# Patient Record
Sex: Male | Born: 1935 | Race: White | Hispanic: No | Marital: Married | Smoking: Former smoker
Health system: Southern US, Community
[De-identification: ages and names within clinical notes are randomized; demographics above are authoritative.]

## PROBLEM LIST (undated history)

## (undated) DIAGNOSIS — Z973 Presence of spectacles and contact lenses: Secondary | ICD-10-CM

## (undated) DIAGNOSIS — I2510 Atherosclerotic heart disease of native coronary artery without angina pectoris: Secondary | ICD-10-CM

## (undated) DIAGNOSIS — I493 Ventricular premature depolarization: Secondary | ICD-10-CM

## (undated) DIAGNOSIS — F0390 Unspecified dementia without behavioral disturbance: Secondary | ICD-10-CM

## (undated) DIAGNOSIS — R399 Unspecified symptoms and signs involving the genitourinary system: Secondary | ICD-10-CM

## (undated) DIAGNOSIS — E782 Mixed hyperlipidemia: Secondary | ICD-10-CM

## (undated) DIAGNOSIS — I4510 Unspecified right bundle-branch block: Secondary | ICD-10-CM

## (undated) DIAGNOSIS — N400 Enlarged prostate without lower urinary tract symptoms: Secondary | ICD-10-CM

## (undated) DIAGNOSIS — E039 Hypothyroidism, unspecified: Secondary | ICD-10-CM

## (undated) DIAGNOSIS — H409 Unspecified glaucoma: Secondary | ICD-10-CM

## (undated) DIAGNOSIS — R195 Other fecal abnormalities: Secondary | ICD-10-CM

## (undated) DIAGNOSIS — M1990 Unspecified osteoarthritis, unspecified site: Secondary | ICD-10-CM

## (undated) DIAGNOSIS — I252 Old myocardial infarction: Secondary | ICD-10-CM

## (undated) DIAGNOSIS — I10 Essential (primary) hypertension: Secondary | ICD-10-CM

## (undated) HISTORY — PX: TONSILLECTOMY: SUR1361

## (undated) HISTORY — DX: Essential (primary) hypertension: I10

## (undated) HISTORY — DX: Benign prostatic hyperplasia without lower urinary tract symptoms: N40.0

## (undated) HISTORY — DX: Mixed hyperlipidemia: E78.2

## (undated) HISTORY — PX: CATARACT EXTRACTION W/ INTRAOCULAR LENS  IMPLANT, BILATERAL: SHX1307

## (undated) HISTORY — PX: CARDIOVASCULAR STRESS TEST: SHX262

## (undated) HISTORY — PX: CARDIAC CATHETERIZATION: SHX172

---
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
## 2007-06-19 ENCOUNTER — Encounter: Payer: Self-pay | Admitting: Cardiology

## 2007-07-20 ENCOUNTER — Ambulatory Visit: Payer: Self-pay | Admitting: Cardiology

## 2007-07-31 ENCOUNTER — Ambulatory Visit: Payer: Self-pay | Admitting: Cardiology

## 2007-08-18 ENCOUNTER — Ambulatory Visit: Payer: Self-pay | Admitting: Cardiology

## 2007-08-21 ENCOUNTER — Encounter: Payer: Self-pay | Admitting: Cardiology

## 2007-08-23 ENCOUNTER — Ambulatory Visit: Payer: Self-pay | Admitting: Cardiovascular Disease

## 2007-08-23 ENCOUNTER — Inpatient Hospital Stay (HOSPITAL_BASED_OUTPATIENT_CLINIC_OR_DEPARTMENT_OTHER): Admission: RE | Admit: 2007-08-23 | Discharge: 2007-08-23 | Payer: Self-pay | Admitting: Cardiology

## 2007-09-12 ENCOUNTER — Ambulatory Visit: Payer: Self-pay | Admitting: Cardiology

## 2008-10-23 DIAGNOSIS — R9439 Abnormal result of other cardiovascular function study: Secondary | ICD-10-CM | POA: Insufficient documentation

## 2008-10-23 DIAGNOSIS — R072 Precordial pain: Secondary | ICD-10-CM | POA: Insufficient documentation

## 2008-10-23 DIAGNOSIS — R0602 Shortness of breath: Secondary | ICD-10-CM | POA: Insufficient documentation

## 2008-10-24 ENCOUNTER — Encounter (INDEPENDENT_AMBULATORY_CARE_PROVIDER_SITE_OTHER): Payer: Self-pay | Admitting: *Deleted

## 2010-07-14 NOTE — Assessment & Plan Note (Signed)
[facility]                          [facility] NOTE   NAME:Kurt French, Kurt French                      MRN:          [id number]  DATE:09/12/2007                            DOB:          April 14, 1932    PRIMARY CARE PHYSICIAN:  Doreen Beam, MD   REASON FOR VISIT:  Followup cardiac catheterization.   HISTORY OF PRESENT ILLNESS:  I saw [name] in mid June.  I referred  him for a diagnostic cardiac catheterization with a history of dyspnea  on exertion, functional limitation, and episodic chest discomfort.  He  had had an abnormal Cardiolite indicating possible inferolateral scar  with ischemia and ejection fraction of 53%.  Fortunately, his cardiac  catheterization actually looks quite good.  He had no significant  obstructive coronary artery disease and a normal left ventricular  ejection fraction of 65%.  His left ventricular end-diastolic pressure  was 20 mmHg.  I reviewed these results with him today and provided  reassurance.  He clearly needs continued risk factor modification, and I  have recommended that he continue his aspirin and treatment for  hypertension.  He is also fairly active at baseline, and we talked about  continuing this.  His chest x-ray did suggest some emphysematous  changes, although he had no defined history of tobacco use.  My  understanding is that he had pulmonary function tests ordered by Dr.  Sherril Croon demonstrating mild COPD.  This may have some bearing on his  symptoms, and he also indicates a propensity to allergies which may also  be related.   ALLERGIES:  No known drug allergies.   PRESENT MEDICATIONS:  1. Aspirin 325 mg p.o. daily.  2. Multivitamin 1 p.o. daily.  3. Lisinopril/HCTZ 20/12.5 mg p.o. daily.   REVIEW OF SYSTEMS:  As per history of present illness.  Otherwise  negative.   PHYSICAL EXAMINATION:  VITAL SIGNS:  Blood pressure is 131/89, heart  rate is 61, and weight is 203 pounds.  GENERAL:  The  patient is comfortable and in no acute distress.  HEENT:  Conjunctiva is normal.  Oropharynx is clear.  NECK:  Supple.  No elevated jugular venous pressure.  LUNGS:  Clear without breathing.  CARDIAC:  Regular rate and rhythm.  No loud murmur or gallop.  EXTREMITIES:  No pitting edema or hematoma postcatheterization.   IMPRESSION AND RECOMMENDATIONS:  Dyspnea on exertion with subsequent  findings of no significant obstructive coronary artery disease at  catheterization and normal left ventricular systolic function with a  left ventricular end-diastolic pressure of 20 mmHg.  We would suggest  continuing risk factor modification and also medical therapy including  aspirin.  Would continue efforts at blood pressure control and also  follow lipids aiming for aggressive LDL control at least under 100.  I  have recommended that he continue regular exercise and maintain followup  with Dr. Sherril Croon.  There may be a pulmonary component to his  symptomatology.  If this does not clearly pan out to be the case, one  could consider a cardiopulmonary stress test.  At this point, cardiology  followup  will be p.r.n.     Jonelle Sidle, MD  Electronically Signed    SGM/MedQ  DD: 09/12/2007  DT: 09/13/2007  Job #: [id number]   cc:   Doreen Beam, MD

## 2010-07-14 NOTE — Cardiovascular Report (Signed)
NAMEALAMIN, MCCUISTON               ACCOUNT NO.:  [id number]   MEDICAL RECORD NO.:  [id number]          PATIENT TYPE:  OIB   LOCATION:  1962                         FACILITY:  [facility]   PHYSICIAN:  Veverly Fells. Excell Seltzer, MD  DATE OF BIRTH:  [birth year]   DATE OF PROCEDURE:  08/23/2007  DATE OF DISCHARGE:  08/23/2007                            CARDIAC CATHETERIZATION   PROCEDURE:  Left heart catheterization, selective coronary angiography,  and left ventricular angiography.   INDICATIONS:  Mr. Speigner is a 77 year old gentleman with chest  discomfort and exertional dyspnea.  He underwent a Myoview scan that  showed a large partially reversible apical and basal inferolateral wall  defect consistent with scar and ischemia.  He was referred for cardiac  cath.   Risks and indications of the procedure were reviewed with the patient  and informed consent was obtained.  The right groin was prepped, draped,  and anesthetized with 1% lidocaine.  Using the modified Seldinger  technique, a 4-French sheath was placed in the right femoral artery.  Standard 4-French Judkins catheters were used for coronary angiography  and left ventriculography.  Pullback across the aortic valve was done.  All catheter exchanges were performed over a guidewire.  There were no  immediate complications.  The patient tolerated the procedure well.   FINDINGS:  Aortic pressure 124/63 with a mean of 88, left ventricular  pressure is 128/20.   Left mainstem.  The left main is angiographically normal.  It bifurcates  into the LAD and left circumflex.   LAD.  The LAD is a large-caliber vessel that courses down and reaches  the LV apex.  It supplies two diagonal branches, first which is moderate-  sized, the second diagonal is a large vessel that is nearly the size of  the LAD.  There was mild nonobstructive plaque but no significant  disease seen throughout the LAD or diagonal branches.   The left circumflex.  The left  circumflex is a large vessel.  It courses  down and supplies a tiny first OM and a large second OM branch, also  supplies a posterolateral branch and a left PDA.  There is no  significant stenosis throughout the left circumflex system.   Right coronary artery.  The right coronary artery is small and  nondominant.  It supplies a single RV marginal branch and the vessel is  angiographically normal.   Left ventriculography.  LV function is normal.  The LVEF is estimated at  65%.  There is no significant mitral regurgitation.  There are no  regional wall motion abnormalities.   ASSESSMENT:  1. Essentially normal coronary arteries with no obstructive coronary      artery disease.  2. Normal left ventricular function.   PLAN:  Ongoing efforts at primary risk reduction.      Veverly Fells. Excell Seltzer, MD  Electronically Signed     MDC/MEDQ  D:  11/17/2007  T:  11/18/2007  Job:  [id number]

## 2010-07-14 NOTE — Assessment & Plan Note (Signed)
[facility]                          [facility]ICE NOTE   NAME:Upson, DEWELL MONNIER                      MRN:          [id number]  DATE:07/20/2007                            DOB:          24-Aug-1932    REQUESTING PHYSICIAN:  Doreen Beam, MD.   REASON FOR CONSULTATION:  Dyspnea on exertion and episodic chest pain.   HISTORY OF PRESENT ILLNESS:  Mr. Methot is a very pleasant 77 year old  gentleman with a history of hypertension and very remote history of  tobacco use.  He is retired from the Chiropractor business  and has been fairly active over his lifetime.  He has been a regular  runner, in fact has run several marathons (PR around 4:15) his last  being in 2003.  He continues to exercise at the [facility] walking on the  treadmill and also running approximately 3 miles outside at a time,  maybe 2 times a week.  He was evaluated at [facility] in the past  due to bradycardia which ultimately turned out to be due to the fact  that he was in fairly good shape.  It sounds as if he had an  echocardiogram and treadmill as well as a Holter monitor and had no  other need for further intervention.  He is describing a history of  dyspnea on exertion that became noticeable back in January of this year.  He states specifically with activities such as walking uphill on his  property or carrying wood 30 feet, he feels more short of breath than  usual.  Sometimes, he has a pressure in the right upper side of his  chest in the mornings, but this is not exertional.  Otherwise, he is  able to do his other typical exercises without limitation.  He had a  chest x-ray done recently which demonstrated mildly hyperinflated lungs  that were described as being consistent with emphysema, although no  other acute cardiopulmonary process was noted.  I note that he stopped  smoking cigarettes back in the 1960s and he has no problems with  wheezing or cough.  He  states that he is remodeling a house and is  around a lot of dust.  He had an echocardiogram obtained also recently  demonstrating a normal left ventricular ejection fraction of 60% with  mild mitral regurgitation and mildly thickened aortic valve, although  without any significant stenosis.  He otherwise is not having any major  complaints.  His electrocardiogram shows sinus bradycardia at 53 beats  per minute with nonspecific T-wave changes.   ALLERGIES:  NO KNOWN DRUG ALLERGIES.   MEDICATIONS:  1. Lisinopril/hydrochlorothiazide 25/12.5 mg p.o. daily.  2. He also uses glucosamine chondroitin.   PAST MEDICAL HISTORY:  As outlined above.  He is status post  appendectomy back in 1948.   REVIEW OF SYSTEMS:  As outlined above.  He has problems with previous  gastric ulcer, although not since the 1980s.  He does have arthritic  knee pain.  He has a history of anxiety and depression.  No  claudication.  Otherwise systems are negative.  FAMILY HISTORY:  Reviewed.  The patient's mother died in her 56s with  cancer.  The patient's father died in his late 47s following an  accident.  No obvious premature cardiovascular disease noted.   SOCIAL HISTORY:  The patient is retired from Chiropractor.  He has a remote tobacco use history, but quit in the 1960s.  Remote  history of recreational drug use in the 1970s.  No regular alcohol use.  Drinks 6 cups of coffee a day.  Exercises regularly.  He is divorced and  has 3  children.  He grew up in [place].   PHYSICAL EXAMINATION:  VITAL SIGNS:  Blood pressure 145/84, heart rate  is 56, weight is 209 pounds, oxygen saturation is 98% on room air.  GENERAL:  This is an overweight male in no acute distress.  HEENT:  Conjunctivae are normal.  Oropharynx is clear.  NECK:  Supple.  No elevated jugular venous pressure.  No loud bruits.  No thyromegaly is noted.  LUNGS:  Clear without labored breathing at rest.  CARDIAC:  Reveals a regular rate  and rhythm.  No pathologic murmurs.  No  S3 gallop or pericardial rub.  ABDOMEN:  Soft, nontender, normoactive bowel sounds.  No bruits.  EXTREMITIES:  Exhibit no frank pitting edema.  Distal pulses are 2+.  SKIN:  Warm and dry.  MUSCULOSKELETAL:  No kyphosis noted.  NEUROPSYCHIATRIC:  The patient is alert and oriented x3.  Affect is  appropriate.   IMPRESSION/RECOMMENDATIONS:  Dyspnea on exertion, noticeable at  increased levels of activity such as walking up an incline or carrying  logs.  Otherwise, typical activities do not provoke symptoms.  The  patient has been active in general over his lifetime and has been fit to  the level of running marathons on a regular basis, recently in 2003.  He  continues to exercise now without major limitations.  His  electrocardiogram shows sinus bradycardia with nonspecific T-wave  changes.  He does have a remote tobacco use history.  His chest x-ray  shows mild hyperinflation with the possibility of emphysema being  raised, although he is not having any frank wheezing, hypoxia on room  air or cough.  His recent echocardiogram demonstrates normal left  ventricular systolic function with no major valvular abnormalities.  We  discussed these issues today and options for further evaluation  including both noninvasive and invasive techniques.  At this point, our  plan will be to proceed with an exercise Cardiolite and then decide if  further testing is needed beyond this.  If his ischemic assessment is  reassuring, formal pulmonary function tests or a CPX might be the next  step.  I will have him come back over the next month for evaluation.     Jonelle Sidle, MD  Electronically Signed    SGM/MedQ  DD: 07/20/2007  DT: 07/20/2007  Job #: [id number]   cc:   Doreen Beam, MD

## 2010-07-14 NOTE — Assessment & Plan Note (Signed)
[facility]                          [facility]ICE NOTE   NAME:Kurt French, Kurt French                      MRN:          [id number]  DATE:08/18/2007                            DOB:          09-02-1932    PRIMARY CARE PHYSICIAN:  Doreen Beam, MD   REASON FOR VISIT:  Followup cardiac testing.   HISTORY OF PRESENT ILLNESS:  I just recently saw Kurt French back in  late May.  He presented at that time with a history of progressive  dyspnea on exertion as well as episodic chest discomfort.  He has been  fairly active in general.  In fact, has run marathons in the past, most  recently in 2003, and had noted a limitation in his functional capacity  in the setting of hypertension and a chest x-ray showing the possibility  of a mild emphysematous change with remote tobacco use in the 1960s.  I  referred him for an exercise Cardiolite, which revealed no diagnostic  electrocardiographic changes, an ejection fraction of 53%, and a large  partially reversible apical to basal inferolateral defect consistent  with scar and ischemia.  I reviewed these results with him today and we  discussed proceeding on to a diagnostic cardiac catheterization to  better understand his coronary anatomy and assess coronary  revascularization options.  We reviewed the potential risks and benefits  of this approach and he is in agreement to proceed.   ALLERGIES:  No known drug allergies.   CURRENT MEDICATIONS:  1. Aspirin 325 p.o. daily.  2. Multivitamin 1 p.o. daily.  3. Glucosamine/chondroitin p.r.n.  4. Lisinopril/HCTZ 20/12.5 mg p.o. daily.   REVIEW OF SYSTEMS:  As per history of present illness.   PHYSICAL EXAMINATION:  VITAL SIGNS:  Blood pressure is 129/81, heart  rate is 53, and weight is 202 pounds.  GENERAL:  Patient is comfortable and in no acute distress.  HEENT:  Conjunctival is normal.  Oropharynx is clear.  NECK:  Supple.  No elevated jugular venous pressure  or bruits.  LUNGS:  Clear without labored breathing. Somewhat diminished breath  sounds.  No wheezing.  CARDIAC:  Regular rate and rhythm.  No S3 gallop or pathologic murmur.  EXTREMITIES:  No significant pitting edemas.   IMPRESSION AND RECOMMENDATIONS:  Progressive history of dyspnea on  exertion, functional limitation, an episodic chest discomfort with an  abnormal Cardiolite indicating apparent inferolateral scar with  associated ischemia and an ejection fraction of 53%.  This is on a  baseline of hypertension and remote tobacco use.  Lipid status is not  known.  After reviewing the test results, we discussed proceeding on to  a diagnostic cardiac catheterization, which will be arranged next week  in our outpatient cardiac catheterization lab.  We reviewed the  potential risk and benefits and he agreed to proceed.  Baseline labs  will be obtained.  He had a recent chest x-ray done demonstrating  possible mild emphysematous changes/hyperinflation, but no other acute  change.     Jonelle Sidle, MD  Electronically Signed    SGM/MedQ  DD: 08/18/2007  DT:  08/19/2007  Job #: [id number]   cc:   Doreen Beam, MD

## 2012-11-06 ENCOUNTER — Encounter: Payer: Self-pay | Admitting: Cardiology

## 2012-12-07 ENCOUNTER — Encounter: Payer: Self-pay | Admitting: *Deleted

## 2012-12-07 ENCOUNTER — Encounter: Payer: Self-pay | Admitting: Cardiology

## 2012-12-08 ENCOUNTER — Ambulatory Visit (INDEPENDENT_AMBULATORY_CARE_PROVIDER_SITE_OTHER): Payer: Self-pay | Admitting: Cardiology

## 2012-12-08 ENCOUNTER — Encounter: Payer: Self-pay | Admitting: Cardiology

## 2012-12-08 VITALS — BP 117/78 | HR 56 | Ht 69.0 in | Wt 197.1 lb

## 2012-12-08 DIAGNOSIS — I10 Essential (primary) hypertension: Secondary | ICD-10-CM

## 2012-12-08 DIAGNOSIS — R072 Precordial pain: Secondary | ICD-10-CM

## 2012-12-08 DIAGNOSIS — R9439 Abnormal result of other cardiovascular function study: Secondary | ICD-10-CM

## 2012-12-08 MED ORDER — ASPIRIN EC 325 MG PO TBEC: 325.0000 mg | DELAYED_RELEASE_TABLET | Freq: Every day | ORAL | Status: DC

## 2012-12-08 MED ORDER — NITROGLYCERIN 0.4 MG SL SUBL: 0.4000 mg | SUBLINGUAL_TABLET | SUBLINGUAL | Status: DC | PRN

## 2012-12-08 NOTE — Assessment & Plan Note (Signed)
Resolved. No ACS by recent enzymes. Followup Cardiolite during hospitalization showed possible inferior scar, however no active ischemia with normal LVEF. He is not reporting any further exertional symptomatology. As noted above, plan will be medical therapy and observation. I have recommended a daily aspirin in addition to his Tenoretic, also provided prescription for nitroglycerin. We will see him back in the next 3 months, sooner if needed.

## 2012-12-08 NOTE — Patient Instructions (Addendum)
   Begin Aspirin 325mg  daily  Nitroglycerin as needed for severe chest pain only - new sent to pharm  Continue all other medications.   Follow up in  3 months

## 2012-12-08 NOTE — Progress Notes (Signed)
Clinical Summary Kurt French is a 79 y.o.male last seen in our office back in July 2009. He has a history of reassuring cardiac catheterization that year demonstrating no significant obstructive CAD. Record review finds hospitalization at [facility] in September, patient was seen by the [facility] practice in consultation. He underwent a followup exercise Cardiolite demonstrating no diagnostic ST segment abnormalities, fixed inferior defect suggestive of scar, no active ischemia, LVEF 64%. Not entirely clear what was decided in terms of management based on this study result in reviewing  the records.  He is here with significant other today. He denies having any further chest pain symptoms since discharge. His original episode occurred after doing yard work over a period of 3 days, beginning to develop chest tightness, also reportedly had a high blood pressure recording at home.  I reviewed the results of the stress test with the patient, discussed the implications. He had no active ischemia, possible inferior scar, although back in 2009 also had abnormal stress testing that was not corroborated by cardiac catheterization. We discussed options of either continuing medical therapy and observation, versus pursuing a cardiac catheterization. At this point he is most comfortable with observation.   No Known Allergies  Current Outpatient Prescriptions  Medication Sig Dispense Refill  . atenolol-chlorthalidone (TENORETIC) 100-25 MG per tablet Take 1 tablet by mouth daily.      Marland Kitchen aspirin EC 325 MG tablet Take 1 tablet (325 mg total) by mouth daily.      . nitroGLYCERIN (NITROSTAT) 0.4 MG SL tablet Place 1 tablet (0.4 mg total) under the tongue every 5 (five) minutes as needed for chest pain.  25 tablet  3   No current facility-administered medications for this visit.    Past Medical History  Diagnosis Date  . History of cardiac catheterization     No significant CAD 2009  . BPH (benign  prostatic hypertrophy)   . Mixed hyperlipidemia   . Essential hypertension, benign     Past Surgical History  Procedure Laterality Date  . Appendectomy  1948  . Tonsillectomy      Family History  Problem Relation Age of Onset  . Colon cancer Mother   . Other Father     MVA    Social History Kurt French reports that he quit smoking about 52 years ago. His smoking use included Cigarettes. He has a 30 pack-year smoking history. He does not have any smokeless tobacco history on file. Kurt French reports that he does not drink alcohol.  Review of Systems Head no palpitations or syncope. NYHA class II dyspnea. Good appetite. No orthopnea or PND. Otherwise negative.  Physical Examination Filed Vitals:   12/08/12 1347  BP: 117/78  Pulse: 56   Filed Weights   12/08/12 1347  Weight: 197 lb 1.9 oz (89.413 kg)   Patient appears comfortable at rest. HEENT: Conjunctiva and lids normal, oropharynx clear. Neck: Supple, no elevated JVP or carotid bruits, no thyromegaly. Lungs: Clear to auscultation, nonlabored breathing at rest. Cardiac: Regular rate and rhythm, no S3, soft systolic murmur, no pericardial rub. Abdomen: Soft, nontender, bowel sounds present. Extremities: No pitting edema, distal pulses 2+. Skin: Warm and dry. Musculoskeletal: No kyphosis. Neuropsychiatric: Alert and oriented x3, affect grossly appropriate.   Problem List and Plan   Precordial pain Resolved. No ACS by recent enzymes. Followup Cardiolite during hospitalization showed possible inferior scar, however no active ischemia with normal LVEF. He is not reporting any further exertional symptomatology. As noted above, plan  will be medical therapy and observation. I have recommended a daily aspirin in addition to his Tenoretic, also provided prescription for nitroglycerin. We will see him back in the next 3 months, sooner if needed.  ABNORMAL CV (STRESS) TEST Also noted 2009, cardiac catheterization at that  time did not demonstrate any significant CAD.  Essential hypertension, benign Blood pressure is normal today.    Jonelle Sidle, M.D., F.A.C.C.

## 2012-12-08 NOTE — Assessment & Plan Note (Signed)
Blood pressure is normal today. 

## 2012-12-08 NOTE — Assessment & Plan Note (Signed)
Also noted 2009, cardiac catheterization at that time did not demonstrate any significant CAD.

## 2013-03-02 ENCOUNTER — Telehealth: Payer: Self-pay

## 2013-03-02 NOTE — Telephone Encounter (Signed)
Ms. Clearance Coots called on behalf of patient stating patient is a self-pay and won't have insurance until 08/2013.  Patient is not wanting to keep appointment coming up on 04/25/13 at 1:20.  Her question was price of visits and seemed high.  Patient just now paying off 1st visit from Oct 2014. Advised to keep appointment, explained about discount offered however she is stating it is too much.  Advised I would share concerns with physician.

## 2013-03-08 ENCOUNTER — Ambulatory Visit: Payer: Self-pay | Admitting: Cardiology

## 2013-04-25 ENCOUNTER — Encounter: Payer: Self-pay | Admitting: Cardiology

## 2013-04-25 ENCOUNTER — Ambulatory Visit (INDEPENDENT_AMBULATORY_CARE_PROVIDER_SITE_OTHER): Payer: Self-pay | Admitting: Cardiology

## 2013-04-25 VITALS — BP 124/73 | HR 46 | Ht 69.0 in | Wt 211.8 lb

## 2013-04-25 DIAGNOSIS — R9439 Abnormal result of other cardiovascular function study: Secondary | ICD-10-CM

## 2013-04-25 DIAGNOSIS — I10 Essential (primary) hypertension: Secondary | ICD-10-CM

## 2013-04-25 NOTE — Progress Notes (Signed)
    Clinical Summary Kurt French is a 80 y.o.male last seen in October 2014. Kurt French is here with Kurt French wife today. States that Kurt French has had no chest pain or unusual shortness of breath in the interim. Seems to be tolerating Kurt French medicines well. Kurt French has not used any nitroglycerin.  We have been managing him medically. Exercise Cardiolite in September 2014 demonstrated possible inferior scar with no ischemia, LVEF 64%. With similar noninvasive testing back in 2009, cardiac catheterization did not confirm any obstructive CAD.   No Known Allergies  Current Outpatient Prescriptions  Medication Sig Dispense Refill  . aspirin EC 325 MG tablet Take 325 mg by mouth daily.      Marland Kitchen atenolol-chlorthalidone (TENORETIC) 100-25 MG per tablet Take 1 tablet by mouth daily.      . nitroGLYCERIN (NITROSTAT) 0.4 MG SL tablet Place 1 tablet (0.4 mg total) under the tongue every 5 (five) minutes as needed for chest pain.  25 tablet  3   No current facility-administered medications for this visit.    Past Medical History  Diagnosis Date  . History of cardiac catheterization     No significant CAD 2009  . BPH (benign prostatic hypertrophy)   . Mixed hyperlipidemia   . Essential hypertension, benign     Social History Kurt French reports that Kurt French quit smoking about 53 years ago. Kurt French smoking use included Cigarettes. Kurt French has a 30 pack-year smoking history. Kurt French does not have any smokeless tobacco history on file. Kurt French reports that Kurt French does not drink alcohol.  Review of Systems No dizziness or syncope. No orthopnea or PND. No claudication. Otherwise negative.  Physical Examination Filed Vitals:   04/25/13 1325  BP: 124/73  Pulse: 46   Filed Weights   04/25/13 1325  Weight: 211 lb 12.8 oz (96.072 kg)    Patient appears comfortable at rest.  HEENT: Conjunctiva and lids normal, oropharynx clear.  Neck: Supple, no elevated JVP or carotid bruits, no thyromegaly.  Lungs: Clear to auscultation, nonlabored  breathing at rest.  Cardiac: Regular rate and rhythm, no S3, soft systolic murmur, no pericardial rub.  Abdomen: Soft, nontender, bowel sounds present.  Extremities: No pitting edema, distal pulses 2+.    Problem List and Plan   ABNORMAL CV (STRESS) TEST Low risk as outlined above, prior history of no significant CAD by cardiac catheterization in 2009. Would continue medical therapy and observation for now.  Essential hypertension, benign Blood pressure is normal today.    Satira Sark, M.D., F.A.C.C.

## 2013-04-25 NOTE — Assessment & Plan Note (Signed)
Blood pressure is normal today. 

## 2013-04-25 NOTE — Assessment & Plan Note (Signed)
Low risk as outlined above, prior history of no significant CAD by cardiac catheterization in 2009. Would continue medical therapy and observation for now.

## 2013-04-25 NOTE — Patient Instructions (Signed)

## 2013-12-03 ENCOUNTER — Encounter (INDEPENDENT_AMBULATORY_CARE_PROVIDER_SITE_OTHER): Payer: Medicare Other | Admitting: Ophthalmology

## 2013-12-03 DIAGNOSIS — H35033 Hypertensive retinopathy, bilateral: Secondary | ICD-10-CM | POA: Diagnosis not present

## 2013-12-03 DIAGNOSIS — I10 Essential (primary) hypertension: Secondary | ICD-10-CM

## 2013-12-03 DIAGNOSIS — H35372 Puckering of macula, left eye: Secondary | ICD-10-CM | POA: Diagnosis not present

## 2013-12-03 DIAGNOSIS — H3531 Nonexudative age-related macular degeneration: Secondary | ICD-10-CM

## 2013-12-03 DIAGNOSIS — H43813 Vitreous degeneration, bilateral: Secondary | ICD-10-CM

## 2013-12-26 ENCOUNTER — Other Ambulatory Visit (HOSPITAL_COMMUNITY): Payer: Self-pay | Admitting: General Surgery

## 2013-12-26 ENCOUNTER — Ambulatory Visit (HOSPITAL_COMMUNITY)
Admission: RE | Admit: 2013-12-26 | Discharge: 2013-12-26 | Disposition: A | Discharge status: Home / Self Care | Payer: Medicare Other | Source: Ambulatory Visit | Attending: General Surgery | Admitting: General Surgery

## 2013-12-26 DIAGNOSIS — M179 Osteoarthritis of knee, unspecified: Secondary | ICD-10-CM | POA: Insufficient documentation

## 2013-12-26 DIAGNOSIS — T1490XA Injury, unspecified, initial encounter: Secondary | ICD-10-CM

## 2013-12-26 DIAGNOSIS — M25562 Pain in left knee: Secondary | ICD-10-CM | POA: Diagnosis present

## 2015-08-05 DIAGNOSIS — H401123 Primary open-angle glaucoma, left eye, severe stage: Secondary | ICD-10-CM | POA: Diagnosis not present

## 2015-08-12 DIAGNOSIS — R3129 Other microscopic hematuria: Secondary | ICD-10-CM | POA: Diagnosis not present

## 2015-08-12 DIAGNOSIS — R39198 Other difficulties with micturition: Secondary | ICD-10-CM | POA: Diagnosis not present

## 2015-09-08 DIAGNOSIS — Z79899 Other long term (current) drug therapy: Secondary | ICD-10-CM | POA: Diagnosis not present

## 2015-09-08 DIAGNOSIS — M79605 Pain in left leg: Secondary | ICD-10-CM | POA: Diagnosis not present

## 2015-09-08 DIAGNOSIS — Z87891 Personal history of nicotine dependence: Secondary | ICD-10-CM | POA: Diagnosis not present

## 2015-09-08 DIAGNOSIS — M25562 Pain in left knee: Secondary | ICD-10-CM | POA: Diagnosis not present

## 2015-09-08 DIAGNOSIS — I10 Essential (primary) hypertension: Secondary | ICD-10-CM | POA: Diagnosis not present

## 2015-09-11 DIAGNOSIS — H409 Unspecified glaucoma: Secondary | ICD-10-CM | POA: Diagnosis not present

## 2015-09-11 DIAGNOSIS — M1612 Unilateral primary osteoarthritis, left hip: Secondary | ICD-10-CM | POA: Diagnosis not present

## 2015-09-11 DIAGNOSIS — M1712 Unilateral primary osteoarthritis, left knee: Secondary | ICD-10-CM | POA: Diagnosis not present

## 2015-09-11 DIAGNOSIS — N400 Enlarged prostate without lower urinary tract symptoms: Secondary | ICD-10-CM | POA: Diagnosis not present

## 2015-09-11 DIAGNOSIS — E039 Hypothyroidism, unspecified: Secondary | ICD-10-CM | POA: Diagnosis not present

## 2015-09-12 DIAGNOSIS — M47896 Other spondylosis, lumbar region: Secondary | ICD-10-CM | POA: Diagnosis not present

## 2015-09-12 DIAGNOSIS — M1712 Unilateral primary osteoarthritis, left knee: Secondary | ICD-10-CM | POA: Diagnosis not present

## 2015-09-12 DIAGNOSIS — M1612 Unilateral primary osteoarthritis, left hip: Secondary | ICD-10-CM | POA: Diagnosis not present

## 2015-09-15 DIAGNOSIS — H401123 Primary open-angle glaucoma, left eye, severe stage: Secondary | ICD-10-CM | POA: Diagnosis not present

## 2015-09-15 DIAGNOSIS — H401113 Primary open-angle glaucoma, right eye, severe stage: Secondary | ICD-10-CM | POA: Diagnosis not present

## 2015-10-27 DIAGNOSIS — E559 Vitamin D deficiency, unspecified: Secondary | ICD-10-CM | POA: Diagnosis not present

## 2015-10-27 DIAGNOSIS — Z0000 Encounter for general adult medical examination without abnormal findings: Secondary | ICD-10-CM | POA: Diagnosis not present

## 2015-10-29 DIAGNOSIS — Z0001 Encounter for general adult medical examination with abnormal findings: Secondary | ICD-10-CM | POA: Diagnosis not present

## 2015-10-29 DIAGNOSIS — M1612 Unilateral primary osteoarthritis, left hip: Secondary | ICD-10-CM | POA: Diagnosis not present

## 2015-10-29 DIAGNOSIS — Z23 Encounter for immunization: Secondary | ICD-10-CM | POA: Diagnosis not present

## 2015-11-07 DIAGNOSIS — R931 Abnormal findings on diagnostic imaging of heart and coronary circulation: Secondary | ICD-10-CM | POA: Diagnosis not present

## 2015-11-07 DIAGNOSIS — M4806 Spinal stenosis, lumbar region: Secondary | ICD-10-CM | POA: Diagnosis not present

## 2015-11-07 DIAGNOSIS — M4696 Unspecified inflammatory spondylopathy, lumbar region: Secondary | ICD-10-CM | POA: Diagnosis not present

## 2015-11-07 DIAGNOSIS — R079 Chest pain, unspecified: Secondary | ICD-10-CM | POA: Diagnosis not present

## 2015-11-07 DIAGNOSIS — M5126 Other intervertebral disc displacement, lumbar region: Secondary | ICD-10-CM | POA: Diagnosis not present

## 2015-11-17 ENCOUNTER — Encounter: Payer: Self-pay | Admitting: Cardiovascular Disease

## 2015-11-17 ENCOUNTER — Ambulatory Visit (INDEPENDENT_AMBULATORY_CARE_PROVIDER_SITE_OTHER): Payer: Medicare Other | Admitting: Cardiovascular Disease

## 2015-11-17 ENCOUNTER — Ambulatory Visit (HOSPITAL_COMMUNITY)
Admission: RE | Admit: 2015-11-17 | Discharge: 2015-11-17 | Disposition: A | Discharge status: Home / Self Care | Payer: Medicare Other | Source: Ambulatory Visit | Attending: Cardiovascular Disease | Admitting: Cardiovascular Disease

## 2015-11-17 VITALS — BP 136/73 | HR 52 | Ht 65.0 in | Wt 219.0 lb

## 2015-11-17 DIAGNOSIS — I4891 Unspecified atrial fibrillation: Secondary | ICD-10-CM | POA: Diagnosis not present

## 2015-11-17 DIAGNOSIS — I10 Essential (primary) hypertension: Secondary | ICD-10-CM | POA: Diagnosis not present

## 2015-11-17 DIAGNOSIS — R0602 Shortness of breath: Secondary | ICD-10-CM | POA: Diagnosis not present

## 2015-11-17 DIAGNOSIS — R9439 Abnormal result of other cardiovascular function study: Secondary | ICD-10-CM

## 2015-11-17 NOTE — Progress Notes (Signed)
SUBJECTIVE: 82 yr old patient of Dr. Domenic Polite last seen by him in 2015. Has HTN and no significant CAD by cath on  08/23/2007.   Has been having progressive exertional dyspnea, and no pulmonary problems. PCP ordered a stress test. Pt denies having had a chest xray.  Underwent stress test (I do not have the ECG strips) which reportedly showed atrial fibrillation with a RBBB and PVC's. 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 There was mild reversibility in the inferior wall concerning for ischemia.  Labs BUN 22, creatinine 1.26, Hgb 14.3, TC 154, TG 278, HDL 35, LDL 64.  ECG performed in the office today which I personally interpreted demonstrated sinus rhythm with a nonspecific T wave abnormality and late R-wave progression. There was also a left anterior fascicular block.   Review of Systems: As per "subjective", otherwise negative.  No Known Allergies  Current Outpatient Prescriptions  Medication Sig Dispense Refill  . atenolol-chlorthalidone (TENORETIC) 100-25 MG per tablet Take 1 tablet by mouth daily.    . nitroGLYCERIN (NITROSTAT) 0.4 MG SL tablet Place 1 tablet (0.4 mg total) under the tongue every 5 (five) minutes as needed for chest pain. 25 tablet 3  . aspirin EC 325 MG tablet Take 325 mg by mouth daily.    . diclofenac (VOLTAREN) 75 MG EC tablet     . dorzolamide (TRUSOPT) 2 % ophthalmic solution     . finasteride (PROSCAR) 5 MG tablet     . latanoprost (XALATAN) 0.005 % ophthalmic solution     . levothyroxine (SYNTHROID, LEVOTHROID) 25 MCG tablet      No current facility-administered medications for this visit.     Past Medical History:  Diagnosis Date  . BPH (benign prostatic hypertrophy)   . Essential hypertension, benign   . History of cardiac catheterization    No significant CAD 2009  . Mixed hyperlipidemia     Past Surgical History:  Procedure Laterality Date  . APPENDECTOMY  1948  . TONSILLECTOMY      Social History   Social  History  . Marital status: Married    Spouse name: N/A  . Number of children: N/A  . Years of education: N/A   Occupational History  . Not on file.   Social History Main Topics  . Smoking status: Former Smoker    Packs/day: 1.00    Years: 30.00    Types: Cigarettes    Quit date: 03/01/1960  . Smokeless tobacco: Not on file  . Alcohol use No  . 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 Sexual activity: Not on file   Other Topics Concern  . Not on file   Social History Narrative  . No narrative on file     Vitals:   11/17/15 1619  BP: 136/73  Pulse: (!) 52  SpO2: 96%  Weight: 219 lb (99.3 kg)  Height: 5\' 5"  (1.651 m)    PHYSICAL EXAM General: NAD HEENT: Normal. Neck: No JVD, no thyromegaly. Lungs: Diminished throughout, no wheezes/rales. CV: Nondisplaced PMI.  Regular rate and rhythm, normal S1/S2, no S3/S4, no murmur. No pretibial or periankle edema.   Abdomen: Obese.  Neurologic: Alert and oriented.  Psych: Normal affect. Skin: Normal. Musculoskeletal: No gross deformities.    ECG: Most recent ECG reviewed.      ASSESSMENT AND PLAN: 1. DOE/abnormal stress test: Will arrange for coronary angiography. Will obtain chest xray. Continue  ASA and atenolol.  2. Atrial fibrillation: Will obtain ECG strips from stress test for personal review before deciding on medical management.  3. HTN: Controlled. No changes.  Dispo: fu after cath.  Time spent: 40 minutes, of which greater than 50% was spent reviewing symptoms, relevant blood tests and studies, and discussing management plan with the patient.   Kate Sable, M.D., F.A.C.C.

## 2015-11-18 ENCOUNTER — Other Ambulatory Visit: Payer: Self-pay | Admitting: Cardiovascular Disease

## 2015-11-18 ENCOUNTER — Telehealth: Payer: Self-pay

## 2015-11-18 DIAGNOSIS — R9439 Abnormal result of other cardiovascular function study: Secondary | ICD-10-CM

## 2015-11-18 DIAGNOSIS — I209 Angina pectoris, unspecified: Secondary | ICD-10-CM

## 2015-11-18 NOTE — Telephone Encounter (Signed)
LM with cath information and mailed cath instructions to pt's home

## 2015-11-27 ENCOUNTER — Encounter (HOSPITAL_COMMUNITY): Payer: Self-pay | Admitting: *Deleted

## 2015-11-27 ENCOUNTER — Encounter (HOSPITAL_COMMUNITY)
Admission: RE | Disposition: A | Discharge status: Home / Self Care | Payer: Self-pay | Source: Ambulatory Visit | Attending: Cardiovascular Disease

## 2015-11-27 ENCOUNTER — Ambulatory Visit (HOSPITAL_COMMUNITY)
Admission: RE | Admit: 2015-11-27 | Discharge: 2015-11-27 | Disposition: A | Discharge status: Home / Self Care | Payer: Medicare Other | Source: Ambulatory Visit | Attending: Cardiovascular Disease | Admitting: Cardiovascular Disease

## 2015-11-27 DIAGNOSIS — N400 Enlarged prostate without lower urinary tract symptoms: Secondary | ICD-10-CM | POA: Diagnosis not present

## 2015-11-27 DIAGNOSIS — Z87891 Personal history of nicotine dependence: Secondary | ICD-10-CM | POA: Insufficient documentation

## 2015-11-27 DIAGNOSIS — R9439 Abnormal result of other cardiovascular function study: Secondary | ICD-10-CM

## 2015-11-27 DIAGNOSIS — Z7982 Long term (current) use of aspirin: Secondary | ICD-10-CM | POA: Diagnosis not present

## 2015-11-27 DIAGNOSIS — I4891 Unspecified atrial fibrillation: Secondary | ICD-10-CM | POA: Insufficient documentation

## 2015-11-27 DIAGNOSIS — I209 Angina pectoris, unspecified: Secondary | ICD-10-CM

## 2015-11-27 DIAGNOSIS — I4510 Unspecified right bundle-branch block: Secondary | ICD-10-CM | POA: Insufficient documentation

## 2015-11-27 DIAGNOSIS — I2510 Atherosclerotic heart disease of native coronary artery without angina pectoris: Secondary | ICD-10-CM | POA: Insufficient documentation

## 2015-11-27 DIAGNOSIS — E782 Mixed hyperlipidemia: Secondary | ICD-10-CM | POA: Insufficient documentation

## 2015-11-27 DIAGNOSIS — I10 Essential (primary) hypertension: Secondary | ICD-10-CM | POA: Insufficient documentation

## 2015-11-27 HISTORY — PX: CARDIAC CATHETERIZATION: SHX172

## 2015-11-27 LAB — BASIC METABOLIC PANEL
Anion gap: 8 (ref 5–15)
BUN: 20 mg/dL (ref 6–20)
CALCIUM: 8.9 mg/dL (ref 8.9–10.3)
CO2: 26 mmol/L (ref 22–32)
Chloride: 105 mmol/L (ref 101–111)
Creatinine, Ser: 1.3 mg/dL — ABNORMAL HIGH (ref 0.61–1.24)
GFR calc Af Amer: 58 mL/min — ABNORMAL LOW (ref >60)
GFR, EST NON AFRICAN AMERICAN: 50 mL/min — AB (ref >60)
GLUCOSE: 104 mg/dL — AB (ref 65–99)
Potassium: 3.4 mmol/L — ABNORMAL LOW (ref 3.5–5.1)
SODIUM: 139 mmol/L (ref 135–145)

## 2015-11-27 LAB — CBC
HCT: 39.8 % (ref 39.0–52.0)
Hemoglobin: 13.6 g/dL (ref 13.0–17.0)
MCH: 30.3 pg (ref 26.0–34.0)
MCHC: 34.2 g/dL (ref 30.0–36.0)
MCV: 88.6 fL (ref 78.0–100.0)
PLATELETS: 292 10*3/uL (ref 150–400)
RBC: 4.49 MIL/uL (ref 4.22–5.81)
RDW: 13.4 % (ref 11.5–15.5)
WBC: 9 10*3/uL (ref 4.0–10.5)

## 2015-11-27 LAB — PROTIME-INR
INR: 0.94
PROTHROMBIN TIME: 12.5 s (ref 11.4–15.2)

## 2015-11-27 SURGERY — LEFT HEART CATH AND CORONARY ANGIOGRAPHY

## 2015-11-27 MED ORDER — SODIUM CHLORIDE 0.9 % IV SOLN: 250.0000 mL | INTRAVENOUS | Status: DC | PRN

## 2015-11-27 MED ORDER — HEPARIN SODIUM (PORCINE) 1000 UNIT/ML IJ SOLN
INTRAMUSCULAR | Status: AC
Start: 2015-11-27 — End: 2015-11-27
  Filled 2015-11-27: qty 1

## 2015-11-27 MED ORDER — LIDOCAINE HCL (PF) 1 % IJ SOLN
INTRAMUSCULAR | Status: DC | PRN
  Administered 2015-11-27: 2 mL

## 2015-11-27 MED ORDER — HEPARIN (PORCINE) IN NACL 2-0.9 UNIT/ML-% IJ SOLN
INTRAMUSCULAR | Status: AC
  Filled 2015-11-27: qty 500

## 2015-11-27 MED ORDER — HEPARIN (PORCINE) IN NACL 2-0.9 UNIT/ML-% IJ SOLN
INTRAMUSCULAR | Status: DC | PRN
  Administered 2015-11-27: 1500 mL

## 2015-11-27 MED ORDER — SODIUM CHLORIDE 0.9 % IV SOLN: INTRAVENOUS | Status: AC

## 2015-11-27 MED ORDER — MIDAZOLAM HCL 2 MG/2ML IJ SOLN
INTRAMUSCULAR | Status: AC
  Filled 2015-11-27: qty 2

## 2015-11-27 MED ORDER — ASPIRIN 81 MG PO CHEW: 81.0000 mg | CHEWABLE_TABLET | ORAL | Status: DC

## 2015-11-27 MED ORDER — SODIUM CHLORIDE 0.9 % WEIGHT BASED INFUSION
3.0000 mL/kg/h | INTRAVENOUS | Status: AC
  Administered 2015-11-27: 3 mL/kg/h via INTRAVENOUS

## 2015-11-27 MED ORDER — FENTANYL CITRATE (PF) 100 MCG/2ML IJ SOLN
INTRAMUSCULAR | Status: AC
Start: 2015-11-27 — End: 2015-11-27
  Filled 2015-11-27: qty 2

## 2015-11-27 MED ORDER — SODIUM CHLORIDE 0.9% FLUSH: 3.0000 mL | Freq: Two times a day (BID) | INTRAVENOUS | Status: DC

## 2015-11-27 MED ORDER — LIDOCAINE HCL (PF) 1 % IJ SOLN
INTRAMUSCULAR | Status: AC
  Filled 2015-11-27: qty 30

## 2015-11-27 MED ORDER — IOPAMIDOL (ISOVUE-370) INJECTION 76%
INTRAVENOUS | Status: DC | PRN
  Administered 2015-11-27: 80 mL via INTRA_ARTERIAL

## 2015-11-27 MED ORDER — HEPARIN (PORCINE) IN NACL 2-0.9 UNIT/ML-% IJ SOLN
INTRAMUSCULAR | Status: AC
  Filled 2015-11-27: qty 1000

## 2015-11-27 MED ORDER — SODIUM CHLORIDE 0.9 % WEIGHT BASED INFUSION: 1.0000 mL/kg/h | INTRAVENOUS | Status: DC

## 2015-11-27 MED ORDER — VERAPAMIL HCL 2.5 MG/ML IV SOLN
INTRAVENOUS | Status: AC
  Filled 2015-11-27: qty 2

## 2015-11-27 MED ORDER — FENTANYL CITRATE (PF) 100 MCG/2ML IJ SOLN
INTRAMUSCULAR | Status: DC | PRN
  Administered 2015-11-27: 25 ug via INTRAVENOUS

## 2015-11-27 MED ORDER — MIDAZOLAM HCL 2 MG/2ML IJ SOLN
INTRAMUSCULAR | Status: DC | PRN
  Administered 2015-11-27: 1 mg via INTRAVENOUS

## 2015-11-27 MED ORDER — HEPARIN SODIUM (PORCINE) 1000 UNIT/ML IJ SOLN
INTRAMUSCULAR | Status: DC | PRN
  Administered 2015-11-27: 5000 [IU] via INTRAVENOUS

## 2015-11-27 MED ORDER — SODIUM CHLORIDE 0.9% FLUSH: 3.0000 mL | INTRAVENOUS | Status: DC | PRN

## 2015-11-27 MED ORDER — VERAPAMIL HCL 2.5 MG/ML IV SOLN
INTRAVENOUS | Status: DC | PRN
  Administered 2015-11-27: 10 mL via INTRA_ARTERIAL

## 2015-11-27 SURGICAL SUPPLY — 11 items
CATH IMPULSE 5F ANG/FL3.5 (CATHETERS) ×2 IMPLANT
DEVICE RAD COMP TR BAND LRG (VASCULAR PRODUCTS) ×2 IMPLANT
GLIDESHEATH SLEND A-KIT 6F 22G (SHEATH) IMPLANT
GLIDESHEATH SLEND SS 6F .021 (SHEATH) ×2 IMPLANT
KIT HEART LEFT (KITS) ×2 IMPLANT
PACK CARDIAC CATHETERIZATION (CUSTOM PROCEDURE TRAY) ×2 IMPLANT
SYR MEDRAD MARK V 150ML (SYRINGE) ×2 IMPLANT
TRANSDUCER W/STOPCOCK (MISCELLANEOUS) ×2 IMPLANT
TUBING CIL FLEX 10 FLL-RA (TUBING) ×2 IMPLANT
WIRE HI TORQ VERSACORE-J 145CM (WIRE) ×2 IMPLANT
WIRE SAFE-T 1.5MM-J .035X260CM (WIRE) ×2 IMPLANT

## 2015-11-27 NOTE — Discharge Instructions (Signed)
Radial Site Care °Refer to this sheet in the next few weeks. These instructions provide you with information about caring for yourself after your procedure. Your health care provider may also give you more specific instructions. Your treatment has been planned according to current medical practices, but problems sometimes occur. Call your health care provider if you have any problems or questions after your procedure. °WHAT TO EXPECT AFTER THE PROCEDURE °After your procedure, it is typical to have the following: °· Bruising at the radial site that usually fades within 1-2 weeks. °· Blood collecting in the tissue (hematoma) that may be painful to the touch. It should usually decrease in size and tenderness within 1-2 weeks. °HOME CARE INSTRUCTIONS °· Take medicines only as directed by your health care provider. °· You may shower 24-48 hours after the procedure or as directed by your health care provider. Remove the bandage (dressing) and gently wash the site with plain soap and water. Pat the area dry with a clean towel. Do not rub the site, because this may cause bleeding. °· Do not take baths, swim, or use a hot tub until your health care provider approves. °· Check your insertion site every day for redness, swelling, or drainage. °· Do not apply powder or lotion to the site. °· Do not flex or bend the affected arm for 24 hours or as directed by your health care provider. °· Do not push or pull heavy objects with the affected arm for 24 hours or as directed by your health care provider. °· Do not lift over 10 lb (4.5 kg) for 5 days after your procedure or as directed by your health care provider. °· Ask your health care provider when it is okay to: °¨ Return to work or school. °¨ Resume usual physical activities or sports. °¨ Resume sexual activity. °· Do not drive home if you are discharged the same day as the procedure. Have someone else drive you. °· You may drive 24 hours after the procedure unless otherwise  instructed by your health care provider. °· Do not operate machinery or power tools for 24 hours after the procedure. °· If your procedure was done as an outpatient procedure, which means that you went home the same day as your procedure, a responsible adult should be with you for the first 24 hours after you arrive home. °· Keep all follow-up visits as directed by your health care provider. This is important. °SEEK MEDICAL CARE IF: °· You have a fever. °· You have chills. °· You have increased bleeding from the radial site. Hold pressure on the site. °SEEK IMMEDIATE MEDICAL CARE IF: °· You have unusual pain at the radial site. °· You have redness, warmth, or swelling at the radial site. °· You have drainage (other than a small amount of blood on the dressing) from the radial site. °· The radial site is bleeding, and the bleeding does not stop after 30 minutes of holding steady pressure on the site. °· Your arm or hand becomes pale, cool, tingly, or numb. °  °This information is not intended to replace advice given to you by your health care provider. Make sure you discuss any questions you have with your health care provider. °  °Document Released: 03/20/2010 Document Revised: 03/08/2014 Document Reviewed: 09/03/2013 °Elsevier Interactive Patient Education ©2016 [employer] ° °

## 2015-11-27 NOTE — H&P (View-Only) (Signed)
SUBJECTIVE: 82 yr old patient of Dr. Domenic Polite last seen by him in 2015. Has HTN and no significant CAD by cath on  08/23/2007.   Has been having progressive exertional dyspnea, and no pulmonary problems. PCP ordered a stress test. Pt denies having had a chest xray.  Underwent stress test (I do not have the ECG strips) which reportedly showed atrial fibrillation with a RBBB and PVC's. 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 There was mild reversibility in the inferior wall concerning for ischemia.  Labs BUN 22, creatinine 1.26, Hgb 14.3, TC 154, TG 278, HDL 35, LDL 64.  ECG performed in the office today which I personally interpreted demonstrated sinus rhythm with a nonspecific T wave abnormality and late R-wave progression. There was also a left anterior fascicular block.   Review of Systems: As per "subjective", otherwise negative.  No Known Allergies  Current Outpatient Prescriptions  Medication Sig Dispense Refill  . atenolol-chlorthalidone (TENORETIC) 100-25 MG per tablet Take 1 tablet by mouth daily.    . nitroGLYCERIN (NITROSTAT) 0.4 MG SL tablet Place 1 tablet (0.4 mg total) under the tongue every 5 (five) minutes as needed for chest pain. 25 tablet 3  . aspirin EC 325 MG tablet Take 325 mg by mouth daily.    . diclofenac (VOLTAREN) 75 MG EC tablet     . dorzolamide (TRUSOPT) 2 % ophthalmic solution     . finasteride (PROSCAR) 5 MG tablet     . latanoprost (XALATAN) 0.005 % ophthalmic solution     . levothyroxine (SYNTHROID, LEVOTHROID) 25 MCG tablet      No current facility-administered medications for this visit.     Past Medical History:  Diagnosis Date  . BPH (benign prostatic hypertrophy)   . Essential hypertension, benign   . History of cardiac catheterization    No significant CAD 2009  . Mixed hyperlipidemia     Past Surgical History:  Procedure Laterality Date  . APPENDECTOMY  1948  . TONSILLECTOMY      Social History   Social  History  . Marital status: Married    Spouse name: N/A  . Number of children: N/A  . Years of education: N/A   Occupational History  . Not on file.   Social History Main Topics  . Smoking status: Former Smoker    Packs/day: 1.00    Years: 30.00    Types: Cigarettes    Quit date: 03/01/1960  . Smokeless tobacco: Not on file  . Alcohol use No  . 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 Sexual activity: Not on file   Other Topics Concern  . Not on file   Social History Narrative  . No narrative on file     Vitals:   11/17/15 1619  BP: 136/73  Pulse: (!) 52  SpO2: 96%  Weight: 219 lb (99.3 kg)  Height: 5\' 5"  (1.651 m)    PHYSICAL EXAM General: NAD HEENT: Normal. Neck: No JVD, no thyromegaly. Lungs: Diminished throughout, no wheezes/rales. CV: Nondisplaced PMI.  Regular rate and rhythm, normal S1/S2, no S3/S4, no murmur. No pretibial or periankle edema.   Abdomen: Obese.  Neurologic: Alert and oriented.  Psych: Normal affect. Skin: Normal. Musculoskeletal: No gross deformities.    ECG: Most recent ECG reviewed.      ASSESSMENT AND PLAN: 1. DOE/abnormal stress test: Will arrange for coronary angiography. Will obtain chest xray. Continue  ASA and atenolol.  2. Atrial fibrillation: Will obtain ECG strips from stress test for personal review before deciding on medical management.  3. HTN: Controlled. No changes.  Dispo: fu after cath.  Time spent: 40 minutes, of which greater than 50% was spent reviewing symptoms, relevant blood tests and studies, and discussing management plan with the patient.   Kate Sable, M.D., F.A.C.C.

## 2015-11-27 NOTE — Interval H&P Note (Signed)
History and Physical Interval Note:  11/27/2015 10:29 AM  Youlanda Roys Leece Sr. has presented today for cardiac cath with the diagnosis of abnormal stress test  The various methods of treatment have been discussed with the patient and family. After consideration of risks, benefits and other options for treatment, the patient has consented to  Procedure(s): Left Heart Cath and Coronary Angiography (N/A) as a surgical intervention .  The patient's history has been reviewed, patient examined, no change in status, stable for surgery.  I have reviewed the patient's chart and labs.  Questions were answered to the patient's satisfaction.    Cath Lab Visit (complete for each Cath Lab visit)  Clinical Evaluation Leading to the Procedure:   ACS: No.  Non-ACS:    Anginal Classification: CCS II  Anti-ischemic medical therapy: Minimal Therapy (1 class of medications)  Non-Invasive Test Results: Intermediate-risk stress test findings: cardiac mortality 1-3%/year  Prior CABG: No previous CABG         Lauree Chandler

## 2015-12-02 DIAGNOSIS — M5136 Other intervertebral disc degeneration, lumbar region: Secondary | ICD-10-CM | POA: Diagnosis not present

## 2015-12-09 ENCOUNTER — Ambulatory Visit: Payer: Self-pay | Admitting: Cardiology

## 2015-12-12 DIAGNOSIS — R05 Cough: Secondary | ICD-10-CM | POA: Diagnosis not present

## 2015-12-12 DIAGNOSIS — M4306 Spondylolysis, lumbar region: Secondary | ICD-10-CM | POA: Diagnosis not present

## 2015-12-12 DIAGNOSIS — R0600 Dyspnea, unspecified: Secondary | ICD-10-CM | POA: Diagnosis not present

## 2015-12-12 DIAGNOSIS — M545 Low back pain: Secondary | ICD-10-CM | POA: Diagnosis not present

## 2015-12-12 DIAGNOSIS — Z23 Encounter for immunization: Secondary | ICD-10-CM | POA: Diagnosis not present

## 2015-12-12 DIAGNOSIS — R918 Other nonspecific abnormal finding of lung field: Secondary | ICD-10-CM | POA: Diagnosis not present

## 2015-12-18 ENCOUNTER — Encounter: Payer: Self-pay | Admitting: Cardiovascular Disease

## 2015-12-18 DIAGNOSIS — J984 Other disorders of lung: Secondary | ICD-10-CM | POA: Diagnosis not present

## 2015-12-18 DIAGNOSIS — I2510 Atherosclerotic heart disease of native coronary artery without angina pectoris: Secondary | ICD-10-CM | POA: Diagnosis not present

## 2015-12-18 DIAGNOSIS — R918 Other nonspecific abnormal finding of lung field: Secondary | ICD-10-CM | POA: Diagnosis not present

## 2016-01-03 DIAGNOSIS — M5136 Other intervertebral disc degeneration, lumbar region: Secondary | ICD-10-CM | POA: Diagnosis not present

## 2016-01-13 DIAGNOSIS — M25561 Pain in right knee: Secondary | ICD-10-CM | POA: Diagnosis not present

## 2016-01-13 DIAGNOSIS — G8929 Other chronic pain: Secondary | ICD-10-CM | POA: Diagnosis not present

## 2016-01-16 DIAGNOSIS — H401133 Primary open-angle glaucoma, bilateral, severe stage: Secondary | ICD-10-CM | POA: Diagnosis not present

## 2016-01-16 DIAGNOSIS — M4306 Spondylolysis, lumbar region: Secondary | ICD-10-CM | POA: Diagnosis not present

## 2016-01-16 DIAGNOSIS — H21562 Pupillary abnormality, left eye: Secondary | ICD-10-CM | POA: Diagnosis not present

## 2016-01-16 DIAGNOSIS — M1712 Unilateral primary osteoarthritis, left knee: Secondary | ICD-10-CM | POA: Diagnosis not present

## 2016-01-16 DIAGNOSIS — E039 Hypothyroidism, unspecified: Secondary | ICD-10-CM | POA: Diagnosis not present

## 2016-01-16 DIAGNOSIS — H04123 Dry eye syndrome of bilateral lacrimal glands: Secondary | ICD-10-CM | POA: Diagnosis not present

## 2016-01-16 DIAGNOSIS — R0600 Dyspnea, unspecified: Secondary | ICD-10-CM | POA: Diagnosis not present

## 2016-01-16 DIAGNOSIS — Z01818 Encounter for other preprocedural examination: Secondary | ICD-10-CM | POA: Diagnosis not present

## 2016-01-16 DIAGNOSIS — M21371 Foot drop, right foot: Secondary | ICD-10-CM | POA: Diagnosis not present

## 2016-01-30 ENCOUNTER — Ambulatory Visit: Payer: Self-pay | Admitting: Orthopedic Surgery

## 2016-02-12 ENCOUNTER — Encounter (HOSPITAL_COMMUNITY)
Admission: RE | Admit: 2016-02-12 | Discharge: 2016-02-12 | Disposition: A | Discharge status: Home / Self Care | Payer: Medicare Other | Source: Ambulatory Visit | Attending: Orthopedic Surgery | Admitting: Orthopedic Surgery

## 2016-02-12 ENCOUNTER — Encounter (HOSPITAL_COMMUNITY): Payer: Self-pay

## 2016-02-12 ENCOUNTER — Ambulatory Visit: Payer: Self-pay | Admitting: Orthopedic Surgery

## 2016-02-12 DIAGNOSIS — Z01812 Encounter for preprocedural laboratory examination: Secondary | ICD-10-CM | POA: Insufficient documentation

## 2016-02-12 DIAGNOSIS — M1712 Unilateral primary osteoarthritis, left knee: Secondary | ICD-10-CM | POA: Insufficient documentation

## 2016-02-12 HISTORY — DX: Hypothyroidism, unspecified: E03.9

## 2016-02-12 LAB — BASIC METABOLIC PANEL
ANION GAP: 8 (ref 5–15)
BUN: 20 mg/dL (ref 6–20)
CHLORIDE: 107 mmol/L (ref 101–111)
CO2: 27 mmol/L (ref 22–32)
Calcium: 8.7 mg/dL — ABNORMAL LOW (ref 8.9–10.3)
Creatinine, Ser: 1.26 mg/dL — ABNORMAL HIGH (ref 0.61–1.24)
GFR, EST NON AFRICAN AMERICAN: 52 mL/min — AB (ref >60)
Glucose, Bld: 109 mg/dL — ABNORMAL HIGH (ref 65–99)
POTASSIUM: 3.6 mmol/L (ref 3.5–5.1)
SODIUM: 142 mmol/L (ref 135–145)

## 2016-02-12 LAB — CBC
HCT: 37.7 % — ABNORMAL LOW (ref 39.0–52.0)
HEMOGLOBIN: 13.6 g/dL (ref 13.0–17.0)
MCH: 31.4 pg (ref 26.0–34.0)
MCHC: 36.1 g/dL — ABNORMAL HIGH (ref 30.0–36.0)
MCV: 87.1 fL (ref 78.0–100.0)
PLATELETS: 348 10*3/uL (ref 150–400)
RBC: 4.33 MIL/uL (ref 4.22–5.81)
RDW: 13 % (ref 11.5–15.5)
WBC: 8.8 10*3/uL (ref 4.0–10.5)

## 2016-02-12 LAB — SURGICAL PCR SCREEN
MRSA, PCR: NEGATIVE
STAPHYLOCOCCUS AUREUS: NEGATIVE

## 2016-02-12 LAB — ABO/RH: ABO/RH(D): O POS

## 2016-02-12 NOTE — H&P (Signed)
TOTAL KNEE ADMISSION H&P  Patient is being admitted for left total knee arthroplasty.  Subjective:  Chief Complaint:left knee pain.  HPI: Kurt Hepburn [facility] Sr[facility] 82 y.o. male, has a history of pain and functional disability in the left knee due to arthritis and has failed non-surgical conservative treatments for greater than 12 weeks to includeNSAID's and/or analgesics, corticosteriod injections, flexibility and strengthening excercises, use of assistive devices, weight reduction as appropriate and activity modification.  Onset of symptoms was gradual, starting 2 years ago with gradually worsening course since that time. The patient noted no past surgery on the left knee(s).  Patient currently rates pain in the left knee(s) at 10 out of 10 with activity. Patient has night pain, worsening of pain with activity and weight bearing, pain that interferes with activities of daily living, pain with passive range of motion, crepitus and joint swelling.  Patient has evidence of subchondral cysts, subchondral sclerosis, periarticular osteophytes and joint space narrowing by imaging studies.  There is no active infection.  Patient Active Problem List   Diagnosis Date Noted  . Abnormal stress test   . Essential hypertension, benign 12/08/2012  . DYSPNEA 10/23/2008  . Precordial pain 10/23/2008  . ABNORMAL CV (STRESS) TEST 10/23/2008   Past Medical History:  Diagnosis Date  . BPH (benign prostatic hypertrophy)   . Essential hypertension, benign   . History of cardiac catheterization    No significant CAD 2009  . Mixed hyperlipidemia     Past Surgical History:  Procedure Laterality Date  . APPENDECTOMY  1948  . CARDIAC CATHETERIZATION N/A 11/27/2015   Procedure: Left Heart Cath and Coronary Angiography;  Surgeon: Burnell Blanks, MD;  Location: [facility];  Service: Cardiovascular;  Laterality: N/A;  . TONSILLECTOMY       (Not in a hospital admission) No Known Allergies  Social  History  Substance Use Topics  . Smoking status: Former Smoker    Packs/day: 1.00    Years: 30.00    Types: Cigarettes    Quit date: 03/01/1960  . Smokeless tobacco: Not on file  . Alcohol use No    Family History  Problem Relation Age of Onset  . Colon cancer Mother   . Other Father     MVA     Review of Systems  Constitutional: Negative.   HENT: Negative.   Eyes: Negative.   Respiratory: Positive for shortness of breath.   Cardiovascular: Negative.   Gastrointestinal: Negative.   Genitourinary: Negative.   Musculoskeletal: Positive for back pain and joint pain.  Skin: Negative.   Neurological: Negative.   Endo/Heme/Allergies: Negative.   Psychiatric/Behavioral: Negative.     Objective:  Physical Exam  Vitals reviewed. Constitutional: He is oriented to person, place, and time. He appears well-developed and well-nourished.  HENT:  Head: Normocephalic and atraumatic.  Eyes: Conjunctivae and EOM are normal. Pupils are equal, round, and reactive to light.  Neck: Normal range of motion. Neck supple.  Cardiovascular: Normal rate, regular rhythm and intact distal pulses.   Respiratory: Effort normal. No respiratory distress.  GI: Soft. He exhibits no distension.  Genitourinary:  Genitourinary Comments: deferred  Musculoskeletal:       Left knee: He exhibits decreased range of motion, swelling and deformity. Tenderness found. Medial joint line and lateral joint line tenderness noted.  Neurological: He is alert and oriented to person, place, and time. He has normal reflexes.  Skin: Skin is warm and dry.  Psychiatric: He has a normal mood and affect.  His behavior is normal. Judgment and thought content normal.    Vital signs in last 24 hours: @VSRANGES @  Labs:   Estimated body mass index is 34.14 kg/m as calculated from the following:   Height as of 11/27/15: 5\' 7"  (1.702 m).   Weight as of 11/27/15: 98.9 kg (218 lb).   Imaging Review Plain radiographs demonstrate  severe degenerative joint disease of the left knee(s). The overall alignment issignificant varus. The bone quality appears to be adequate for age and reported activity level.  Assessment/Plan:  End stage arthritis, left knee   The patient history, physical examination, clinical judgment of the provider and imaging studies are consistent with end stage degenerative joint disease of the left knee(s) and total knee arthroplasty is deemed medically necessary. The treatment options including medical management, injection therapy arthroscopy and arthroplasty were discussed at length. The risks and benefits of total knee arthroplasty were presented and reviewed. The risks due to aseptic loosening, infection, stiffness, patella tracking problems, thromboembolic complications and other imponderables were discussed. The patient acknowledged the explanation, agreed to proceed with the plan and consent was signed. Patient is being admitted for inpatient treatment for surgery, pain control, PT, OT, prophylactic antibiotics, VTE prophylaxis, progressive ambulation and ADL's and discharge planning. The patient is planning to be discharged home with home health services

## 2016-02-12 NOTE — Patient Instructions (Addendum)
Kurt Nitta Mccalla Sr.  02/12/2016   Your procedure is scheduled on: Thursday 02/19/2016  Report to [facility] Main  Entrance take [place]  elevators to 3rd floor to  [place] at    245  PM.  Call this number if you have problems the morning of surgery [phone]   Remember: ONLY 1 PERSON MAY GO WITH YOU TO SHORT STAY TO GET  READY MORNING OF [place].    Do not eat food  :After Midnight.  MAY HAVE CLEAR LIQUIDS FROM MIDNIGHT UP UNTIL 1045 AM THEN NOTHING UNTIL AFTER SURGERY!       CLEAR LIQUID DIET   Foods Allowed                                                                     Foods Excluded  Coffee and tea, regular and decaf                             liquids that you cannot  Plain Jell-O in any flavor                                             see through such as: Fruit ices (not with fruit pulp)                                     milk, soups, orange juice  Iced Popsicles                                    All solid food Carbonated beverages, regular and diet                                    Cranberry, grape and apple juices Sports drinks like Gatorade Lightly seasoned clear broth or consume(fat free) Sugar, honey syrup  Sample Menu Breakfast                                Lunch                                     Supper Cranberry juice                    Beef broth                            Chicken broth Jell-O  Grape juice                           Apple juice Coffee or tea                        Jell-O                                      Popsicle                                                Coffee or tea                        Coffee or tea  _____________________________________________________________________     Take these medicines the morning of surgery with A SIP OF WATER: LEVOTHYROXINE (SYNTHROID), USE EYE DROPS                                 You may not have any metal on your  body including hair pins and              piercings  Do not wear jewelry, make-up, lotions, powders or perfumes, deodorant             Do not wear nail polish.  Do not shave  48 hours prior to surgery.              Men may shave face and neck.   Do not bring valuables to the hospital. [place].  Contacts, dentures or bridgework may not be worn into surgery.  Leave suitcase in the car. After surgery it may be brought to your room.                 Please read over the following fact sheets you were given: _____________________________________________________________________             [facility] - Preparing for Surgery Before surgery, you can play an important role.  Because skin is not sterile, your skin needs to be as free of germs as possible.  You can reduce the number of germs on your skin by washing with CHG (chlorahexidine gluconate) soap before surgery.  CHG is an antiseptic cleaner which kills germs and bonds with the skin to continue killing germs even after washing. Please DO NOT use if you have an allergy to CHG or antibacterial soaps.  If your skin becomes reddened/irritated stop using the CHG and inform your nurse when you arrive at Short Stay. Do not shave (including legs and underarms) for at least 48 hours prior to the first CHG shower.  You may shave your face/neck. Please follow these instructions carefully:  1.  Shower with CHG Soap the night before surgery and the  morning of Surgery.  2.  If you choose to wash your hair, wash your hair first as usual with your  normal  shampoo.  3.  After you shampoo, rinse your hair and body thoroughly to remove the  shampoo.  4.  Use CHG as you would any other liquid soap.  You can apply chg directly  to the skin and wash                       Gently with a scrungie or clean washcloth.  5.  Apply the CHG Soap to your body ONLY FROM THE NECK DOWN.   Do not use  on face/ open                           Wound or open sores. Avoid contact with eyes, ears mouth and genitals (private parts).                       Wash face,  Genitals (private parts) with your normal soap.             6.  Wash thoroughly, paying special attention to the area where your surgery  will be performed.  7.  Thoroughly rinse your body with warm water from the neck down.  8.  DO NOT shower/wash with your normal soap after using and rinsing off  the CHG Soap.                9.  Pat yourself dry with a clean towel.            10.  Wear clean pajamas.            11.  Place clean sheets on your bed the night of your first shower and do not  sleep with pets. Day of Surgery : Do not apply any lotions/deodorants the morning of surgery.  Please wear clean clothes to the hospital/surgery center.  FAILURE TO FOLLOW THESE INSTRUCTIONS MAY RESULT IN THE CANCELLATION OF YOUR SURGERY PATIENT SIGNATURE_________________________________  NURSE SIGNATURE__________________________________  ________________________________________________________________________   Kurt French  An incentive spirometer is a tool that can help keep your lungs clear and active. This tool measures how well you are filling your lungs with each breath. Taking long deep breaths may help reverse or decrease the chance of developing breathing (pulmonary) problems (especially infection) following:  A long period of time when you are unable to move or be active. BEFORE THE PROCEDURE   If the spirometer includes an indicator to show your best effort, your nurse or respiratory therapist will set it to a desired goal.  If possible, sit up straight or lean slightly forward. Try not to slouch.  Hold the incentive spirometer in an upright position. INSTRUCTIONS FOR USE  1. Sit on the edge of your bed if possible, or sit up as far as you can in bed or on a chair. 2. Hold the incentive spirometer in an upright  position. 3. Breathe out normally. 4. Place the mouthpiece in your mouth and seal your lips tightly around it. 5. Breathe in slowly and as deeply as possible, raising the piston or the ball toward the top of the column. 6. Hold your breath for 3-5 seconds or for as long as possible. Allow the piston or ball to fall to the bottom of the column. 7. Remove the mouthpiece from your mouth and breathe out normally. 8. Rest for a few seconds and repeat Steps 1 through 7 at least 10 times every 1-2 hours when you are awake. Take your time and take a few normal breaths between deep breaths. 9. The spirometer may include an indicator to  show your best effort. Use the indicator as a goal to work toward during each repetition. 10. After each set of 10 deep breaths, practice coughing to be sure your lungs are clear. If you have an incision (the cut made at the time of surgery), support your incision when coughing by placing a pillow or rolled up towels firmly against it. Once you are able to get out of bed, walk around indoors and cough well. You may stop using the incentive spirometer when instructed by your caregiver.  RISKS AND COMPLICATIONS  Take your time so you do not get dizzy or light-headed.  If you are in pain, you may need to take or ask for pain medication before doing incentive spirometry. It is harder to take a deep breath if you are having pain. AFTER USE  Rest and breathe slowly and easily.  It can be helpful to keep track of a log of your progress. Your caregiver can provide you with a simple table to help with this. If you are using the spirometer at home, follow these instructions: [place] IF:   You are having difficultly using the spirometer.  You have trouble using the spirometer as often as instructed.  Your pain medication is not giving enough relief while using the spirometer.  You develop fever of 100.5 F (38.1 C) or higher. SEEK IMMEDIATE MEDICAL CARE IF:    You cough up bloody sputum that had not been present before.  You develop fever of 102 F (38.9 C) or greater.  You develop worsening pain at or near the incision site. MAKE SURE YOU:   Understand these instructions.  Will watch your condition.  Will get help right away if you are not doing well or get worse. Document Released: 06/28/2006 Document Revised: 05/10/2011 Document Reviewed: 08/29/2006 Exit[employer] Patient Information 2014 [employer], [place].   ________________________________________________________________________  WHAT IS A BLOOD TRANSFUSION? Blood Transfusion Information  A transfusion is the replacement of blood or some of its parts. Blood is made up of multiple cells which provide different functions.  Red blood cells carry oxygen and are used for blood loss replacement.  White blood cells fight against infection.  Platelets control bleeding.  Plasma helps clot blood.  Other blood products are available for specialized needs, such as hemophilia or other clotting disorders. BEFORE THE TRANSFUSION  Who gives blood for transfusions?   Healthy volunteers who are fully evaluated to make sure their blood is safe. This is blood bank blood. Transfusion therapy is the safest it has ever been in the practice of medicine. Before blood is taken from a donor, a complete history is taken to make sure that person has no history of diseases nor engages in risky social behavior (examples are intravenous drug use or sexual activity with multiple partners). The donor's travel history is screened to minimize risk of transmitting infections, such as malaria. The donated blood is tested for signs of infectious diseases, such as HIV and hepatitis. The blood is then tested to be sure it is compatible with you in order to minimize the chance of a transfusion reaction. If you or a relative donates blood, this is often done in anticipation of surgery and is not appropriate for emergency  situations. It takes many days to process the donated blood. RISKS AND COMPLICATIONS Although transfusion therapy is very safe and saves many lives, the main dangers of transfusion include:   Getting an infectious disease.  Developing a transfusion reaction. This is an allergic reaction  to something in the blood you were given. Every precaution is taken to prevent this. The decision to have a blood transfusion has been considered carefully by your caregiver before blood is given. Blood is not given unless the benefits outweigh the risks. AFTER THE TRANSFUSION  Right after receiving a blood transfusion, you will usually feel much better and more energetic. This is especially true if your red blood cells have gotten low (anemic). The transfusion raises the level of the red blood cells which carry oxygen, and this usually causes an energy increase.  The nurse administering the transfusion will monitor you carefully for complications. HOME CARE INSTRUCTIONS  No special instructions are needed after a transfusion. You may find your energy is better. Speak with your caregiver about any limitations on activity for underlying diseases you may have. SEEK MEDICAL CARE IF:   Your condition is not improving after your transfusion.  You develop redness or irritation at the intravenous (IV) site. SEEK IMMEDIATE MEDICAL CARE IF:  Any of the following symptoms occur over the next 12 hours:  Shaking chills.  You have a temperature by mouth above 102 F (38.9 C), not controlled by medicine.  Chest, back, or muscle pain.  People around you feel you are not acting correctly or are confused.  Shortness of breath or difficulty breathing.  Dizziness and fainting.  You get a rash or develop hives.  You have a decrease in urine output.  Your urine turns a dark color or changes to pink, red, or brown. Any of the following symptoms occur over the next 10 days:  You have a temperature by mouth above  102 F (38.9 C), not controlled by medicine.  Shortness of breath.  Weakness after normal activity.  The white part of the eye turns yellow (jaundice).  You have a decrease in the amount of urine or are urinating less often.  Your urine turns a dark color or changes to pink, red, or brown. Document Released: 02/13/2000 Document Revised: 05/10/2011 Document Reviewed: 10/02/2007 [facility] Patient Information 2014 [place], [place].  _______________________________________________________________________

## 2016-02-12 NOTE — Progress Notes (Signed)
   02/12/16 1333  OBSTRUCTIVE SLEEP APNEA  Have you ever been diagnosed with sleep apnea through a sleep study? No  Do you snore loudly (loud enough to be heard through closed doors)?  0  Do you often feel tired, fatigued, or sleepy during the daytime (such as falling asleep during driving or talking to someone)? 1  Has anyone observed you stop breathing during your sleep? 0  Do you have, or are you being treated for high blood pressure? 1  BMI more than 35 kg/m2? 1  Age > 50 (1-yes) 1  Neck circumference greater than:Male 16 inches or larger, Male 17inches or larger? 1  Male Gender (Yes=1) 1  Obstructive Sleep Apnea Score 6  Score 5 or greater  Results sent to PCP

## 2016-02-12 NOTE — H&P (Deleted)
  The note originally documented on this encounter has been moved the the encounter in which it belongs.  

## 2016-02-13 ENCOUNTER — Other Ambulatory Visit (HOSPITAL_COMMUNITY): Payer: Self-pay | Admitting: Anesthesiology

## 2016-02-19 ENCOUNTER — Inpatient Hospital Stay (HOSPITAL_COMMUNITY): Payer: Medicare Other

## 2016-02-19 ENCOUNTER — Inpatient Hospital Stay (HOSPITAL_COMMUNITY): Payer: Medicare Other | Admitting: Anesthesiology

## 2016-02-19 ENCOUNTER — Inpatient Hospital Stay (HOSPITAL_COMMUNITY)
Admission: RE | Admit: 2016-02-19 | Discharge: 2016-02-21 | DRG: 470 | Disposition: A | Discharge status: Home Health | Payer: Medicare Other | Source: Ambulatory Visit | Attending: Orthopedic Surgery | Admitting: Orthopedic Surgery

## 2016-02-19 ENCOUNTER — Encounter (HOSPITAL_COMMUNITY): Payer: Self-pay | Admitting: *Deleted

## 2016-02-19 ENCOUNTER — Encounter (HOSPITAL_COMMUNITY)
Admission: RE | Disposition: A | Discharge status: Home Health | Payer: Self-pay | Source: Ambulatory Visit | Attending: Orthopedic Surgery

## 2016-02-19 DIAGNOSIS — Z79899 Other long term (current) drug therapy: Secondary | ICD-10-CM

## 2016-02-19 DIAGNOSIS — Z87891 Personal history of nicotine dependence: Secondary | ICD-10-CM

## 2016-02-19 DIAGNOSIS — E782 Mixed hyperlipidemia: Secondary | ICD-10-CM | POA: Diagnosis present

## 2016-02-19 DIAGNOSIS — R0600 Dyspnea, unspecified: Secondary | ICD-10-CM | POA: Diagnosis not present

## 2016-02-19 DIAGNOSIS — Z09 Encounter for follow-up examination after completed treatment for conditions other than malignant neoplasm: Secondary | ICD-10-CM

## 2016-02-19 DIAGNOSIS — E039 Hypothyroidism, unspecified: Secondary | ICD-10-CM | POA: Diagnosis present

## 2016-02-19 DIAGNOSIS — Z96652 Presence of left artificial knee joint: Secondary | ICD-10-CM | POA: Diagnosis not present

## 2016-02-19 DIAGNOSIS — M25562 Pain in left knee: Secondary | ICD-10-CM | POA: Diagnosis present

## 2016-02-19 DIAGNOSIS — I10 Essential (primary) hypertension: Secondary | ICD-10-CM | POA: Diagnosis not present

## 2016-02-19 DIAGNOSIS — N400 Enlarged prostate without lower urinary tract symptoms: Secondary | ICD-10-CM | POA: Diagnosis not present

## 2016-02-19 DIAGNOSIS — G8918 Other acute postprocedural pain: Secondary | ICD-10-CM | POA: Diagnosis not present

## 2016-02-19 DIAGNOSIS — M1712 Unilateral primary osteoarthritis, left knee: Principal | ICD-10-CM | POA: Diagnosis present

## 2016-02-19 DIAGNOSIS — Z471 Aftercare following joint replacement surgery: Secondary | ICD-10-CM | POA: Diagnosis not present

## 2016-02-19 HISTORY — PX: KNEE ARTHROPLASTY: SHX992

## 2016-02-19 LAB — TYPE AND SCREEN
ABO/RH(D): O POS
Antibody Screen: NEGATIVE

## 2016-02-19 SURGERY — ARTHROPLASTY, KNEE, TOTAL, USING IMAGELESS COMPUTER-ASSISTED NAVIGATION
Anesthesia: Monitor Anesthesia Care | Site: Knee | Laterality: Left

## 2016-02-19 MED ORDER — ACETAMINOPHEN 10 MG/ML IV SOLN
1000.0000 mg | INTRAVENOUS | Status: AC
  Administered 2016-02-19: 1000 mg via INTRAVENOUS
  Filled 2016-02-19: qty 100

## 2016-02-19 MED ORDER — ATENOLOL 25 MG PO TABS
100.0000 mg | ORAL_TABLET | Freq: Every day | ORAL | Status: DC
  Administered 2016-02-20 – 2016-02-21 (×2): 100 mg via ORAL
  Filled 2016-02-19 (×2): qty 4

## 2016-02-19 MED ORDER — SODIUM CHLORIDE 0.9 % IJ SOLN
INTRAMUSCULAR | Status: AC
  Filled 2016-02-19: qty 30

## 2016-02-19 MED ORDER — SODIUM CHLORIDE 0.9 % IV SOLN
INTRAVENOUS | Status: DC
  Administered 2016-02-19: 22:00:00 via INTRAVENOUS

## 2016-02-19 MED ORDER — HYDROMORPHONE HCL 1 MG/ML IJ SOLN: 0.5000 mg | INTRAMUSCULAR | Status: DC | PRN

## 2016-02-19 MED ORDER — ACETAMINOPHEN 650 MG RE SUPP: 650.0000 mg | Freq: Four times a day (QID) | RECTAL | Status: DC | PRN

## 2016-02-19 MED ORDER — CEFAZOLIN SODIUM-DEXTROSE 2-4 GM/100ML-% IV SOLN
INTRAVENOUS | Status: AC
  Filled 2016-02-19: qty 100

## 2016-02-19 MED ORDER — PHENOL 1.4 % MT LIQD
1.0000 | OROMUCOSAL | Status: DC | PRN
  Filled 2016-02-19: qty 177

## 2016-02-19 MED ORDER — CEFAZOLIN SODIUM-DEXTROSE 2-4 GM/100ML-% IV SOLN
2.0000 g | Freq: Four times a day (QID) | INTRAVENOUS | Status: AC
  Administered 2016-02-19 – 2016-02-20 (×2): 2 g via INTRAVENOUS
  Filled 2016-02-19 (×2): qty 100

## 2016-02-19 MED ORDER — BUPIVACAINE HCL (PF) 0.5 % IJ SOLN
INTRAMUSCULAR | Status: DC | PRN
  Administered 2016-02-19: 3 mL via INTRATHECAL

## 2016-02-19 MED ORDER — PROPOFOL 10 MG/ML IV BOLUS
INTRAVENOUS | Status: AC
  Filled 2016-02-19: qty 20

## 2016-02-19 MED ORDER — MIDAZOLAM HCL 2 MG/2ML IJ SOLN
INTRAMUSCULAR | Status: AC
  Filled 2016-02-19: qty 2

## 2016-02-19 MED ORDER — BUPIVACAINE HCL 0.25 % IJ SOLN
INTRAMUSCULAR | Status: DC | PRN
  Administered 2016-02-19: 30 mL

## 2016-02-19 MED ORDER — DEXAMETHASONE SODIUM PHOSPHATE 10 MG/ML IJ SOLN
10.0000 mg | Freq: Once | INTRAMUSCULAR | Status: AC
  Administered 2016-02-20: 10 mg via INTRAVENOUS
  Filled 2016-02-19: qty 1

## 2016-02-19 MED ORDER — METOCLOPRAMIDE HCL 5 MG PO TABS: 5.0000 mg | ORAL_TABLET | Freq: Three times a day (TID) | ORAL | Status: DC | PRN

## 2016-02-19 MED ORDER — KETOROLAC TROMETHAMINE 30 MG/ML IJ SOLN
INTRAMUSCULAR | Status: AC
  Filled 2016-02-19: qty 1

## 2016-02-19 MED ORDER — SODIUM CHLORIDE 0.9 % IJ SOLN
INTRAMUSCULAR | Status: DC | PRN
  Administered 2016-02-19: 30 mL

## 2016-02-19 MED ORDER — CHLORHEXIDINE GLUCONATE 4 % EX LIQD: 60.0000 mL | Freq: Once | CUTANEOUS | Status: DC

## 2016-02-19 MED ORDER — LATANOPROST 0.005 % OP SOLN
1.0000 [drp] | Freq: Every day | OPHTHALMIC | Status: DC
  Administered 2016-02-21: 1 [drp] via OPHTHALMIC
  Filled 2016-02-19 (×2): qty 2.5

## 2016-02-19 MED ORDER — PROPOFOL 500 MG/50ML IV EMUL
INTRAVENOUS | Status: DC | PRN
  Administered 2016-02-19: 75 ug/kg/min via INTRAVENOUS

## 2016-02-19 MED ORDER — ISOPROPYL ALCOHOL 70 % SOLN
Status: DC | PRN
  Administered 2016-02-19: 1 via TOPICAL

## 2016-02-19 MED ORDER — DEXAMETHASONE SODIUM PHOSPHATE 10 MG/ML IJ SOLN
INTRAMUSCULAR | Status: DC | PRN
Start: 2016-02-19 — End: 2016-02-19
  Administered 2016-02-19: 10 mg via INTRAVENOUS

## 2016-02-19 MED ORDER — ROPIVACAINE HCL 7.5 MG/ML IJ SOLN
INTRAMUSCULAR | Status: AC
  Filled 2016-02-19: qty 20

## 2016-02-19 MED ORDER — KETOROLAC TROMETHAMINE 30 MG/ML IJ SOLN
INTRAMUSCULAR | Status: DC | PRN
  Administered 2016-02-19: 30 mg

## 2016-02-19 MED ORDER — LEVOTHYROXINE SODIUM 25 MCG PO TABS
25.0000 ug | ORAL_TABLET | Freq: Every day | ORAL | Status: DC
  Administered 2016-02-20 – 2016-02-21 (×2): 25 ug via ORAL
  Filled 2016-02-19 (×2): qty 1

## 2016-02-19 MED ORDER — SODIUM CHLORIDE 0.9 % IR SOLN
Status: DC | PRN
  Administered 2016-02-19: 4000 mL

## 2016-02-19 MED ORDER — FENTANYL CITRATE (PF) 100 MCG/2ML IJ SOLN
INTRAMUSCULAR | Status: DC | PRN
  Administered 2016-02-19 (×4): 25 ug via INTRAVENOUS

## 2016-02-19 MED ORDER — ONDANSETRON HCL 4 MG/2ML IJ SOLN: 4.0000 mg | Freq: Four times a day (QID) | INTRAMUSCULAR | Status: DC | PRN

## 2016-02-19 MED ORDER — ONDANSETRON HCL 4 MG/2ML IJ SOLN
INTRAMUSCULAR | Status: AC
  Filled 2016-02-19: qty 2

## 2016-02-19 MED ORDER — MENTHOL 3 MG MT LOZG: 1.0000 | LOZENGE | OROMUCOSAL | Status: DC | PRN

## 2016-02-19 MED ORDER — DOCUSATE SODIUM 100 MG PO CAPS
100.0000 mg | ORAL_CAPSULE | Freq: Two times a day (BID) | ORAL | Status: DC
  Administered 2016-02-19 – 2016-02-21 (×4): 100 mg via ORAL
  Filled 2016-02-19 (×4): qty 1

## 2016-02-19 MED ORDER — ATENOLOL-CHLORTHALIDONE 100-25 MG PO TABS: 1.0000 | ORAL_TABLET | ORAL | Status: DC

## 2016-02-19 MED ORDER — DEXAMETHASONE SODIUM PHOSPHATE 10 MG/ML IJ SOLN
INTRAMUSCULAR | Status: AC
  Filled 2016-02-19: qty 1

## 2016-02-19 MED ORDER — CEFAZOLIN SODIUM-DEXTROSE 2-4 GM/100ML-% IV SOLN
2.0000 g | INTRAVENOUS | Status: AC
  Administered 2016-02-19: 2 g via INTRAVENOUS
  Filled 2016-02-19: qty 100

## 2016-02-19 MED ORDER — ONDANSETRON HCL 4 MG/2ML IJ SOLN
INTRAMUSCULAR | Status: DC | PRN
  Administered 2016-02-19: 4 mg via INTRAVENOUS

## 2016-02-19 MED ORDER — ROPIVACAINE HCL 7.5 MG/ML IJ SOLN
INTRAMUSCULAR | Status: DC | PRN
  Administered 2016-02-19: 20 mL via PERINEURAL

## 2016-02-19 MED ORDER — ACETAMINOPHEN 10 MG/ML IV SOLN
INTRAVENOUS | Status: AC
  Filled 2016-02-19: qty 100

## 2016-02-19 MED ORDER — POVIDONE-IODINE 10 % EX SWAB: 2.0000 "application " | Freq: Once | CUTANEOUS | Status: DC

## 2016-02-19 MED ORDER — KETOROLAC TROMETHAMINE 15 MG/ML IJ SOLN
7.5000 mg | Freq: Four times a day (QID) | INTRAMUSCULAR | Status: AC
  Administered 2016-02-19 – 2016-02-20 (×2): 7.5 mg via INTRAVENOUS
  Filled 2016-02-19 (×2): qty 1

## 2016-02-19 MED ORDER — FENTANYL CITRATE (PF) 100 MCG/2ML IJ SOLN
INTRAMUSCULAR | Status: AC
  Filled 2016-02-19: qty 2

## 2016-02-19 MED ORDER — METOCLOPRAMIDE HCL 5 MG/ML IJ SOLN: 5.0000 mg | Freq: Three times a day (TID) | INTRAMUSCULAR | Status: DC | PRN

## 2016-02-19 MED ORDER — DIPHENHYDRAMINE HCL 12.5 MG/5ML PO ELIX: 12.5000 mg | ORAL_SOLUTION | ORAL | Status: DC | PRN

## 2016-02-19 MED ORDER — NITROGLYCERIN 0.4 MG SL SUBL: 0.4000 mg | SUBLINGUAL_TABLET | SUBLINGUAL | Status: DC | PRN

## 2016-02-19 MED ORDER — ONDANSETRON HCL 4 MG PO TABS: 4.0000 mg | ORAL_TABLET | Freq: Four times a day (QID) | ORAL | Status: DC | PRN

## 2016-02-19 MED ORDER — METHOCARBAMOL 1000 MG/10ML IJ SOLN
500.0000 mg | Freq: Four times a day (QID) | INTRAVENOUS | Status: DC | PRN
  Administered 2016-02-19: 500 mg via INTRAVENOUS
  Filled 2016-02-19 (×2): qty 5

## 2016-02-19 MED ORDER — BUPIVACAINE HCL (PF) 0.25 % IJ SOLN
INTRAMUSCULAR | Status: AC
  Filled 2016-02-19: qty 30

## 2016-02-19 MED ORDER — PROPOFOL 10 MG/ML IV BOLUS
INTRAVENOUS | Status: AC
  Filled 2016-02-19: qty 40

## 2016-02-19 MED ORDER — CHLORTHALIDONE 25 MG PO TABS
25.0000 mg | ORAL_TABLET | Freq: Every day | ORAL | Status: DC
  Administered 2016-02-20 – 2016-02-21 (×2): 25 mg via ORAL
  Filled 2016-02-19 (×2): qty 1

## 2016-02-19 MED ORDER — FINASTERIDE 5 MG PO TABS
5.0000 mg | ORAL_TABLET | Freq: Every day | ORAL | Status: DC
  Administered 2016-02-20 – 2016-02-21 (×2): 5 mg via ORAL
  Filled 2016-02-19 (×2): qty 1

## 2016-02-19 MED ORDER — TRANEXAMIC ACID 1000 MG/10ML IV SOLN
1000.0000 mg | Freq: Once | INTRAVENOUS | Status: AC
  Administered 2016-02-19: 1000 mg via INTRAVENOUS
  Filled 2016-02-19: qty 10

## 2016-02-19 MED ORDER — ATENOLOL 100 MG PO TABS
100.0000 mg | ORAL_TABLET | Freq: Once | ORAL | Status: DC
  Filled 2016-02-19: qty 1

## 2016-02-19 MED ORDER — BUPIVACAINE HCL (PF) 0.5 % IJ SOLN
INTRAMUSCULAR | Status: AC
  Filled 2016-02-19: qty 30

## 2016-02-19 MED ORDER — HYDROMORPHONE HCL 1 MG/ML IJ SOLN: 0.2500 mg | INTRAMUSCULAR | Status: DC | PRN

## 2016-02-19 MED ORDER — METHOCARBAMOL 500 MG PO TABS
500.0000 mg | ORAL_TABLET | Freq: Four times a day (QID) | ORAL | Status: DC | PRN
  Administered 2016-02-21: 500 mg via ORAL
  Filled 2016-02-19: qty 1

## 2016-02-19 MED ORDER — MIDAZOLAM HCL 5 MG/5ML IJ SOLN
INTRAMUSCULAR | Status: DC | PRN
  Administered 2016-02-19 (×2): 1 mg via INTRAVENOUS

## 2016-02-19 MED ORDER — SENNA 8.6 MG PO TABS
2.0000 | ORAL_TABLET | Freq: Every day | ORAL | Status: DC
  Administered 2016-02-19 – 2016-02-20 (×2): 17.2 mg via ORAL
  Filled 2016-02-19 (×2): qty 2

## 2016-02-19 MED ORDER — HYDROCODONE-ACETAMINOPHEN 5-325 MG PO TABS: 1.0000 | ORAL_TABLET | ORAL | Status: DC | PRN

## 2016-02-19 MED ORDER — SODIUM CHLORIDE 0.9 % IV SOLN: INTRAVENOUS | Status: DC

## 2016-02-19 MED ORDER — ASPIRIN 81 MG PO CHEW
81.0000 mg | CHEWABLE_TABLET | Freq: Two times a day (BID) | ORAL | Status: DC
  Administered 2016-02-20 – 2016-02-21 (×3): 81 mg via ORAL
  Filled 2016-02-19 (×3): qty 1

## 2016-02-19 MED ORDER — TAMSULOSIN HCL 0.4 MG PO CAPS
0.4000 mg | ORAL_CAPSULE | Freq: Two times a day (BID) | ORAL | Status: DC
  Administered 2016-02-19 – 2016-02-21 (×4): 0.4 mg via ORAL
  Filled 2016-02-19 (×4): qty 1

## 2016-02-19 MED ORDER — DORZOLAMIDE HCL 2 % OP SOLN
1.0000 [drp] | Freq: Every day | OPHTHALMIC | Status: DC
  Administered 2016-02-20 – 2016-02-21 (×2): 1 [drp] via OPHTHALMIC
  Filled 2016-02-19 (×3): qty 10

## 2016-02-19 MED ORDER — LACTATED RINGERS IV SOLN
INTRAVENOUS | Status: DC
  Administered 2016-02-19 (×2): via INTRAVENOUS

## 2016-02-19 MED ORDER — FENTANYL CITRATE (PF) 100 MCG/2ML IJ SOLN
50.0000 ug | Freq: Once | INTRAMUSCULAR | Status: AC
  Administered 2016-02-19: 25 ug via INTRAVENOUS

## 2016-02-19 MED ORDER — SODIUM CHLORIDE 0.9 % IV SOLN
1000.0000 mg | INTRAVENOUS | Status: AC
  Administered 2016-02-19: 1000 mg via INTRAVENOUS
  Filled 2016-02-19: qty 1100

## 2016-02-19 MED ORDER — ALUM & MAG HYDROXIDE-SIMETH 200-200-20 MG/5ML PO SUSP: 30.0000 mL | ORAL | Status: DC | PRN

## 2016-02-19 MED ORDER — POLYETHYLENE GLYCOL 3350 17 G PO PACK: 17.0000 g | PACK | Freq: Every day | ORAL | Status: DC | PRN

## 2016-02-19 MED ORDER — ACETAMINOPHEN 325 MG PO TABS
650.0000 mg | ORAL_TABLET | Freq: Four times a day (QID) | ORAL | Status: DC | PRN
  Administered 2016-02-20 – 2016-02-21 (×3): 650 mg via ORAL
  Filled 2016-02-19 (×3): qty 2

## 2016-02-19 SURGICAL SUPPLY — 58 items
BAG ZIPLOCK 12X15 (MISCELLANEOUS) IMPLANT
BANDAGE ACE 4X5 VEL STRL LF (GAUZE/BANDAGES/DRESSINGS) ×2 IMPLANT
BANDAGE ACE 6X5 VEL STRL LF (GAUZE/BANDAGES/DRESSINGS) ×2 IMPLANT
BANDAGE ESMARK 6X9 LF (GAUZE/BANDAGES/DRESSINGS) ×1 IMPLANT
BLADE SAW RECIPROCATING 77.5 (BLADE) ×2 IMPLANT
BNDG ESMARK 6X9 LF (GAUZE/BANDAGES/DRESSINGS) ×2
CAPT KNEE TRIATH TK-4 ×2 IMPLANT
CHLORAPREP W/TINT 26ML (MISCELLANEOUS) ×4 IMPLANT
CUFF TOURN SGL QUICK 34 (TOURNIQUET CUFF) ×1
CUFF TRNQT CYL 34X4X40X1 (TOURNIQUET CUFF) ×1 IMPLANT
DECANTER SPIKE VIAL GLASS SM (MISCELLANEOUS) ×4 IMPLANT
DERMABOND ADVANCED (GAUZE/BANDAGES/DRESSINGS) ×1
DERMABOND ADVANCED .7 DNX12 (GAUZE/BANDAGES/DRESSINGS) ×1 IMPLANT
DRAPE SHEET LG 3/4 BI-LAMINATE (DRAPES) ×4 IMPLANT
DRAPE U-SHAPE 47X51 STRL (DRAPES) ×2 IMPLANT
DRSG AQUACEL AG ADV 3.5X10 (GAUZE/BANDAGES/DRESSINGS) ×2 IMPLANT
DRSG TEGADERM 4X4.75 (GAUZE/BANDAGES/DRESSINGS) IMPLANT
ELECT REM PT RETURN 9FT ADLT (ELECTROSURGICAL) ×2
ELECTRODE REM PT RTRN 9FT ADLT (ELECTROSURGICAL) ×1 IMPLANT
EVACUATOR 1/8 PVC DRAIN (DRAIN) IMPLANT
GAUZE SPONGE 4X4 12PLY STRL (GAUZE/BANDAGES/DRESSINGS) ×2 IMPLANT
GLOVE BIO SURGEON STRL SZ8.5 (GLOVE) ×4 IMPLANT
GLOVE BIOGEL PI IND STRL 8.5 (GLOVE) ×1 IMPLANT
GLOVE BIOGEL PI INDICATOR 8.5 (GLOVE) ×1
GOWN SPEC L3 XXLG W/TWL (GOWN DISPOSABLE) ×2 IMPLANT
HANDPIECE INTERPULSE COAX TIP (DISPOSABLE) ×1
HOOD PEEL AWAY FLYTE STAYCOOL (MISCELLANEOUS) ×4 IMPLANT
MARKER SKIN DUAL TIP RULER LAB (MISCELLANEOUS) ×4 IMPLANT
NEEDLE SPNL 18GX3.5 QUINCKE PK (NEEDLE) ×2 IMPLANT
NS IRRIG 1000ML POUR BTL (IV SOLUTION) ×2 IMPLANT
PACK TOTAL KNEE CUSTOM (KITS) ×2 IMPLANT
PADDING CAST ABS 6INX4YD NS (CAST SUPPLIES) ×1
PADDING CAST ABS COTTON 6X4 NS (CAST SUPPLIES) ×1 IMPLANT
PADDING CAST COTTON 6X4 STRL (CAST SUPPLIES) ×2 IMPLANT
POSITIONER SURGICAL ARM (MISCELLANEOUS) ×2 IMPLANT
SAW OSC TIP CART 19.5X105X1.3 (SAW) ×2 IMPLANT
SEALER BIPOLAR AQUA 6.0 (INSTRUMENTS) ×2 IMPLANT
SET HNDPC FAN SPRY TIP SCT (DISPOSABLE) ×1 IMPLANT
SET PAD KNEE POSITIONER (MISCELLANEOUS) ×2 IMPLANT
SOL PREP POV-IOD 4OZ 10% (MISCELLANEOUS) ×2 IMPLANT
SPONGE DRAIN TRACH 4X4 STRL 2S (GAUZE/BANDAGES/DRESSINGS) IMPLANT
SPONGE LAP 18X18 X RAY DECT (DISPOSABLE) IMPLANT
SUCTION FRAZIER HANDLE 12FR (TUBING) ×1
SUCTION TUBE FRAZIER 12FR DISP (TUBING) ×1 IMPLANT
SUT MNCRL AB 3-0 PS2 18 (SUTURE) ×2 IMPLANT
SUT MON AB 2-0 CT1 36 (SUTURE) ×4 IMPLANT
SUT STRATAFIX PDO 1 14 VIOLET (SUTURE) ×1
SUT STRATFX PDO 1 14 VIOLET (SUTURE) ×1
SUT VIC AB 1 CT1 36 (SUTURE) ×6 IMPLANT
SUT VIC AB 2-0 CT1 27 (SUTURE) ×1
SUT VIC AB 2-0 CT1 TAPERPNT 27 (SUTURE) ×1 IMPLANT
SUTURE STRATFX PDO 1 14 VIOLET (SUTURE) ×1 IMPLANT
SYR 50ML LL SCALE MARK (SYRINGE) ×2 IMPLANT
TOWER CARTRIDGE SMART MIX (DISPOSABLE) IMPLANT
TRAY FOLEY W/METER SILVER 16FR (SET/KITS/TRAYS/PACK) IMPLANT
WATER STERILE IRR 1500ML POUR (IV SOLUTION) ×2 IMPLANT
WRAP KNEE MAXI GEL POST OP (GAUZE/BANDAGES/DRESSINGS) ×2 IMPLANT
YANKAUER SUCT BULB TIP 10FT TU (MISCELLANEOUS) ×2 IMPLANT

## 2016-02-19 NOTE — Anesthesia Preprocedure Evaluation (Addendum)
Anesthesia Evaluation  Patient identified by MRN, date of birth, ID band Patient awake    Reviewed: Allergy & Precautions, NPO status , Patient's Chart, lab work & pertinent test results, reviewed documented beta blocker date and time   Airway Mallampati: II  TM Distance: >3 FB Neck ROM: Full    Dental   Pulmonary former smoker,    breath sounds clear to auscultation       Cardiovascular hypertension, Pt. on medications and Pt. on home beta blockers  Rhythm:Regular Rate:Normal     Neuro/Psych negative neurological ROS     GI/Hepatic negative GI ROS, Neg liver ROS,   Endo/Other  Hypothyroidism   Renal/GU negative Renal ROS     Musculoskeletal  (+) Arthritis ,   Abdominal   Peds  Hematology negative hematology ROS (+)   Anesthesia Other Findings   Reproductive/Obstetrics                            Lab Results  Component Value Date   WBC 8.8 02/12/2016   HGB 13.6 02/12/2016   HCT 37.7 (L) 02/12/2016   MCV 87.1 02/12/2016   PLT 348 02/12/2016   Lab Results  Component Value Date   CREATININE 1.26 (H) 02/12/2016   BUN 20 02/12/2016   NA 142 02/12/2016   K 3.6 02/12/2016   CL 107 02/12/2016   CO2 27 02/12/2016   Lab Results  Component Value Date   INR 0.94 11/27/2015    Anesthesia Physical Anesthesia Plan  ASA: II  Anesthesia Plan: Spinal, Regional and MAC   Post-op Pain Management:  Regional for Post-op pain   Induction:   Airway Management Planned: Simple Face Mask and Natural Airway  Additional Equipment:   Intra-op Plan:   Post-operative Plan:   Informed Consent: I have reviewed the patients History and Physical, chart, labs and discussed the procedure including the risks, benefits and alternatives for the proposed anesthesia with the patient or authorized representative who has indicated his/her understanding and acceptance.     Plan Discussed with:  CRNA  Anesthesia Plan Comments:        Anesthesia Quick Evaluation

## 2016-02-19 NOTE — Discharge Instructions (Signed)
° °Dr. Shantoya Geurts °Total Joint Specialist °[facility] °[address] °[place], [place] [address] °[phone] ° °TOTAL KNEE REPLACEMENT POSTOPERATIVE DIRECTIONS ° ° ° °Knee Rehabilitation, Guidelines Following Surgery  °Results after knee surgery are often greatly improved when you follow the exercise, range of motion and muscle strengthening exercises prescribed by your doctor. Safety measures are also important to protect the knee from further injury. Any time any of these exercises cause you to have increased pain or swelling in your knee joint, decrease the amount until you are comfortable again and slowly increase them. If you have problems or questions, call your caregiver or physical therapist for advice.  ° °WEIGHT BEARING °Weight bearing as tolerated with assist device (walker, cane, etc) as directed, use it as long as suggested by your surgeon or therapist, typically at least 4-6 weeks. ° °HOME CARE INSTRUCTIONS  °Remove items at home which could result in a fall. This includes throw rugs or furniture in walking pathways.  °Continue medications as instructed at time of discharge. °You may have some home medications which will be placed on hold until you complete the course of blood thinner medication.  °You may start showering once you are discharged home but do not submerge the incision under water. Just pat the incision dry and apply a dry gauze dressing on daily. °Walk with walker as instructed.  °You may resume a sexual relationship in one month or when given the OK by your doctor.  °· Use walker as long as suggested by your caregivers. °· Avoid periods of inactivity such as sitting longer than an hour when not asleep. This helps prevent blood clots.  °You may put full weight on your legs and walk as much as is comfortable.  °You may return to work once you are cleared by your doctor.  °Do not drive a car for 6 weeks or until released by you surgeon.  °· Do not drive  while taking narcotics.  °Wear the elastic stockings for three weeks following surgery during the day but you may remove then at night. °Make sure you keep all of your appointments after your operation with all of your doctors and caregivers. You should call the office at the above phone number and make an appointment for approximately two weeks after the date of your surgery. °Do not remove your surgical dressing. The dressing is waterproof; you may take showers in 3 days, but do not take tub baths or submerge the dressing. °Please pick up a stool softener and laxative for home use as long as you are requiring pain medications. °· ICE to the affected knee every three hours for 30 minutes at a time and then as needed for pain and swelling.  Continue to use ice on the knee for pain and swelling from surgery. You may notice swelling that will progress down to the foot and ankle.  This is normal after surgery.  Elevate the leg when you are not up walking on it.   °It is important for you to complete the blood thinner medication as prescribed by your doctor. °· Continue to use the breathing machine which will help keep your temperature down.  It is common for your temperature to cycle up and down following surgery, especially at night when you are not up moving around and exerting yourself.  The breathing machine keeps your lungs expanded and your temperature down. ° °RANGE OF MOTION AND STRENGTHENING EXERCISES  °Rehabilitation of the knee is important following   a knee injury or an operation. After just a few days of immobilization, the muscles of the thigh which control the knee become weakened and shrink (atrophy). Knee exercises are designed to build up the tone and strength of the thigh muscles and to improve knee motion. Often times heat used for twenty to thirty minutes before working out will loosen up your tissues and help with improving the range of motion but do not use heat for the first two weeks following  surgery. These exercises can be done on a training (exercise) mat, on the floor, on a table or on a bed. Use what ever works the best and is most comfortable for you Knee exercises include:  °Leg Lifts - While your knee is still immobilized in a splint or cast, you can do straight leg raises. Lift the leg to 60 degrees, hold for 3 sec, and slowly lower the leg. Repeat 10-20 times 2-3 times daily. Perform this exercise against resistance later as your knee gets better.  °Quad and Hamstring Sets - Tighten up the muscle on the front of the thigh (Quad) and hold for 5-10 sec. Repeat this 10-20 times hourly. Hamstring sets are done by pushing the foot backward against an object and holding for 5-10 sec. Repeat as with quad sets.  °A rehabilitation program following serious knee injuries can speed recovery and prevent re-injury in the future due to weakened muscles. Contact your doctor or a physical therapist for more information on knee rehabilitation.  ° °SKILLED REHAB INSTRUCTIONS: °If the patient is transferred to a skilled rehab facility following release from the hospital, a list of the current medications will be sent to the facility for the patient to continue.  When discharged from the skilled rehab facility, please have the facility set up the patient's Home Health Physical Therapy prior to being released. Also, the skilled facility will be responsible for providing the patient with their medications at time of release from the facility to include their pain medication, the muscle relaxants, and their blood thinner medication. If the patient is still at the rehab facility at time of the two week follow up appointment, the skilled rehab facility will also need to assist the patient in arranging follow up appointment in our office and any transportation needs. ° °MAKE SURE YOU:  °Understand these instructions.  °Will watch your condition.  °Will get help right away if you are not doing well or get worse.   ° ° °Pick up stool softner and laxative for home use following surgery while on pain medications. °Do NOT remove your dressing. You may shower.  °Do not take tub baths or submerge incision under water. °May shower starting three days after surgery. °Please use a clean towel to pat the incision dry following showers. °Continue to use ice for pain and swelling after surgery. °Do not use any lotions or creams on the incision until instructed by your surgeon. ° °

## 2016-02-19 NOTE — Op Note (Signed)
OPERATIVE REPORT  SURGEON: Rod Can, MD   ASSISTANT: Staff.  PREOPERATIVE DIAGNOSIS: Left knee arthritis.   POSTOPERATIVE DIAGNOSIS: Left knee arthritis.   PROCEDURE: Left total knee arthroplasty.   IMPLANTS: Stryker Triathlon CR femur, size 6. Stryker Tritanium tibia, size 6. X3 polyethelyene insert, size 9 mm, CR. 3 button asymmetric patella, size 38 mm.  ANESTHESIA:  Regional and Spinal  TOURNIQUET TIME: Not utilized.   ESTIMATED BLOOD LOSS: 250 mL.  ANTIBIOTICS: 2 g Ancef.  DRAINS: None.  COMPLICATIONS: None   CONDITION: PACU - hemodynamically stable.   BRIEF CLINICAL NOTE: Kurt Fouty Barresi Sr. is a 82 y.o. male with a long-standing history of Left knee arthritis. After failing conservative management, the patient was indicated for total knee arthroplasty. The risks, benefits, and alternatives to the procedure were explained, and the patient elected to proceed.  PROCEDURE IN DETAIL: The surgical site was marked in the pre-op holding area. Once inside the operative room, spinal anesthesia was obtained, and a foley catheter was inserted. The patient was then positioned, a nonsterile tourniquet was placed, and the lower extremity was prepped and draped in the normal sterile surgical fashion. A time-out was called verifying side and site of surgery. The patient received IV antibiotics within 60 minutes of beginning the procedure. The tourniquet was not utilized.  An anterior approach to the knee was performed utilizing a mid vastus arthrotomy. A medial release was performed and the patellar fat pad was excised. Stryker navigation was used to cut the distal femur perpendicular to the mechanical axis. A freehand patellar resection was performed, and the patella was sized an prepared with 3 lug holes.  Nagivation was used to make a neutral proximal tibia resection, taking 8 mm  of bone from the less affected lateral side with 4 degrees of slope. The menisci were excised. A spacer block was placed, and the alignment and balance in extension were confirmed.   The distal femur was sized using the 3-degree external rotation guide referencing the posterior femoral cortex. The appropriate 4-in-1 cutting block was pinned into place. Rotation was checked using Whiteside's line, the epicondylar axis, and then confirmed with a spacer block in flexion. The remaining femoral cuts were performed, taking care to protect the MCL.  The tibia was sized and the trial tray was pinned into place. The remaining trail components were inserted. The knee was stable to varus and valgus stress through a full range of motion. The patella tracked centrally, and the PCL was well balanced. The trial components were removed, and the proximal tibial surface was prepared. Final components were impacted into place. The knee was tested for a final time and found to be well balanced.  The wound was copiously irrigated with a dilute betadine solution followed by normal saline with pulse lavage. Marcaine solution was injected into the periarticular soft tissue. The wound was closed in layers using #1 Vicryl and V-Loc for the fascia, 2-0 Vicryl for the subcutaneous fat, 2-0 Monocryl for the deep dermal layer, 3-0 running Monocryl subcuticular Stitch, and Dermabond for the skin. Once the glue was fully dried, an Aquacell Ag and compressive dressing were applied. Tthe patient was transported to the recovery room in stable condition. Sponge, needle, and instrument counts were correct at the end of the case x2. The patient tolerated the procedure well and there were no known complications.

## 2016-02-19 NOTE — Transfer of Care (Signed)
Immediate Anesthesia Transfer of Care Note  Patient: Kurt Ragans Mello Sr.  Procedure(s) Performed: Procedure(s): LEFT TOTAL KNEE ARTHROPLASTY WITH COMPUTER NAVIGATION (Left)  Patient Location: PACU  Anesthesia Type:Spinal  Level of Consciousness: awake and alert   Airway & Oxygen Therapy: Patient Spontanous Breathing and Patient connected to nasal cannula oxygen  Post-op Assessment: Report given to RN and Post -op Vital signs reviewed and stable  Post vital signs: Reviewed and stable  Last Vitals:  Vitals:   02/19/16 1607 02/19/16 1610  BP:  (!) 189/73  Pulse: (!) 43 (!) 44  Resp: 12 14  Temp:      Last Pain:  Vitals:   02/19/16 1349  TempSrc: Axillary      Patients Stated Pain Goal: 4 ([id number])  Complications: No apparent anesthesia complications

## 2016-02-19 NOTE — Progress Notes (Signed)
Atenolol held due to pulse 52 per protocol.Marland Kitchen

## 2016-02-19 NOTE — Anesthesia Postprocedure Evaluation (Signed)
Anesthesia Post Note  Patient: Kurt Bustillos Wohler Sr.  Procedure(s) Performed: Procedure(s) (LRB): LEFT TOTAL KNEE ARTHROPLASTY WITH COMPUTER NAVIGATION (Left)  Patient location during evaluation: PACU Anesthesia Type: Spinal and MAC Level of consciousness: awake and alert Pain management: pain level controlled Vital Signs Assessment: post-procedure vital signs reviewed and stable Respiratory status: spontaneous breathing and respiratory function stable Cardiovascular status: blood pressure returned to baseline and stable Postop Assessment: spinal receding Anesthetic complications: no       Last Vitals:  Vitals:   02/19/16 1915 02/19/16 1930  BP: 140/83 (!) 155/84  Pulse: (!) 57 (!) 57  Resp: 16 15  Temp: 36.3 C     Last Pain:  Vitals:   02/19/16 1915  TempSrc:   PainSc: 0-No pain                 Tiajuana Amass

## 2016-02-19 NOTE — Anesthesia Procedure Notes (Signed)
Anesthesia Regional Block:  Adductor canal block  Pre-Anesthetic Checklist: ,, timeout performed, Correct Patient, Correct Site, Correct Laterality, Correct Procedure, Correct Position, site marked, Risks and benefits discussed,  Surgical consent,  Pre-op evaluation,  At surgeon's request and post-op pain management  Laterality: Left  Prep: chloraprep       Needles:  Injection technique: Single-shot  Needle Type: Echogenic Needle     Needle Length: 9cm 9 cm Needle Gauge: 21 and 21 G    Additional Needles:  Procedures: ultrasound guided (picture in chart) Adductor canal block Narrative:  Start time: 02/19/2016 2:10 PM End time: 02/19/2016 2:17 PM Injection made incrementally with aspirations every 5 mL.  Performed by: Personally  Anesthesiologist: Suzette Battiest

## 2016-02-19 NOTE — Anesthesia Procedure Notes (Signed)
Spinal  Staffing Anesthesiologist: Suzette Battiest Performed: anesthesiologist  Preanesthetic Checklist Completed: patient identified, site marked, surgical consent, pre-op evaluation, timeout performed, IV checked, risks and benefits discussed and monitors and equipment checked Spinal Block Patient position: sitting Prep: site prepped and draped and DuraPrep Patient monitoring: blood pressure, continuous pulse ox and heart rate Approach: midline Location: L4-5 Injection technique: single-shot Needle Needle type: Sprotte  Needle gauge: 24 G Needle length: 9 cm

## 2016-02-20 ENCOUNTER — Encounter (HOSPITAL_COMMUNITY): Payer: Self-pay | Admitting: Orthopedic Surgery

## 2016-02-20 LAB — BASIC METABOLIC PANEL
Anion gap: 8 (ref 5–15)
BUN: 22 mg/dL — AB (ref 6–20)
CHLORIDE: 103 mmol/L (ref 101–111)
CO2: 24 mmol/L (ref 22–32)
Calcium: 8.3 mg/dL — ABNORMAL LOW (ref 8.9–10.3)
Creatinine, Ser: 1.3 mg/dL — ABNORMAL HIGH (ref 0.61–1.24)
GFR calc Af Amer: 58 mL/min — ABNORMAL LOW (ref >60)
GFR calc non Af Amer: 50 mL/min — ABNORMAL LOW (ref >60)
GLUCOSE: 226 mg/dL — AB (ref 65–99)
POTASSIUM: 3.3 mmol/L — AB (ref 3.5–5.1)
SODIUM: 135 mmol/L (ref 135–145)

## 2016-02-20 LAB — CBC
HCT: 35 % — ABNORMAL LOW (ref 39.0–52.0)
HEMOGLOBIN: 12.4 g/dL — AB (ref 13.0–17.0)
MCH: 31.3 pg (ref 26.0–34.0)
MCHC: 35.4 g/dL (ref 30.0–36.0)
MCV: 88.4 fL (ref 78.0–100.0)
Platelets: 332 10*3/uL (ref 150–400)
RBC: 3.96 MIL/uL — AB (ref 4.22–5.81)
RDW: 12.9 % (ref 11.5–15.5)
WBC: 21.4 10*3/uL — ABNORMAL HIGH (ref 4.0–10.5)

## 2016-02-20 MED ORDER — SENNA 8.6 MG PO TABS: 2.0000 | ORAL_TABLET | Freq: Every day | ORAL | 0 refills | Status: DC

## 2016-02-20 MED ORDER — METHOCARBAMOL 500 MG PO TABS: 500.0000 mg | ORAL_TABLET | Freq: Four times a day (QID) | ORAL | 0 refills | Status: DC | PRN

## 2016-02-20 MED ORDER — ALPRAZOLAM 0.5 MG PO TABS
0.5000 mg | ORAL_TABLET | Freq: Three times a day (TID) | ORAL | Status: DC | PRN
  Administered 2016-02-20 – 2016-02-21 (×3): 0.5 mg via ORAL
  Filled 2016-02-20 (×3): qty 1

## 2016-02-20 MED ORDER — HYDROCODONE-ACETAMINOPHEN 5-325 MG PO TABS: 1.0000 | ORAL_TABLET | ORAL | 0 refills | Status: DC | PRN

## 2016-02-20 MED ORDER — DOCUSATE SODIUM 100 MG PO CAPS: 100.0000 mg | ORAL_CAPSULE | Freq: Two times a day (BID) | ORAL | 0 refills | Status: DC

## 2016-02-20 MED ORDER — ASPIRIN 81 MG PO CHEW
81.0000 mg | CHEWABLE_TABLET | Freq: Two times a day (BID) | ORAL | 1 refills | Status: DC
Start: 2016-02-20 — End: 2017-01-26

## 2016-02-20 MED ORDER — ONDANSETRON HCL 4 MG PO TABS: 4.0000 mg | ORAL_TABLET | Freq: Four times a day (QID) | ORAL | 0 refills | Status: DC | PRN

## 2016-02-20 NOTE — Discharge Summary (Signed)
Physician Discharge Summary  Patient ID: Kurt Muzik [facility] Sr. MRN: [id number] DOB/AGE: 1932-07-30 82 y.o.  Admit date: 02/19/2016 Discharge date: 02/21/2016  Admission Diagnoses:  Degenerative arthritis of left knee  Discharge Diagnoses:  Principal Problem:   Degenerative arthritis of left knee   Past Medical History:  Diagnosis Date  . Arthritis   . BPH (benign prostatic hypertrophy)   . Dyspnea   . Essential hypertension, benign   . History of cardiac catheterization    No significant CAD 2009  . Hypothyroidism   . Mixed hyperlipidemia     Surgeries: Procedure(s): LEFT TOTAL KNEE ARTHROPLASTY WITH COMPUTER NAVIGATION on 02/19/2016   Consultants (if any):   Discharged Condition: Improved  Hospital Course: Kurt Mara [facility] Sr. is an 82 y.o. male who was admitted 02/19/2016 with a diagnosis of Degenerative arthritis of left knee and went to the operating room on 02/19/2016 and underwent the above named procedures.    He was given perioperative antibiotics:  Anti-infectives    Start     Dose/Rate Route Frequency Ordered Stop   02/19/16 2300  ceFAZolin (ANCEF) IVPB 2g/100 mL premix     2 g 200 mL/hr over 30 Minutes Intravenous Every 6 hours 02/19/16 2108 02/20/16 0601   02/19/16 1345  ceFAZolin (ANCEF) IVPB 2g/100 mL premix     2 g 200 mL/hr over 30 Minutes Intravenous On call to O.R. 02/19/16 1335 02/19/16 1626    .  He was given sequential compression devices, early ambulation, and ASA for DVT prophylaxis.  He benefited maximally from the hospital stay and there were no complications.    Recent vital signs:  Vitals:   02/20/16 2128 02/21/16 0500  BP: (!) 143/71 (!) 154/67  Pulse: 61 (!) 56  Resp: 20 20  Temp: 97.6 F (36.4 C) 97.6 F (36.4 C)    Recent laboratory studies:  Lab Results  Component Value Date   HGB 10.2 (L) 02/21/2016   HGB 12.4 (L) 02/20/2016   HGB 13.6 02/12/2016   Lab Results  Component Value Date   WBC 27.2 (H) 02/21/2016   PLT  289 02/21/2016   Lab Results  Component Value Date   INR 0.94 11/27/2015   Lab Results  Component Value Date   NA 135 02/20/2016   K 3.3 (L) 02/20/2016   CL 103 02/20/2016   CO2 24 02/20/2016   BUN 22 (H) 02/20/2016   CREATININE 1.30 (H) 02/20/2016   GLUCOSE 226 (H) 02/20/2016    Discharge Medications:   Allergies as of 02/21/2016   No Known Allergies     Medication List    TAKE these medications   aspirin 81 MG chewable tablet Chew 1 tablet (81 mg total) by mouth 2 (two) times daily.   atenolol-chlorthalidone 100-25 MG tablet Commonly known as:  TENORETIC Take 1 tablet by mouth every other day.   diclofenac 75 MG EC tablet Commonly known as:  VOLTAREN Take 75 mg by mouth 2 (two) times daily.   docusate sodium 100 MG capsule Commonly known as:  COLACE Take 1 capsule (100 mg total) by mouth 2 (two) times daily.   dorzolamide 2 % ophthalmic solution Commonly known as:  TRUSOPT Place 1 drop into both eyes daily.   finasteride 5 MG tablet Commonly known as:  PROSCAR Take 5 mg by mouth daily.   HYDROcodone-acetaminophen 5-325 MG tablet Commonly known as:  NORCO/VICODIN Take 1-2 tablets by mouth every 4 (four) hours as needed (breakthrough pain).   latanoprost 0.005 % ophthalmic  solution Commonly known as:  XALATAN Place 1 drop into both eyes at bedtime.   levothyroxine 25 MCG tablet Commonly known as:  SYNTHROID, LEVOTHROID Take 25 mcg by mouth daily before breakfast.   methocarbamol 500 MG tablet Commonly known as:  ROBAXIN Take 1 tablet (500 mg total) by mouth every 6 (six) hours as needed for muscle spasms.   nitroGLYCERIN 0.4 MG SL tablet Commonly known as:  NITROSTAT Place 1 tablet (0.4 mg total) under the tongue every 5 (five) minutes as needed for chest pain.   OCUVITE EXTRA PO Take 1 tablet by mouth daily.   ondansetron 4 MG tablet Commonly known as:  ZOFRAN Take 1 tablet (4 mg total) by mouth every 6 (six) hours as needed for nausea.    senna 8.6 MG Tabs tablet Commonly known as:  SENOKOT Take 2 tablets (17.2 mg total) by mouth at bedtime.   tamsulosin 0.4 MG Caps capsule Commonly known as:  FLOMAX Take 0.4 mg by mouth 2 (two) times daily.            Durable Medical Equipment        Start     Ordered   02/19/16 2109  DME Walker rolling  Once    Question:  Patient needs a walker to treat with the following condition  Answer:  H/O total knee replacement, left   02/19/16 2108   02/19/16 2109  DME 3 n 1  Once     02/19/16 2108   02/19/16 2109  DME Bedside commode  Once    Question:  Patient needs a bedside commode to treat with the following condition  Answer:  H/O total knee replacement, left   02/19/16 2108      Diagnostic Studies: Dg Knee Left Port  Result Date: 02/19/2016 CLINICAL DATA:  Postop knee replacement EXAM: PORTABLE LEFT KNEE - 1-2 VIEW COMPARISON:  09/08/2015 FINDINGS: Two-view portal exam the left knee shows the patient be status post tricompartmental knee replacement. No evidence for immediate hardware complications. Gas in the overlying soft tissues is compatible with the immediate postoperative state. IMPRESSION: Status post tricompartmental knee replacement without evidence for immediate hardware complications. Electronically Signed   By: Misty Stanley M.D.   On: 02/19/2016 19:51    Disposition: 01-Home or Self Care  Discharge Instructions    Call MD / Call 911    Complete by:  As directed    If you experience chest pain or shortness of breath, CALL 911 and be transported to the hospital emergency room.  If you develope a fever above 101 F, pus (white drainage) or increased drainage or redness at the wound, or calf pain, call your surgeon's office.   Constipation Prevention    Complete by:  As directed    Drink plenty of fluids.  Prune juice may be helpful.  You may use a stool softener, such as Colace (over the counter) 100 mg twice a day.  Use MiraLax (over the counter) for constipation  as needed.   Diet - low sodium heart healthy    Complete by:  As directed    Do not put a pillow under the knee. Place it under the heel.    Complete by:  As directed    Driving restrictions    Complete by:  As directed    No driving for 6 weeks   Increase activity slowly as tolerated    Complete by:  As directed    Lifting restrictions    Complete  by:  As directed    No lifting for 6 weeks   TED hose    Complete by:  As directed    Use stockings (TED hose) for 2 weeks on both leg(s).  You may remove them at night for sleeping.      Follow-up Information    Mathilda Maguire, Horald Pollen, MD. Schedule an appointment as soon as possible for a visit in 2 week(s).   Specialty:  Orthopedic Surgery Why:  For wound re-check Contact information: [address] [place] [address] [phone]        [facility] Follow up.   Why:  Physical therapy Contact information: [place] [address] [place] Follow up.   Why:  walker Contact information: [address] [place] [address] [phone]            Signed: Elie Goody 02/21/2016, 7:56 AM

## 2016-02-20 NOTE — Evaluation (Signed)
Occupational Therapy Evaluation Patient Details Name: Kurt French [facility] Sr. MRN: [id number] DOB: 04-Feb-1933 Today's Date: 02/20/2016    History of Present Illness s/p L TKA   Clinical Impression   This 82 year old man was admitted for the above sx.  He is HOH and was impulsive during evaluation.  He will benefit from continued OT in acute.    Follow Up Recommendations  Supervision/Assistance - 24 hour    Equipment Recommendations  None recommended by OT    Recommendations for Other Services       Precautions / Restrictions Precautions Precautions: Knee Restrictions Weight Bearing Restrictions: No LLE Weight Bearing: Weight bearing as tolerated      Mobility Bed Mobility               General bed mobility comments: oob  Transfers Overall transfer level: Needs assistance Equipment used: Rolling walker (2 wheeled) Transfers: Sit to/from Stand Sit to Stand: Min guard         General transfer comment: for safety; cues for backing up to chair/commode, UE/LE placement    Balance                                            ADL Overall ADL's : Needs assistance/impaired     Grooming: Min guard;Standing       Lower Body Bathing: Minimal assistance;Sit to/from stand       Lower Body Dressing: Minimal assistance;Sit to/from stand   Toilet Transfer: Min guard;BSC;RW;Ambulation             General ADL Comments: pt impulsive: started to stand without RW.  He can perform UB adls with set up     Vision     Perception     Praxis      Pertinent Vitals/Pain Pain Assessment: Faces Faces Pain Scale: Hurts a little bit Pain Location: L knee Pain Descriptors / Indicators: Aching Pain Intervention(s): Limited activity within patient's tolerance;Monitored during session;Repositioned;Ice applied;Premedicated before session     Hand Dominance     Extremity/Trunk Assessment Upper Extremity Assessment Upper Extremity Assessment:  Overall WFL for tasks assessed           Communication Communication Communication: HOH   Cognition Arousal/Alertness: Awake/alert Behavior During Therapy: Impulsive Overall Cognitive Status: No family/caregiver present to determine baseline cognitive functioning (HOH, impulsive, easily distracted)                     General Comments       Exercises       Shoulder Instructions      Home Living Family/patient expects to be discharged to:: Private residence Living Arrangements: Spouse/significant other                 Bathroom Shower/Tub: Occupational psychologist: Handicapped height     Home Equipment: Shower seat          Prior Functioning/Environment Level of Independence: Independent                 OT Problem List: Decreased knowledge of use of DME or AE;Decreased strength;Pain;Decreased safety awareness;Decreased cognition   OT Treatment/Interventions: Self-care/ADL training;DME and/or AE instruction;Patient/family education    OT Goals(Current goals can be found in the care plan section) Acute Rehab OT Goals Patient Stated Goal: home OT Goal Formulation: With patient Time For Goal Achievement:  02/27/16 Potential to Achieve Goals: Good ADL Goals Pt Will Transfer to Toilet: with supervision;ambulating;bedside commode Pt Will Perform Toileting - Clothing Manipulation and hygiene: with supervision;sit to/from stand Pt Will Perform Tub/Shower Transfer: Shower transfer;with min guard assist;ambulating;shower seat Additional ADL Goal #1: pt will not need cues for safety during ADLs/bathroom transfers  OT Frequency: Min 2X/week   Barriers to D/C:            Co-evaluation              End of Session    Activity Tolerance: Patient tolerated treatment well Patient left: in chair;with call bell/phone within reach;with chair alarm set   Time: [id number] OT Time Calculation (min): 20 min Charges:  OT General Charges $OT  Visit: 1 Procedure OT Evaluation $OT Eval Low Complexity: 1 Procedure G-Codes:    Rowene Suto 2016-03-15, 10:52 AM  [name], OTR/L [phone] 2016/03/15

## 2016-02-20 NOTE — Progress Notes (Signed)
Discharge planning, spoke with patient and spouse at beside. Chose [facility] for [facility] services, contacted [facility] for referral. Needs RW, contacted [facility] to deliver to room. [phone]

## 2016-02-20 NOTE — Evaluation (Signed)
Physical Therapy Evaluation Patient Details Name: Kurt French [facility] Sr. MRN: [id number] DOB: 1932/09/03 Today's Date: 02/20/2016   History of Present Illness  s/p L TKA  Clinical Impression  Pt s/p L TKR presents with decreased L LE strength/ROM, post op pain, and impulsive nature limiting functional mobility.  Pt should progress to dc home with family assist and HHPT follow up.    Follow Up Recommendations Home health PT    Equipment Recommendations  Rolling walker with 5" wheels    Recommendations for Other Services OT consult     Precautions / Restrictions Precautions Precautions: Knee Restrictions Weight Bearing Restrictions: No LLE Weight Bearing: Weight bearing as tolerated      Mobility  Bed Mobility Overal bed mobility: Needs Assistance Bed Mobility: Supine to Sit     Supine to sit: Min assist     General bed mobility comments: cues for sequence and use of R LE to self assist  Transfers Overall transfer level: Needs assistance Equipment used: Rolling walker (2 wheeled) Transfers: Sit to/from Stand Sit to Stand: Min assist         General transfer comment: cues for LE management and use of UEs to self assist  Ambulation/Gait Ambulation/Gait assistance: Min assist Ambulation Distance (Feet): 58 Feet Assistive device: Rolling walker (2 wheeled) Gait Pattern/deviations: Step-to pattern;Decreased step length - right;Decreased step length - left;Shuffle;Trunk flexed Gait velocity: decr Gait velocity interpretation: Below normal speed for age/gender General Gait Details: cues for sequence, posture, position from RW and safety awareness  Stairs            Wheelchair Mobility    Modified Rankin (Stroke Patients Only)       Balance Overall balance assessment: Needs assistance Sitting-balance support: Feet supported;No upper extremity supported Sitting balance-Leahy Scale: Good     Standing balance support: Bilateral upper extremity  supported Standing balance-Leahy Scale: Poor                               Pertinent Vitals/Pain Pain Assessment: 0-10 Pain Score: 3  Faces Pain Scale: Hurts a little bit Pain Location: L knee Pain Descriptors / Indicators: Aching;Sore Pain Intervention(s): Limited activity within patient's tolerance;Monitored during session;Premedicated before session;Ice applied    Home Living Family/patient expects to be discharged to:: Private residence Living Arrangements: Spouse/significant other Available Help at Discharge: Family Type of Home: House Home Access: Stairs to enter   Technical brewer of Steps: 1 Home Layout: One level Home Equipment: Shower seat      Prior Function Level of Independence: Independent               Hand Dominance        Extremity/Trunk Assessment   Upper Extremity Assessment Upper Extremity Assessment: Overall WFL for tasks assessed    Lower Extremity Assessment Lower Extremity Assessment: LLE deficits/detail LLE Deficits / Details: 3/5 quads with AAROM at knee -8 - 90    Cervical / Trunk Assessment Cervical / Trunk Assessment: Normal  Communication   Communication: HOH  Cognition Arousal/Alertness: Awake/alert Behavior During Therapy: Impulsive Overall Cognitive Status: No family/caregiver present to determine baseline cognitive functioning                      General Comments      Exercises Total Joint Exercises Ankle Circles/Pumps: AROM;Both;15 reps;Supine Quad Sets: AROM;Both;10 reps;Supine Heel Slides: AAROM;Left;15 reps;Supine Straight Leg Raises: AAROM;AROM;Left;15 reps;Supine   Assessment/Plan  PT Assessment Patient needs continued PT services  PT Problem List Decreased strength;Decreased range of motion;Decreased activity tolerance;Decreased balance;Decreased mobility;Decreased knowledge of use of DME;Decreased cognition;Pain;Obesity          PT Treatment Interventions DME  instruction;Gait training;Stair training;Functional mobility training;Therapeutic activities;Therapeutic exercise;Patient/family education    PT Goals (Current goals can be found in the Care Plan section)  Acute Rehab PT Goals Patient Stated Goal: home PT Goal Formulation: With patient Time For Goal Achievement: 02/25/16 Potential to Achieve Goals: Good    Frequency 7X/week   Barriers to discharge        Co-evaluation               End of Session Equipment Utilized During Treatment: Gait belt Activity Tolerance: Patient tolerated treatment well Patient left: in chair;with call bell/phone within reach Nurse Communication: Mobility status         Time: 0800-0840 PT Time Calculation (min) (ACUTE ONLY): 40 min   Charges:   PT Evaluation $PT Eval Low Complexity: 1 Procedure PT Treatments $Gait Training: 8-22 mins $Therapeutic Exercise: 8-22 mins   PT G Codes:        Jean Alejos March 15, 2016, 1:03 PM

## 2016-02-20 NOTE — Progress Notes (Signed)
   Subjective:  Patient reports pain as mild to moderate.  Denies N/V/CP/SOB.  Objective:   VITALS:   Vitals:   02/19/16 2145 02/19/16 2221 02/19/16 2320 02/20/16 0555  BP: (!) 153/81 (!) 153/84 (!) 158/84 (!) 164/79  Pulse: (!) 52 (!) 51 73 80  Resp: 16 20 20  (!) 24  Temp: 97.6 F (36.4 C) 97.5 F (36.4 C) 97.7 F (36.5 C) 97.6 F (36.4 C)  TempSrc: Oral Oral Oral Oral  SpO2: 100% 100% 100% 98%  Weight:      Height:        NAD ABD soft Sensation intact distally Intact pulses distally Dorsiflexion/Plantar flexion intact Incision: dressing C/D/I Compartment soft   Lab Results  Component Value Date   WBC 21.4 (H) 02/20/2016   HGB 12.4 (L) 02/20/2016   HCT 35.0 (L) 02/20/2016   MCV 88.4 02/20/2016   PLT 332 02/20/2016   BMET    Component Value Date/Time   NA 135 02/20/2016 0406   K 3.3 (L) 02/20/2016 0406   CL 103 02/20/2016 0406   CO2 24 02/20/2016 0406   GLUCOSE 226 (H) 02/20/2016 0406   BUN 22 (H) 02/20/2016 0406   CREATININE 1.30 (H) 02/20/2016 0406   CALCIUM 8.3 (L) 02/20/2016 0406   GFRNONAA 50 (L) 02/20/2016 0406   GFRAA 58 (L) 02/20/2016 0406     Assessment/Plan: 1 Day Post-Op   Active Problems:   Degenerative arthritis of left knee   WBAT with walker DVT ppx: ASA, SCDs, TEDs PO pain control PT/OT Dispo: D/C home after clears therapy    Kurt French, Horald Pollen 02/20/2016, 7:51 AM   Rod Can, MD [phone]

## 2016-02-20 NOTE — Progress Notes (Signed)
Physical Therapy Treatment Patient Details Name: Kurt French [facility] MRN: [id number] DOB: 22-May-1932 Today's Date: 02/20/2016    History of Present Illness s/p L TKA    PT Comments    Pt motivated and cooperative but continues impulsive, unsteady and with delayed processing.  Follow Up Recommendations  Home health PT     Equipment Recommendations  Rolling walker with 5" wheels    Recommendations for Other Services OT consult     Precautions / Restrictions Precautions Precautions: Knee Restrictions Weight Bearing Restrictions: No LLE Weight Bearing: Weight bearing as tolerated    Mobility  Bed Mobility Overal bed mobility: Needs Assistance Bed Mobility: Sit to Supine     Supine to sit: Min assist Sit to supine: Min guard   General bed mobility comments: cues for sequence and use of R LE to self assist  Transfers Overall transfer level: Needs assistance Equipment used: Rolling walker (2 wheeled) Transfers: Sit to/from Stand Sit to Stand: Min assist         General transfer comment: cues for LE management and use of UEs to self assist  Ambulation/Gait Ambulation/Gait assistance: Min assist Ambulation Distance (Feet): 75 Feet (twice) Assistive device: Rolling walker (2 wheeled) Gait Pattern/deviations: Decreased step length - right;Decreased step length - left;Shuffle;Trunk flexed;Step-to pattern;Step-through pattern Gait velocity: decr Gait velocity interpretation: Below normal speed for age/gender General Gait Details: cues for sequence, posture, position from RW and safety awareness   Stairs            Wheelchair Mobility    Modified Rankin (Stroke Patients Only)       Balance Overall balance assessment: Needs assistance Sitting-balance support: Feet supported;No upper extremity supported Sitting balance-Leahy Scale: Good     Standing balance support: Bilateral upper extremity supported Standing balance-Leahy Scale: Poor                      Cognition Arousal/Alertness: Awake/alert Behavior During Therapy: Impulsive Overall Cognitive Status: No family/caregiver present to determine baseline cognitive functioning                 General Comments: Pt continues impulsive and with delayed processing this afternoon creating safety issues for dc home    Exercises Total Joint Exercises Ankle Circles/Pumps: AROM;Both;15 reps;Supine Quad Sets: AROM;Both;10 reps;Supine Heel Slides: AAROM;Left;15 reps;Supine Straight Leg Raises: AAROM;AROM;Left;15 reps;Supine    General Comments        Pertinent Vitals/Pain Pain Assessment: 0-10 Pain Score: 3  Pain Location: L knee Pain Descriptors / Indicators: Aching;Sore Pain Intervention(s): Limited activity within patient's tolerance;Monitored during session;Premedicated before session;Ice applied    Home Living Family/patient expects to be discharged to:: Private residence Living Arrangements: Spouse/significant other Available Help at Discharge: Family Type of Home: House Home Access: Stairs to enter   Home Layout: One level Home Equipment: Shower seat      Prior Function Level of Independence: Independent          PT Goals (current goals can now be found in the care plan section) Acute Rehab PT Goals Patient Stated Goal: home PT Goal Formulation: With patient Time For Goal Achievement: 02/25/16 Potential to Achieve Goals: Good Progress towards PT goals: Progressing toward goals    Frequency    7X/week      PT Plan Current plan remains appropriate    Co-evaluation             End of Session Equipment Utilized During Treatment: Gait belt Activity Tolerance: Patient tolerated treatment well  Patient left: in bed;with call bell/phone within reach;with bed alarm set     Time: 1323-1346 PT Time Calculation (min) (ACUTE ONLY): 23 min  Charges:  $Gait Training: 23-37 mins $Therapeutic Exercise: 8-22 mins                    G  Codes:      Kurt French Feb 23, 2016, 2:29 PM

## 2016-02-21 LAB — CBC
HCT: 28.4 % — ABNORMAL LOW (ref 39.0–52.0)
Hemoglobin: 10.2 g/dL — ABNORMAL LOW (ref 13.0–17.0)
MCH: 31.7 pg (ref 26.0–34.0)
MCHC: 35.9 g/dL (ref 30.0–36.0)
MCV: 88.2 fL (ref 78.0–100.0)
Platelets: 289 10*3/uL (ref 150–400)
RBC: 3.22 MIL/uL — ABNORMAL LOW (ref 4.22–5.81)
RDW: 13.3 % (ref 11.5–15.5)
WBC: 27.2 10*3/uL — ABNORMAL HIGH (ref 4.0–10.5)

## 2016-02-21 LAB — BASIC METABOLIC PANEL
Anion gap: 8 (ref 5–15)
BUN: 23 mg/dL — ABNORMAL HIGH (ref 6–20)
CALCIUM: 8.1 mg/dL — AB (ref 8.9–10.3)
CO2: 26 mmol/L (ref 22–32)
CREATININE: 1.24 mg/dL (ref 0.61–1.24)
Chloride: 101 mmol/L (ref 101–111)
GFR calc non Af Amer: 53 mL/min — ABNORMAL LOW (ref >60)
Glucose, Bld: 127 mg/dL — ABNORMAL HIGH (ref 65–99)
Potassium: 3.5 mmol/L (ref 3.5–5.1)
SODIUM: 135 mmol/L (ref 135–145)

## 2016-02-21 NOTE — Progress Notes (Signed)
Pt to d/c home with [facility]. RW delivered to room prior to d/c. AVS reviewed and "My Chart" discussed with pt. Pt capable of verbalizing medications, signs and symptoms of infection, and follow-up appointments. Remains hemodynamically stable. No signs and symptoms of distress. Educated pt to return to ER in the case of SOB, dizziness, or chest pain.

## 2016-02-21 NOTE — Progress Notes (Signed)
Physical Therapy Treatment Patient Details Name: Kurt French [facility] Sr. MRN: [id number] DOB: [birth year] Today's Date: 02/21/2016    History of Present Illness s/p L TKA    PT Comments    Pt progressing well with mobility and eager for dc home.  Reviewed home therex and stairs with written instructions provided.  Spouse present for part of session  Follow Up Recommendations  Home health PT     Equipment Recommendations  Rolling walker with 5" wheels    Recommendations for Other Services OT consult     Precautions / Restrictions Precautions Precautions: Knee Restrictions Weight Bearing Restrictions: No LLE Weight Bearing: Weight bearing as tolerated    Mobility  Bed Mobility               General bed mobility comments: Pt OOB and requests back to chair  Transfers Overall transfer level: Needs assistance Equipment used: Rolling walker (2 wheeled) Transfers: Sit to/from Stand Sit to Stand: Supervision         General transfer comment: cues for safety, UE/LE placement  Ambulation/Gait Ambulation/Gait assistance: Min guard;Supervision Ambulation Distance (Feet): 190 Feet Assistive device: Rolling walker (2 wheeled) Gait Pattern/deviations: Decreased step length - right;Decreased step length - left;Shuffle;Trunk flexed;Step-to pattern;Step-through pattern Gait velocity: decr Gait velocity interpretation: Below normal speed for age/gender General Gait Details: cues for sequence, posture, position from RW and safety on turns   Stairs Stairs: Yes   Stair Management: No rails;Step to pattern;Forwards;With walker Number of Stairs: 2 General stair comments: single step twice fwd with RW and cues for sequence and foot/RW placement.  Spouse present adn weritten instruction provided  Wheelchair Mobility    Modified Rankin (Stroke Patients Only)       Balance   Sitting-balance support: Feet supported;No upper extremity supported Sitting balance-Leahy Scale:  Good     Standing balance support: No upper extremity supported Standing balance-Leahy Scale: Fair                      Cognition Arousal/Alertness: Awake/alert Behavior During Therapy: Impulsive Overall Cognitive Status: No family/caregiver present to determine baseline cognitive functioning                 General Comments: decreased safety; impulsive but improved from yesterday.  Spouse present and reports this is his norm    Exercises Total Joint Exercises Ankle Circles/Pumps: AROM;Both;15 reps;Supine Quad Sets: AROM;Both;Supine;15 reps Heel Slides: AAROM;Left;Supine;20 reps Straight Leg Raises: AAROM;AROM;Left;Supine;20 reps Long Arc Quad: AROM;Left;10 reps;Supine    General Comments        Pertinent Vitals/Pain Pain Assessment: 0-10 Pain Score: 4  Pain Location: L knee Pain Descriptors / Indicators: Sore Pain Intervention(s): Limited activity within patient's tolerance;Monitored during session;Ice applied    Home Living                      Prior Function            PT Goals (current goals can now be found in the care plan section) Acute Rehab PT Goals Patient Stated Goal: home PT Goal Formulation: With patient Time For Goal Achievement: 02/25/16 Potential to Achieve Goals: Good Progress towards PT goals: Progressing toward goals    Frequency    7X/week      PT Plan Current plan remains appropriate    Co-evaluation             End of Session Equipment Utilized During Treatment: Gait belt Activity Tolerance: Patient tolerated treatment well  Patient left: in chair;with call bell/phone within reach     Time: 0837-0922 PT Time Calculation (min) (ACUTE ONLY): 45 min  Charges:  $Gait Training: 8-22 mins $Therapeutic Exercise: 8-22 mins $Therapeutic Activity: 8-22 mins                    G Codes:      Kurt French March 12, 2016, 11:21 AM

## 2016-02-21 NOTE — Progress Notes (Signed)
Subjective: 2 Days Post-Op Procedure(s) (LRB): LEFT TOTAL KNEE ARTHROPLASTY WITH COMPUTER NAVIGATION (Left) Patient reports pain as 3 on 0-10 scale.    Objective: Vital signs in last 24 hours: Temp:  [97.6 F (36.4 C)-98 F (36.7 C)] 97.6 F (36.4 C) (12/23 0500) Pulse Rate:  [56-82] 56 (12/23 0500) Resp:  [19-22] 20 (12/23 0500) BP: (135-161)/(67-81) 154/67 (12/23 0500) SpO2:  [94 %-97 %] 96 % (12/23 0500)  Intake/Output from previous day: 12/22 0701 - 12/23 0700 In: 480 [P.O.:240; I.V.:240] Out: 300 [Urine:300] Intake/Output this shift: No intake/output data recorded.   Recent Labs  02/20/16 0406 02/21/16 0503  HGB 12.4* 10.2*    Recent Labs  02/20/16 0406 02/21/16 0503  WBC 21.4* 27.2*  RBC 3.96* 3.22*  HCT 35.0* 28.4*  PLT 332 289    Recent Labs  02/20/16 0406  NA 135  K 3.3*  CL 103  CO2 24  BUN 22*  CREATININE 1.30*  GLUCOSE 226*  CALCIUM 8.3*   No results for input(s): LABPT, INR in the last 72 hours.  Neurologically intact Neurovascular intact Sensation intact distally Dorsiflexion/Plantar flexion intact Incision: dressing C/D/I and no drainage No cellulitis present Compartment soft  Assessment/Plan: 2 Days Post-Op Procedure(s) (LRB): LEFT TOTAL KNEE ARTHROPLASTY WITH COMPUTER NAVIGATION (Left) Advance diet Up with therapy  Cr 1.3 yesterday. Good UO. WBC up no sign of infection. No burning with urination. No cough. Wound good. Repeat WBC tomorrow. Also BMET today  Kurt French C 02/21/2016, 8:10 AM

## 2016-02-21 NOTE — Progress Notes (Signed)
Occupational Therapy Treatment Patient Details Name: Kurt French [facility] Sr. MRN: [id number] DOB: December 31, 1932 Today's Date: 02/21/2016    History of present illness s/p L TKA   OT comments  Pt needs reinforcement with safety  Follow Up Recommendations  Supervision/Assistance - 24 hour    Equipment Recommendations  None recommended by OT    Recommendations for Other Services      Precautions / Restrictions Precautions Precautions: Knee Restrictions LLE Weight Bearing: Weight bearing as tolerated       Mobility Bed Mobility               General bed mobility comments: pt on EOB  Transfers   Equipment used: Rolling walker (2 wheeled)   Sit to Stand: Min guard         General transfer comment: cues for safety, UE/LE placement    Balance                                   ADL                           Toilet Transfer: Min guard;Comfort height toilet;RW   Toileting- Water quality scientist and Hygiene: Min guard;Sit to/from stand   Tub/ Shower Transfer: Min guard;3 in 1;Ambulation;Walk-in shower     General ADL Comments: pt was getting up on his own when I arrived; alarm sounding.  Pt attempted to put RW behind toilet to use a frame to sit on. Cued to keep in front of him and use commode and RW to push up from.  Practiced shower transfer.  Wife was not present:  will review safety with her      Vision                     Perception     Praxis      Cognition   Behavior During Therapy: Impulsive Overall Cognitive Status: No family/caregiver present to determine baseline cognitive functioning                  General Comments: decreased safety; impulsive    Extremity/Trunk Assessment               Exercises     Shoulder Instructions       General Comments      Pertinent Vitals/ Pain       Pain Score: 2  Pain Location: L knee Pain Descriptors / Indicators: Sore Pain Intervention(s): Limited  activity within patient's tolerance;Monitored during session;Premedicated before session;Repositioned  Home Living                                          Prior Functioning/Environment              Frequency  Min 2X/week        Progress Toward Goals  OT Goals(current goals can now be found in the care plan section)  Progress towards OT goals: Progressing toward goals     Plan      Co-evaluation                 End of Session     Activity Tolerance Patient tolerated treatment well   Patient Left in chair;with call bell/phone within reach;with chair alarm set  Nurse Communication          Time: [phone] OT Time Calculation (min): 16 min  Charges: OT General Charges $OT Visit: 1 Procedure OT Treatments $Self Care/Home Management : 8-22 mins  Kurt French 02/21/2016, 8:41 AM  Kurt French, OTR/L [phone] 02/21/2016

## 2016-02-25 DIAGNOSIS — E782 Mixed hyperlipidemia: Secondary | ICD-10-CM | POA: Diagnosis not present

## 2016-02-25 DIAGNOSIS — Z96652 Presence of left artificial knee joint: Secondary | ICD-10-CM | POA: Diagnosis not present

## 2016-02-25 DIAGNOSIS — N400 Enlarged prostate without lower urinary tract symptoms: Secondary | ICD-10-CM | POA: Diagnosis not present

## 2016-02-25 DIAGNOSIS — I10 Essential (primary) hypertension: Secondary | ICD-10-CM | POA: Diagnosis not present

## 2016-02-25 DIAGNOSIS — Z7982 Long term (current) use of aspirin: Secondary | ICD-10-CM | POA: Diagnosis not present

## 2016-02-25 DIAGNOSIS — Z471 Aftercare following joint replacement surgery: Secondary | ICD-10-CM | POA: Diagnosis not present

## 2016-02-27 DIAGNOSIS — N400 Enlarged prostate without lower urinary tract symptoms: Secondary | ICD-10-CM | POA: Diagnosis not present

## 2016-02-27 DIAGNOSIS — I10 Essential (primary) hypertension: Secondary | ICD-10-CM | POA: Diagnosis not present

## 2016-02-27 DIAGNOSIS — Z7982 Long term (current) use of aspirin: Secondary | ICD-10-CM | POA: Diagnosis not present

## 2016-02-27 DIAGNOSIS — Z96652 Presence of left artificial knee joint: Secondary | ICD-10-CM | POA: Diagnosis not present

## 2016-02-27 DIAGNOSIS — E782 Mixed hyperlipidemia: Secondary | ICD-10-CM | POA: Diagnosis not present

## 2016-02-27 DIAGNOSIS — Z471 Aftercare following joint replacement surgery: Secondary | ICD-10-CM | POA: Diagnosis not present

## 2016-03-02 DIAGNOSIS — Z96652 Presence of left artificial knee joint: Secondary | ICD-10-CM | POA: Diagnosis not present

## 2016-03-02 DIAGNOSIS — M25562 Pain in left knee: Secondary | ICD-10-CM | POA: Diagnosis not present

## 2016-03-02 DIAGNOSIS — Z471 Aftercare following joint replacement surgery: Secondary | ICD-10-CM | POA: Diagnosis not present

## 2016-03-09 DIAGNOSIS — Z96652 Presence of left artificial knee joint: Secondary | ICD-10-CM | POA: Diagnosis not present

## 2016-03-12 DIAGNOSIS — Z96652 Presence of left artificial knee joint: Secondary | ICD-10-CM | POA: Diagnosis not present

## 2016-03-12 DIAGNOSIS — J019 Acute sinusitis, unspecified: Secondary | ICD-10-CM | POA: Diagnosis not present

## 2016-03-12 DIAGNOSIS — M1712 Unilateral primary osteoarthritis, left knee: Secondary | ICD-10-CM | POA: Diagnosis not present

## 2016-03-12 DIAGNOSIS — G4709 Other insomnia: Secondary | ICD-10-CM | POA: Diagnosis not present

## 2016-03-12 DIAGNOSIS — Z6833 Body mass index (BMI) 33.0-33.9, adult: Secondary | ICD-10-CM | POA: Diagnosis not present

## 2016-04-23 DIAGNOSIS — M545 Low back pain: Secondary | ICD-10-CM | POA: Diagnosis not present

## 2016-04-23 DIAGNOSIS — R918 Other nonspecific abnormal finding of lung field: Secondary | ICD-10-CM | POA: Diagnosis not present

## 2016-04-23 DIAGNOSIS — N400 Enlarged prostate without lower urinary tract symptoms: Secondary | ICD-10-CM | POA: Diagnosis not present

## 2016-04-23 DIAGNOSIS — E039 Hypothyroidism, unspecified: Secondary | ICD-10-CM | POA: Diagnosis not present

## 2016-04-28 DIAGNOSIS — Z1389 Encounter for screening for other disorder: Secondary | ICD-10-CM | POA: Diagnosis not present

## 2016-04-28 DIAGNOSIS — R972 Elevated prostate specific antigen [PSA]: Secondary | ICD-10-CM | POA: Diagnosis not present

## 2016-04-28 DIAGNOSIS — N182 Chronic kidney disease, stage 2 (mild): Secondary | ICD-10-CM | POA: Diagnosis not present

## 2016-04-28 DIAGNOSIS — R0600 Dyspnea, unspecified: Secondary | ICD-10-CM | POA: Diagnosis not present

## 2016-04-28 DIAGNOSIS — I10 Essential (primary) hypertension: Secondary | ICD-10-CM | POA: Diagnosis not present

## 2016-04-28 DIAGNOSIS — N400 Enlarged prostate without lower urinary tract symptoms: Secondary | ICD-10-CM | POA: Diagnosis not present

## 2016-04-28 DIAGNOSIS — N323 Diverticulum of bladder: Secondary | ICD-10-CM | POA: Diagnosis not present

## 2016-04-28 DIAGNOSIS — E039 Hypothyroidism, unspecified: Secondary | ICD-10-CM | POA: Diagnosis not present

## 2016-04-29 DIAGNOSIS — H90A21 Sensorineural hearing loss, unilateral, right ear, with restricted hearing on the contralateral side: Secondary | ICD-10-CM | POA: Diagnosis not present

## 2016-04-29 DIAGNOSIS — H9042 Sensorineural hearing loss, unilateral, left ear, with unrestricted hearing on the contralateral side: Secondary | ICD-10-CM | POA: Diagnosis not present

## 2016-04-29 DIAGNOSIS — H903 Sensorineural hearing loss, bilateral: Secondary | ICD-10-CM | POA: Diagnosis not present

## 2016-04-29 DIAGNOSIS — H93293 Other abnormal auditory perceptions, bilateral: Secondary | ICD-10-CM | POA: Diagnosis not present

## 2016-05-17 DIAGNOSIS — R972 Elevated prostate specific antigen [PSA]: Secondary | ICD-10-CM | POA: Diagnosis not present

## 2016-05-17 DIAGNOSIS — R3914 Feeling of incomplete bladder emptying: Secondary | ICD-10-CM | POA: Diagnosis not present

## 2016-05-17 DIAGNOSIS — R350 Frequency of micturition: Secondary | ICD-10-CM | POA: Diagnosis not present

## 2016-05-17 DIAGNOSIS — N323 Diverticulum of bladder: Secondary | ICD-10-CM | POA: Diagnosis not present

## 2016-05-17 DIAGNOSIS — N401 Enlarged prostate with lower urinary tract symptoms: Secondary | ICD-10-CM | POA: Diagnosis not present

## 2016-05-18 DIAGNOSIS — H353114 Nonexudative age-related macular degeneration, right eye, advanced atrophic with subfoveal involvement: Secondary | ICD-10-CM | POA: Diagnosis not present

## 2016-05-18 DIAGNOSIS — Z961 Presence of intraocular lens: Secondary | ICD-10-CM | POA: Diagnosis not present

## 2016-05-18 DIAGNOSIS — H401133 Primary open-angle glaucoma, bilateral, severe stage: Secondary | ICD-10-CM | POA: Diagnosis not present

## 2016-05-18 DIAGNOSIS — H353222 Exudative age-related macular degeneration, left eye, with inactive choroidal neovascularization: Secondary | ICD-10-CM | POA: Diagnosis not present

## 2016-05-25 DIAGNOSIS — K5730 Diverticulosis of large intestine without perforation or abscess without bleeding: Secondary | ICD-10-CM | POA: Diagnosis not present

## 2016-05-25 DIAGNOSIS — N323 Diverticulum of bladder: Secondary | ICD-10-CM | POA: Diagnosis not present

## 2016-06-15 DIAGNOSIS — R3914 Feeling of incomplete bladder emptying: Secondary | ICD-10-CM | POA: Diagnosis not present

## 2016-06-15 DIAGNOSIS — N323 Diverticulum of bladder: Secondary | ICD-10-CM | POA: Diagnosis not present

## 2016-06-15 DIAGNOSIS — R972 Elevated prostate specific antigen [PSA]: Secondary | ICD-10-CM | POA: Diagnosis not present

## 2016-06-15 DIAGNOSIS — N401 Enlarged prostate with lower urinary tract symptoms: Secondary | ICD-10-CM | POA: Diagnosis not present

## 2016-06-15 DIAGNOSIS — R350 Frequency of micturition: Secondary | ICD-10-CM | POA: Diagnosis not present

## 2016-06-24 DIAGNOSIS — S76311A Strain of muscle, fascia and tendon of the posterior muscle group at thigh level, right thigh, initial encounter: Secondary | ICD-10-CM | POA: Diagnosis not present

## 2016-06-24 DIAGNOSIS — Z6833 Body mass index (BMI) 33.0-33.9, adult: Secondary | ICD-10-CM | POA: Diagnosis not present

## 2016-07-22 DIAGNOSIS — N401 Enlarged prostate with lower urinary tract symptoms: Secondary | ICD-10-CM | POA: Diagnosis not present

## 2016-12-01 DIAGNOSIS — R69 Illness, unspecified: Secondary | ICD-10-CM | POA: Diagnosis not present

## 2016-12-01 DIAGNOSIS — H04123 Dry eye syndrome of bilateral lacrimal glands: Secondary | ICD-10-CM | POA: Diagnosis not present

## 2016-12-01 DIAGNOSIS — H401133 Primary open-angle glaucoma, bilateral, severe stage: Secondary | ICD-10-CM | POA: Diagnosis not present

## 2016-12-02 DIAGNOSIS — G4709 Other insomnia: Secondary | ICD-10-CM | POA: Diagnosis not present

## 2016-12-02 DIAGNOSIS — M4306 Spondylolysis, lumbar region: Secondary | ICD-10-CM | POA: Diagnosis not present

## 2016-12-02 DIAGNOSIS — E039 Hypothyroidism, unspecified: Secondary | ICD-10-CM | POA: Diagnosis not present

## 2016-12-02 DIAGNOSIS — I10 Essential (primary) hypertension: Secondary | ICD-10-CM | POA: Diagnosis not present

## 2016-12-02 DIAGNOSIS — N182 Chronic kidney disease, stage 2 (mild): Secondary | ICD-10-CM | POA: Diagnosis not present

## 2016-12-02 DIAGNOSIS — E559 Vitamin D deficiency, unspecified: Secondary | ICD-10-CM | POA: Diagnosis not present

## 2016-12-07 DIAGNOSIS — Z0001 Encounter for general adult medical examination with abnormal findings: Secondary | ICD-10-CM | POA: Diagnosis not present

## 2016-12-07 DIAGNOSIS — N323 Diverticulum of bladder: Secondary | ICD-10-CM | POA: Diagnosis not present

## 2016-12-07 DIAGNOSIS — E559 Vitamin D deficiency, unspecified: Secondary | ICD-10-CM | POA: Diagnosis not present

## 2016-12-07 DIAGNOSIS — E039 Hypothyroidism, unspecified: Secondary | ICD-10-CM | POA: Diagnosis not present

## 2016-12-07 DIAGNOSIS — N401 Enlarged prostate with lower urinary tract symptoms: Secondary | ICD-10-CM | POA: Diagnosis not present

## 2016-12-07 DIAGNOSIS — I10 Essential (primary) hypertension: Secondary | ICD-10-CM | POA: Diagnosis not present

## 2016-12-07 DIAGNOSIS — N182 Chronic kidney disease, stage 2 (mild): Secondary | ICD-10-CM | POA: Diagnosis not present

## 2016-12-07 DIAGNOSIS — R972 Elevated prostate specific antigen [PSA]: Secondary | ICD-10-CM | POA: Diagnosis not present

## 2016-12-07 DIAGNOSIS — G4709 Other insomnia: Secondary | ICD-10-CM | POA: Diagnosis not present

## 2016-12-07 DIAGNOSIS — M1712 Unilateral primary osteoarthritis, left knee: Secondary | ICD-10-CM | POA: Diagnosis not present

## 2017-01-12 ENCOUNTER — Ambulatory Visit: Payer: Medicare HMO | Admitting: Urology

## 2017-01-12 DIAGNOSIS — N323 Diverticulum of bladder: Secondary | ICD-10-CM

## 2017-01-12 DIAGNOSIS — N401 Enlarged prostate with lower urinary tract symptoms: Secondary | ICD-10-CM

## 2017-01-13 ENCOUNTER — Other Ambulatory Visit: Payer: Self-pay | Admitting: Urology

## 2017-01-14 DIAGNOSIS — H401133 Primary open-angle glaucoma, bilateral, severe stage: Secondary | ICD-10-CM | POA: Diagnosis not present

## 2017-01-14 DIAGNOSIS — H40052 Ocular hypertension, left eye: Secondary | ICD-10-CM | POA: Diagnosis not present

## 2017-01-26 ENCOUNTER — Encounter (HOSPITAL_BASED_OUTPATIENT_CLINIC_OR_DEPARTMENT_OTHER): Payer: Self-pay | Admitting: *Deleted

## 2017-01-26 ENCOUNTER — Other Ambulatory Visit: Payer: Self-pay

## 2017-01-26 DIAGNOSIS — H6123 Impacted cerumen, bilateral: Secondary | ICD-10-CM | POA: Diagnosis not present

## 2017-01-26 DIAGNOSIS — M1990 Unspecified osteoarthritis, unspecified site: Secondary | ICD-10-CM | POA: Diagnosis not present

## 2017-01-26 NOTE — Progress Notes (Signed)
SPOKE W/ PT WIFE, Kurt French, VIA PHONE FOR PRE-OP INTERVIEW.  PT HAS DEMENTIA, WILL NEED WIFE IN PRE-OP.  NPO AFTER MN W/ EXCEPTION CLEAR LIQUIDS UNTIL 0845 (NO CREAM/ MILK PRODUCTS).  ARRIVE AT 2549.  NEEDS ISTAT AND EKG.  WILL TAKE PROSCAR AND FLOMAX AM DOS W/ SIPS OF WATER.

## 2017-01-31 ENCOUNTER — Ambulatory Visit (HOSPITAL_BASED_OUTPATIENT_CLINIC_OR_DEPARTMENT_OTHER): Payer: Medicare HMO | Admitting: Anesthesiology

## 2017-01-31 ENCOUNTER — Encounter (HOSPITAL_BASED_OUTPATIENT_CLINIC_OR_DEPARTMENT_OTHER)
Admission: RE | Disposition: A | Discharge status: Home / Self Care | Payer: Self-pay | Source: Ambulatory Visit | Attending: Urology

## 2017-01-31 ENCOUNTER — Encounter (HOSPITAL_BASED_OUTPATIENT_CLINIC_OR_DEPARTMENT_OTHER): Payer: Self-pay

## 2017-01-31 ENCOUNTER — Ambulatory Visit (HOSPITAL_BASED_OUTPATIENT_CLINIC_OR_DEPARTMENT_OTHER)
Admission: RE | Admit: 2017-01-31 | Discharge: 2017-01-31 | Disposition: A | Discharge status: Home / Self Care | Payer: Medicare HMO | Source: Ambulatory Visit | Attending: Urology | Admitting: Urology

## 2017-01-31 DIAGNOSIS — N401 Enlarged prostate with lower urinary tract symptoms: Secondary | ICD-10-CM | POA: Insufficient documentation

## 2017-01-31 DIAGNOSIS — Z87891 Personal history of nicotine dependence: Secondary | ICD-10-CM | POA: Insufficient documentation

## 2017-01-31 DIAGNOSIS — I252 Old myocardial infarction: Secondary | ICD-10-CM | POA: Diagnosis not present

## 2017-01-31 DIAGNOSIS — H409 Unspecified glaucoma: Secondary | ICD-10-CM | POA: Insufficient documentation

## 2017-01-31 DIAGNOSIS — I10 Essential (primary) hypertension: Secondary | ICD-10-CM | POA: Insufficient documentation

## 2017-01-31 DIAGNOSIS — N400 Enlarged prostate without lower urinary tract symptoms: Secondary | ICD-10-CM | POA: Diagnosis not present

## 2017-01-31 DIAGNOSIS — E039 Hypothyroidism, unspecified: Secondary | ICD-10-CM | POA: Diagnosis not present

## 2017-01-31 DIAGNOSIS — Z96652 Presence of left artificial knee joint: Secondary | ICD-10-CM | POA: Insufficient documentation

## 2017-01-31 DIAGNOSIS — I493 Ventricular premature depolarization: Secondary | ICD-10-CM | POA: Diagnosis not present

## 2017-01-31 DIAGNOSIS — Z9841 Cataract extraction status, right eye: Secondary | ICD-10-CM | POA: Diagnosis not present

## 2017-01-31 DIAGNOSIS — I4510 Unspecified right bundle-branch block: Secondary | ICD-10-CM | POA: Insufficient documentation

## 2017-01-31 DIAGNOSIS — Z9842 Cataract extraction status, left eye: Secondary | ICD-10-CM | POA: Insufficient documentation

## 2017-01-31 DIAGNOSIS — E782 Mixed hyperlipidemia: Secondary | ICD-10-CM | POA: Diagnosis not present

## 2017-01-31 DIAGNOSIS — R3914 Feeling of incomplete bladder emptying: Secondary | ICD-10-CM | POA: Diagnosis not present

## 2017-01-31 DIAGNOSIS — Z800 Family history of malignant neoplasm of digestive organs: Secondary | ICD-10-CM | POA: Diagnosis not present

## 2017-01-31 DIAGNOSIS — Z79899 Other long term (current) drug therapy: Secondary | ICD-10-CM | POA: Diagnosis not present

## 2017-01-31 DIAGNOSIS — M1990 Unspecified osteoarthritis, unspecified site: Secondary | ICD-10-CM | POA: Insufficient documentation

## 2017-01-31 DIAGNOSIS — I2510 Atherosclerotic heart disease of native coronary artery without angina pectoris: Secondary | ICD-10-CM | POA: Insufficient documentation

## 2017-01-31 DIAGNOSIS — M1712 Unilateral primary osteoarthritis, left knee: Secondary | ICD-10-CM | POA: Diagnosis not present

## 2017-01-31 HISTORY — DX: Old myocardial infarction: I25.2

## 2017-01-31 HISTORY — DX: Unspecified glaucoma: H40.9

## 2017-01-31 HISTORY — PX: CYSTOSCOPY WITH INSERTION OF UROLIFT: SHX6678

## 2017-01-31 HISTORY — DX: Presence of spectacles and contact lenses: Z97.3

## 2017-01-31 HISTORY — DX: Unspecified symptoms and signs involving the genitourinary system: R39.9

## 2017-01-31 HISTORY — DX: Ventricular premature depolarization: I49.3

## 2017-01-31 HISTORY — DX: Other fecal abnormalities: R19.5

## 2017-01-31 HISTORY — DX: Atherosclerotic heart disease of native coronary artery without angina pectoris: I25.10

## 2017-01-31 HISTORY — DX: Unspecified dementia, unspecified severity, without behavioral disturbance, psychotic disturbance, mood disturbance, and anxiety: F03.90

## 2017-01-31 HISTORY — DX: Unspecified right bundle-branch block: I45.10

## 2017-01-31 HISTORY — DX: Unspecified osteoarthritis, unspecified site: M19.90

## 2017-01-31 SURGERY — CYSTOSCOPY WITH INSERTION OF UROLIFT
Anesthesia: Monitor Anesthesia Care

## 2017-01-31 MED ORDER — TRAMADOL HCL 50 MG PO TABS: 50.0000 mg | ORAL_TABLET | Freq: Four times a day (QID) | ORAL | 0 refills | Status: DC | PRN

## 2017-01-31 MED ORDER — LIDOCAINE 2% (20 MG/ML) 5 ML SYRINGE
INTRAMUSCULAR | Status: AC
  Filled 2017-01-31: qty 5

## 2017-01-31 MED ORDER — FENTANYL CITRATE (PF) 100 MCG/2ML IJ SOLN
INTRAMUSCULAR | Status: DC | PRN
  Administered 2017-01-31 (×8): 12.5 ug via INTRAVENOUS

## 2017-01-31 MED ORDER — LIDOCAINE HCL (CARDIAC) 20 MG/ML IV SOLN
INTRAVENOUS | Status: DC | PRN
  Administered 2017-01-31: 40 mg via INTRAVENOUS

## 2017-01-31 MED ORDER — FENTANYL CITRATE (PF) 100 MCG/2ML IJ SOLN
INTRAMUSCULAR | Status: AC
  Filled 2017-01-31: qty 2

## 2017-01-31 MED ORDER — ONDANSETRON HCL 4 MG/2ML IJ SOLN
INTRAMUSCULAR | Status: DC | PRN
Start: 2017-01-31 — End: 2017-01-31
  Administered 2017-01-31: 4 mg via INTRAVENOUS

## 2017-01-31 MED ORDER — CEFAZOLIN SODIUM-DEXTROSE 2-4 GM/100ML-% IV SOLN
2.0000 g | INTRAVENOUS | Status: AC
  Administered 2017-01-31: 2 g via INTRAVENOUS
  Filled 2017-01-31: qty 100

## 2017-01-31 MED ORDER — PROPOFOL 10 MG/ML IV BOLUS
INTRAVENOUS | Status: AC
  Filled 2017-01-31: qty 20

## 2017-01-31 MED ORDER — ONDANSETRON HCL 4 MG/2ML IJ SOLN
INTRAMUSCULAR | Status: AC
  Filled 2017-01-31: qty 2

## 2017-01-31 MED ORDER — PROPOFOL 10 MG/ML IV BOLUS
INTRAVENOUS | Status: DC | PRN
  Administered 2017-01-31 (×2): 10 mg via INTRAVENOUS

## 2017-01-31 MED ORDER — LACTATED RINGERS IV SOLN
INTRAVENOUS | Status: DC
  Administered 2017-01-31: 1000 mL via INTRAVENOUS
  Filled 2017-01-31: qty 1000

## 2017-01-31 MED ORDER — PROPOFOL 500 MG/50ML IV EMUL
INTRAVENOUS | Status: DC | PRN
  Administered 2017-01-31: 25 ug/kg/min via INTRAVENOUS

## 2017-01-31 MED ORDER — DEXAMETHASONE SODIUM PHOSPHATE 4 MG/ML IJ SOLN
INTRAMUSCULAR | Status: DC | PRN
  Administered 2017-01-31: 5 mg via INTRAVENOUS

## 2017-01-31 MED ORDER — PROMETHAZINE HCL 25 MG/ML IJ SOLN
6.2500 mg | INTRAMUSCULAR | Status: DC | PRN
  Filled 2017-01-31: qty 1

## 2017-01-31 MED ORDER — KETOROLAC TROMETHAMINE 30 MG/ML IJ SOLN
INTRAMUSCULAR | Status: AC
  Filled 2017-01-31: qty 1

## 2017-01-31 MED ORDER — DEXAMETHASONE SODIUM PHOSPHATE 10 MG/ML IJ SOLN
INTRAMUSCULAR | Status: AC
  Filled 2017-01-31: qty 1

## 2017-01-31 MED ORDER — CEFAZOLIN SODIUM-DEXTROSE 2-4 GM/100ML-% IV SOLN
INTRAVENOUS | Status: AC
  Filled 2017-01-31: qty 100

## 2017-01-31 MED ORDER — STERILE WATER FOR IRRIGATION IR SOLN
Status: DC | PRN
  Administered 2017-01-31: 3000 mL

## 2017-01-31 MED ORDER — KETOROLAC TROMETHAMINE 30 MG/ML IJ SOLN
INTRAMUSCULAR | Status: DC | PRN
  Administered 2017-01-31: 15 mg via INTRAVENOUS

## 2017-01-31 MED ORDER — FENTANYL CITRATE (PF) 100 MCG/2ML IJ SOLN
25.0000 ug | INTRAMUSCULAR | Status: DC | PRN
  Administered 2017-01-31 (×2): 25 ug via INTRAVENOUS
  Filled 2017-01-31: qty 1

## 2017-01-31 SURGICAL SUPPLY — 13 items
BAG DRAIN URO-CYSTO SKYTR STRL (DRAIN) ×2 IMPLANT
BAG URINE DRAINAGE (UROLOGICAL SUPPLIES) ×2 IMPLANT
CATH FOLEY 2WAY SLVR  5CC 16FR (CATHETERS) ×2
CATH FOLEY 2WAY SLVR 5CC 16FR (CATHETERS) ×2 IMPLANT
CLOTH BEACON ORANGE TIMEOUT ST (SAFETY) ×2 IMPLANT
GLOVE BIO SURGEON STRL SZ8 (GLOVE) ×2 IMPLANT
GOWN STRL REUS W/TWL XL LVL3 (GOWN DISPOSABLE) ×2 IMPLANT
HOLDER FOLEY CATH W/STRAP (MISCELLANEOUS) ×2 IMPLANT
IV NS IRRIG 3000ML ARTHROMATIC (IV SOLUTION) ×4 IMPLANT
MANIFOLD NEPTUNE II (INSTRUMENTS) ×2 IMPLANT
PACK CYSTO (CUSTOM PROCEDURE TRAY) ×2 IMPLANT
SYSTEM UROLIFT (Male Continence) ×12 IMPLANT
TUBE CONNECTING 12X1/4 (SUCTIONS) ×2 IMPLANT

## 2017-01-31 NOTE — H&P (Signed)
Urology Admission H&P  Chief Complaint: urinary frequency and incomplete emptyin  History of Present Illness: [name] is a 83yo with a hx of BPH who has failed medical therapy.   Past Medical History:  Diagnosis Date  . BPH (benign prostatic hypertrophy)   . Coronary artery disease cardiologist-  [name] (previously dr Domenic Polite)   abnormal stress test 11-07-2015 ; s/p  cardiac cath 11-27-2015 , non-obstructive cad and normal LVF (pLAD to mLAD 20%; mCx 20%)  . Dementia    per wife he has dementia but has not been dx by a md  . Essential hypertension, benign   . Glaucoma, both eyes   . Hypothyroidism   . Loose bowel movements   . Lower urinary tract symptoms (LUTS)   . Mixed hyperlipidemia   . OA (osteoarthritis)   . Personal history of MI (myocardial infarction)    per stress test 11-07-2015 large fixed inferior defect consistent w/ old infarction  . PVC's (premature ventricular contractions)   . RBBB (right bundle branch block)   . Wears glasses    Past Surgical History:  Procedure Laterality Date  . APPENDECTOMY  1948  . CARDIAC CATHETERIZATION N/A 11/27/2015   Procedure: Left Heart Cath and Coronary Angiography;  Surgeon: Burnell Blanks, MD;  Location: [place] [facility];  Service: Cardiovascular;  Laterality: N/A;   MILD NON-OBSTRUCTIVE CAD, NORMAL LVSF, EF 55-65%  . CARDIAC CATHETERIZATION  08-23-2007   dr cooper   essentially normal coronary arteries w/ no obstructive cad, normal lvf, ef 65%  . CARDIOVASCULAR STRESS TEST  11-07-2015   [facility], [place]   Intermediate risk nuclear study w/ large fixed inferior defect noted consistent with old infarction, mild reversibility in the inferior wall concerning for ischemia/  normal LV function and wall motion , ef 57%  . CATARACT EXTRACTION W/ INTRAOCULAR LENS  IMPLANT, BILATERAL  2015  approx.  Marland Kitchen KNEE ARTHROPLASTY Left 02/19/2016   Procedure: LEFT TOTAL KNEE ARTHROPLASTY WITH COMPUTER NAVIGATION;  Surgeon:  Rod Can, MD;  Location: [facility];  Service: Orthopedics;  Laterality: Left;  . TONSILLECTOMY  child    Home Medications:  Current Facility-Administered Medications  Medication Dose Route Frequency Provider Last Rate Last Dose  . ceFAZolin (ANCEF) IVPB 2g/100 mL premix  2 g Intravenous 30 min Pre-Op Gryphon Vanderveen, Candee Furbish, MD      . lactated ringers infusion   Intravenous Continuous Myrtie Soman, MD       Allergies: No Known Allergies  Family History  Problem Relation Age of Onset  . Colon cancer Mother   . Other Father        MVA   Social History:  reports that he quit smoking about 56 years ago. His smoking use included cigarettes. He has a 30.00 pack-year smoking history. he has never used smokeless tobacco. He reports that he does not drink alcohol or use drugs.  Review of Systems  Genitourinary: Positive for frequency and urgency.  All other systems reviewed and are negative.   Physical Exam:  Vital signs in last 24 hours: Temp:  [97.6 F (36.4 C)] 97.6 F (36.4 C) (12/03 1303) Pulse Rate:  [50] 50 (12/03 1303) Resp:  [16] 16 (12/03 1303) BP: (148)/(83) 148/83 (12/03 1303) SpO2:  [96 %] 96 % (12/03 1303) Weight:  [96.6 kg (213 lb)] 96.6 kg (213 lb) (12/03 1303) Physical Exam  Constitutional: He is oriented to person, place, and time. He appears well-developed and well-nourished.  HENT:  Head: Normocephalic and  atraumatic.  Eyes: EOM are normal. Pupils are equal, round, and reactive to light.  Neck: Normal range of motion. No thyromegaly present.  Cardiovascular: Normal rate and regular rhythm.  Respiratory: Effort normal. No respiratory distress.  GI: Soft. He exhibits no distension.  Musculoskeletal: Normal range of motion. He exhibits no edema.  Neurological: He is alert and oriented to person, place, and time.  Skin: Skin is warm and dry.  Psychiatric: He has a normal mood and affect. His behavior is normal. Judgment and thought content normal.    Laboratory  Data:  No results found for this or any previous visit (from the past 24 hour(s)). No results found for this or any previous visit (from the past 240 hour(s)). Creatinine: No results for input(s): CREATININE in the last 168 hours. Baseline Creatinine: uunknown  Impression/Assessment:  83yo with BPH with urinary frequency refractory to medical therapy  Plan:  The risks/benefits/alternatives to Urolift was explained to the patient and he understands and wishes to proceed with surgery  Nicolette Bang 01/31/2017, 1:37 PM

## 2017-01-31 NOTE — Discharge Instructions (Signed)
°Post Anesthesia Home Care Instructions ° °Activity: °Get plenty of rest for the remainder of the day. A responsible individual must stay with you for 24 hours following the procedure.  °For the next 24 hours, DO NOT: °-Drive a car °-Operate machinery °-Drink alcoholic beverages °-Take any medication unless instructed by your physician °-Make any legal decisions or sign important papers. ° °Meals: °Start with liquid foods such as gelatin or soup. Progress to regular foods as tolerated. Avoid greasy, spicy, heavy foods. If nausea and/or vomiting occur, drink only clear liquids until the nausea and/or vomiting subsides. Call your physician if vomiting continues. ° °Special Instructions/Symptoms: °Your throat may feel dry or sore from the anesthesia or the breathing tube placed in your throat during surgery. If this causes discomfort, gargle with warm salt water. The discomfort should disappear within 24 hours. ° °If you had a scopolamine patch placed behind your ear for the management of post- operative nausea and/or vomiting: ° °1. The medication in the patch is effective for 72 hours, after which it should be removed.  Wrap patch in a tissue and discard in the trash. Wash hands thoroughly with soap and water. °2. You may remove the patch earlier than 72 hours if you experience unpleasant side effects which may include dry mouth, dizziness or visual disturbances. °3. Avoid touching the patch. Wash your hands with soap and water after contact with the patch. °  °Indwelling Urinary Catheter Care, Adult °Take good care of your catheter to keep it working and to prevent problems. °How to wear your catheter °Attach your catheter to your leg with tape (adhesive tape) or a leg strap. Make sure it is not too tight. If you use tape, remove any bits of tape that are already on the catheter. °How to wear a drainage bag °You should have: °· A large overnight bag. °· A small leg bag. ° °Overnight Bag °You may wear the overnight  bag at any time. Always keep the bag below the level of your bladder but off the floor. When you sleep, put a clean plastic bag in a wastebasket. Then hang the bag inside the wastebasket. °Leg Bag °Never wear the leg bag at night. Always wear the leg bag below your knee. Keep the leg bag secure with a leg strap or tape. °How to care for your skin °· Clean the skin around the catheter at least once every day. °· Shower every day. Do not take baths. °· Put creams, lotions, or ointments on your genital area only as told by your doctor. °· Do not use powders, sprays, or lotions on your genital area. °How to clean your catheter and your skin °1. Wash your hands with soap and water. °2. Wet a washcloth in warm water and gentle (mild) soap. °3. Use the washcloth to clean the skin where the catheter enters your body. Clean downward and wipe away from the catheter in small circles. Do not wipe toward the catheter. °4. Pat the area dry with a clean towel. Make sure to clean off all soap. °How to care for your drainage bags °Empty your drainage bag when it is ?-½ full or at least 2-3 times a day. Replace your drainage bag once a month or sooner if it starts to smell bad or look dirty. Do not clean your drainage bag unless told by your doctor. °Emptying a drainage bag ° °Supplies Needed °· Rubbing alcohol. °· Gauze pad or cotton ball. °· Tape or a leg strap. ° °Steps °  1. Wash your hands with soap and water. °2. Separate (detach) the bag from your leg. °3. Hold the bag over the toilet or a clean container. Keep the bag below your hips and bladder. This stops pee (urine) from going back into the tube. °4. Open the pour spout at the bottom of the bag. °5. Empty the pee into the toilet or container. Do not let the pour spout touch any surface. °6. Put rubbing alcohol on a gauze pad or cotton ball. °7. Use the gauze pad or cotton ball to clean the pour spout. °8. Close the pour spout. °9. Attach the bag to your leg with tape or a  leg strap. °10. Wash your hands. ° °Changing a drainage bag °Supplies Needed °· Alcohol wipes. °· A clean drainage bag. °· Adhesive tape or a leg strap. ° °Steps °1. Wash your hands with soap and water. °2. Separate the dirty bag from your leg. °3. Pinch the rubber catheter with your fingers so that pee does not spill out. °4. Separate the catheter tube from the drainage tube where these tubes connect (at the connection valve). Do not let the tubes touch any surface. °5. Clean the end of the catheter tube with an alcohol wipe. Use a different alcohol wipe to clean the end of the drainage tube. °6. Connect the catheter tube to the drainage tube of the clean bag. °7. Attach the new bag to the leg with adhesive tape or a leg strap. °8. Wash your hands. ° °How to prevent infection and other problems °· Never pull on your catheter or try to remove it. Pulling can damage tissue in your body. °· Always wash your hands before and after touching your catheter. °· If a leg strap gets wet, replace it with a dry one. °· Drink enough fluids to keep your pee clear or pale yellow, or as told by your doctor. °· Do not let the drainage bag or tubing touch the floor. °· Wear cotton underwear. °· If you are male, wipe from front to back after you poop (have a bowel movement). °· Check on the catheter often to make sure it works and the tubing is not twisted. °Get help if: °· Your pee is cloudy. °· Your pee smells unusually bad. °· Your pee is not draining into the bag. °· Your tube gets clogged. °· Your catheter starts to leak. °· Your bladder feels full. °Get help right away if: °· You have redness, swelling, or pain where the catheter enters your body. °· You have fluid, pus, or a bad smell coming from the area where the catheter enters your body. °· The area where the catheter enters your body feels warm. °· You have a fever. °· You have pain in your: °? Stomach (abdomen). °? Legs. °? Lower back. °? Bladder. °· You see blood fill  the catheter. °· Your pee is pink or red. °· You feel sick to your stomach (nauseous). °· You throw up (vomit). °· You have chills. °· Your catheter gets pulled out. °This information is not intended to replace advice given to you by your health care provider. Make sure you discuss any questions you have with your health care provider. °Document Released: 06/12/2012 Document Revised: 01/14/2016 Document Reviewed: 07/31/2013 °Elsevier Interactive Patient Education © 2018 [employer] ° °

## 2017-01-31 NOTE — Anesthesia Procedure Notes (Signed)
Procedure Name: MAC Date/Time: 01/31/2017 2:40 PM Performed by: Justice Rocher, CRNA Pre-anesthesia Checklist: Patient identified, Timeout performed, Patient being monitored, Suction available and Emergency Drugs available Patient Re-evaluated:Patient Re-evaluated prior to induction Oxygen Delivery Method: Simple face mask Preoxygenation: Pre-oxygenation with 100% oxygen Induction Type: IV induction Placement Confirmation: positive ETCO2 and breath sounds checked- equal and bilateral

## 2017-01-31 NOTE — Transfer of Care (Signed)
Immediate Anesthesia Transfer of Care Note  Patient: Kurt Segal Juday Sr.  Procedure(s) Performed: Procedure(s) (LRB): CYSTOSCOPY WITH INSERTION OF UROLIFT (N/A)  Patient Location: PACU  Anesthesia Type: General  Level of Consciousness: awake, sedated, patient cooperative and responds to stimulation  Airway & Oxygen Therapy: Patient Spontanous Breathing and Patient on RA   Post-op Assessment: Report given to PACU RN, Post -op Vital signs reviewed and stable and Patient moving all extremities  Post vital signs: Reviewed and stable  Complications: No apparent anesthesia complications

## 2017-01-31 NOTE — Anesthesia Preprocedure Evaluation (Signed)
Anesthesia Evaluation  Patient identified by MRN, date of birth, ID band Patient awake    Reviewed: Allergy & Precautions, NPO status , Patient's Chart, lab work & pertinent test results  Airway Mallampati: II  TM Distance: >3 FB Neck ROM: Full    Dental no notable dental hx.    Pulmonary neg pulmonary ROS, former smoker,    Pulmonary exam normal breath sounds clear to auscultation       Cardiovascular hypertension, + CAD and + Past MI  Normal cardiovascular exam Rhythm:Regular Rate:Normal     Neuro/Psych Dementia  negative neurological ROS  negative psych ROS   GI/Hepatic negative GI ROS, Neg liver ROS,   Endo/Other  negative endocrine ROS  Renal/GU negative Renal ROS  negative genitourinary   Musculoskeletal negative musculoskeletal ROS (+)   Abdominal   Peds negative pediatric ROS (+)  Hematology negative hematology ROS (+)   Anesthesia Other Findings   Reproductive/Obstetrics negative OB ROS                             Anesthesia Physical Anesthesia Plan  ASA: III  Anesthesia Plan: MAC   Post-op Pain Management:    Induction: Intravenous  PONV Risk Score and Plan: 0  Airway Management Planned: Simple Face Mask  Additional Equipment:   Intra-op Plan:   Post-operative Plan:   Informed Consent: I have reviewed the patients History and Physical, chart, labs and discussed the procedure including the risks, benefits and alternatives for the proposed anesthesia with the patient or authorized representative who has indicated his/her understanding and acceptance.   Dental advisory given  Plan Discussed with: CRNA and Surgeon  Anesthesia Plan Comments:         Anesthesia Quick Evaluation

## 2017-01-31 NOTE — Anesthesia Postprocedure Evaluation (Signed)
Anesthesia Post Note  Patient: Kurt Cobbins Moten Sr.  Procedure(s) Performed: CYSTOSCOPY WITH INSERTION OF UROLIFT (N/A )     Patient location during evaluation: PACU Anesthesia Type: MAC Level of consciousness: awake Pain management: pain level controlled Vital Signs Assessment: post-procedure vital signs reviewed and stable Cardiovascular status: stable Postop Assessment: no apparent nausea or vomiting Anesthetic complications: no    Last Vitals:  Vitals:   01/31/17 1545 01/31/17 1600  BP: (!) 163/76 (!) 164/88  Pulse: (!) 50 (!) 46  Resp: 16 14  Temp:    SpO2: 94% 95%    Last Pain:  Vitals:   01/31/17 1600  TempSrc:   PainSc: 4    Pain Goal: Patients Stated Pain Goal: 5 (01/31/17 1303)               Ginger Blue

## 2017-01-31 NOTE — Op Note (Signed)
   PREOPERATIVE DIAGNOSIS: Benign prostatic hypertrophy with incomplete bladder emptying    POSTOPERATIVE DIAGNOSIS:same  PROCEDURE: Cystoscopy with implantation of UroLift devices, 6 implants.  SURGEON: Nicolette Bang, M.D.  ANESTHESIA: General  ANTIBIOTICS: ancef  SPECIMEN: None.  DRAINS: A 16-French Foley catheter.  BLOOD LOSS: Minimal.  COMPLICATIONS: None.  INDICATIONS:The Patient is an 84 year old white male with BPH and Incomplete bladder emptying. He has failed medical therapy and has elected UroLift for definitive treatment.  FINDINGS OF PROCEDURE: He was taken to the operating room where a genral anesthetic was induced. He was placed in lithotomy position and was fitted with PAS hose. His perineum and genitalia were prepped with chlorhexidine, and he was draped in usual sterile fashion.  Cystoscopy was performed using the UroLift scope and 0 degree lens. Examination revealed a normal urethra. The external sphincter was intact. Prostatic urethra was approximately 6 cm in length with lateral lobe enlargement. There was also little bit of bladder neck elevation. Inspection of bladder revealed mild-to-moderate trabeculation with no tumors, stones, or inflammation. No cellules or diverticula were noted. Ureteral orifices were in their normal anatomic position effluxing clear urine.  After initial cystoscopy, the visual obturator was replaced with the first UroLift device. This was turned to the 9 o'clock position and pulled back to the veru and then slightly advanced. Pressure was then applied to the right lateral lobe and the UroLift device was deployed.  The second UroLift device was then inserted and applied to the left lateral lobe at 3 o'clock and deployed in the mid prostatic urethra. After this, there was still some apparent obstruction closer to the bladder neck. So a second level of UroLift your left device was applied between the  mid urethra and the proximal urethra providing further patency to the prostatic urethra. At this point, there was mild bleeding but the patient did have a spinal anesthetic. So it was thought that a Foley catheter was indicated. The scope was removed and a 16-French Foley catheter was inserted without difficulty. The balloon was filled with 10 mL sterile fluid, and the catheter was placed to straight drainage.  COMPLICATIONS: None   CONDITION: Stable, extubated, transferred to PACU  PLAN: The patient will be discharged home and followup in 2 days for a voiding trial.

## 2017-02-01 ENCOUNTER — Encounter (HOSPITAL_BASED_OUTPATIENT_CLINIC_OR_DEPARTMENT_OTHER): Payer: Self-pay | Admitting: Urology

## 2017-02-01 LAB — POCT I-STAT 4, (NA,K, GLUC, HGB,HCT)
Glucose, Bld: 94 mg/dL (ref 65–99)
HCT: 40 % (ref 39.0–52.0)
HEMOGLOBIN: 13.6 g/dL (ref 13.0–17.0)
Potassium: 3.7 mmol/L (ref 3.5–5.1)
SODIUM: 142 mmol/L (ref 135–145)

## 2017-02-02 ENCOUNTER — Other Ambulatory Visit (HOSPITAL_COMMUNITY)
Admission: RE | Admit: 2017-02-02 | Discharge: 2017-02-02 | Disposition: A | Discharge status: Home / Self Care | Payer: Medicare HMO | Source: Other Acute Inpatient Hospital | Attending: Urology | Admitting: Urology

## 2017-02-02 ENCOUNTER — Ambulatory Visit (INDEPENDENT_AMBULATORY_CARE_PROVIDER_SITE_OTHER): Payer: Medicare HMO | Admitting: Urology

## 2017-02-02 DIAGNOSIS — N401 Enlarged prostate with lower urinary tract symptoms: Secondary | ICD-10-CM | POA: Insufficient documentation

## 2017-02-02 DIAGNOSIS — N3001 Acute cystitis with hematuria: Secondary | ICD-10-CM | POA: Diagnosis not present

## 2017-02-03 LAB — URINE CULTURE: CULTURE: NO GROWTH

## 2017-02-09 ENCOUNTER — Ambulatory Visit: Payer: Medicare HMO | Admitting: Urology

## 2017-02-09 DIAGNOSIS — N401 Enlarged prostate with lower urinary tract symptoms: Secondary | ICD-10-CM | POA: Diagnosis not present

## 2017-02-13 ENCOUNTER — Inpatient Hospital Stay
Admission: AD | Admit: 2017-02-13 | Payer: Self-pay | Source: Other Acute Inpatient Hospital | Admitting: Family Medicine

## 2017-02-13 DIAGNOSIS — R319 Hematuria, unspecified: Secondary | ICD-10-CM | POA: Diagnosis not present

## 2017-02-13 DIAGNOSIS — I129 Hypertensive chronic kidney disease with stage 1 through stage 4 chronic kidney disease, or unspecified chronic kidney disease: Secondary | ICD-10-CM | POA: Diagnosis not present

## 2017-02-13 DIAGNOSIS — E876 Hypokalemia: Secondary | ICD-10-CM | POA: Diagnosis not present

## 2017-02-13 DIAGNOSIS — R112 Nausea with vomiting, unspecified: Secondary | ICD-10-CM | POA: Diagnosis not present

## 2017-02-13 DIAGNOSIS — K8070 Calculus of gallbladder and bile duct without cholecystitis without obstruction: Secondary | ICD-10-CM | POA: Diagnosis not present

## 2017-02-13 DIAGNOSIS — K851 Biliary acute pancreatitis without necrosis or infection: Secondary | ICD-10-CM | POA: Diagnosis not present

## 2017-02-13 DIAGNOSIS — R0989 Other specified symptoms and signs involving the circulatory and respiratory systems: Secondary | ICD-10-CM | POA: Diagnosis not present

## 2017-02-13 DIAGNOSIS — E860 Dehydration: Secondary | ICD-10-CM | POA: Diagnosis not present

## 2017-02-13 DIAGNOSIS — N179 Acute kidney failure, unspecified: Secondary | ICD-10-CM | POA: Diagnosis not present

## 2017-02-13 DIAGNOSIS — K831 Obstruction of bile duct: Secondary | ICD-10-CM | POA: Diagnosis not present

## 2017-02-13 DIAGNOSIS — I517 Cardiomegaly: Secondary | ICD-10-CM | POA: Diagnosis not present

## 2017-02-13 DIAGNOSIS — H409 Unspecified glaucoma: Secondary | ICD-10-CM | POA: Diagnosis not present

## 2017-02-13 DIAGNOSIS — J9811 Atelectasis: Secondary | ICD-10-CM | POA: Diagnosis not present

## 2017-02-13 DIAGNOSIS — K8050 Calculus of bile duct without cholangitis or cholecystitis without obstruction: Secondary | ICD-10-CM | POA: Diagnosis not present

## 2017-02-13 DIAGNOSIS — R1084 Generalized abdominal pain: Secondary | ICD-10-CM | POA: Diagnosis not present

## 2017-02-13 DIAGNOSIS — R69 Illness, unspecified: Secondary | ICD-10-CM | POA: Diagnosis not present

## 2017-02-13 DIAGNOSIS — E872 Acidosis: Secondary | ICD-10-CM | POA: Diagnosis not present

## 2017-02-13 DIAGNOSIS — R918 Other nonspecific abnormal finding of lung field: Secondary | ICD-10-CM | POA: Diagnosis not present

## 2017-02-13 DIAGNOSIS — A419 Sepsis, unspecified organism: Secondary | ICD-10-CM | POA: Diagnosis not present

## 2017-02-13 DIAGNOSIS — R7989 Other specified abnormal findings of blood chemistry: Secondary | ICD-10-CM | POA: Diagnosis not present

## 2017-02-13 DIAGNOSIS — D72829 Elevated white blood cell count, unspecified: Secondary | ICD-10-CM | POA: Diagnosis not present

## 2017-02-13 DIAGNOSIS — N390 Urinary tract infection, site not specified: Secondary | ICD-10-CM | POA: Diagnosis not present

## 2017-02-13 DIAGNOSIS — E039 Hypothyroidism, unspecified: Secondary | ICD-10-CM | POA: Diagnosis not present

## 2017-02-13 DIAGNOSIS — N323 Diverticulum of bladder: Secondary | ICD-10-CM | POA: Diagnosis not present

## 2017-02-13 DIAGNOSIS — N400 Enlarged prostate without lower urinary tract symptoms: Secondary | ICD-10-CM | POA: Diagnosis not present

## 2017-02-13 DIAGNOSIS — I10 Essential (primary) hypertension: Secondary | ICD-10-CM | POA: Diagnosis not present

## 2017-02-13 DIAGNOSIS — R197 Diarrhea, unspecified: Secondary | ICD-10-CM | POA: Diagnosis not present

## 2017-02-13 DIAGNOSIS — I444 Left anterior fascicular block: Secondary | ICD-10-CM | POA: Diagnosis not present

## 2017-02-13 DIAGNOSIS — H918X9 Other specified hearing loss, unspecified ear: Secondary | ICD-10-CM | POA: Diagnosis not present

## 2017-02-13 DIAGNOSIS — R1012 Left upper quadrant pain: Secondary | ICD-10-CM | POA: Diagnosis not present

## 2017-02-13 DIAGNOSIS — N189 Chronic kidney disease, unspecified: Secondary | ICD-10-CM | POA: Diagnosis not present

## 2017-02-13 DIAGNOSIS — K8030 Calculus of bile duct with cholangitis, unspecified, without obstruction: Secondary | ICD-10-CM | POA: Diagnosis not present

## 2017-02-13 DIAGNOSIS — E1165 Type 2 diabetes mellitus with hyperglycemia: Secondary | ICD-10-CM | POA: Diagnosis not present

## 2017-02-25 DIAGNOSIS — I10 Essential (primary) hypertension: Secondary | ICD-10-CM | POA: Diagnosis not present

## 2017-02-25 DIAGNOSIS — N182 Chronic kidney disease, stage 2 (mild): Secondary | ICD-10-CM | POA: Diagnosis not present

## 2017-02-25 DIAGNOSIS — E559 Vitamin D deficiency, unspecified: Secondary | ICD-10-CM | POA: Diagnosis not present

## 2017-03-03 DIAGNOSIS — H401133 Primary open-angle glaucoma, bilateral, severe stage: Secondary | ICD-10-CM | POA: Diagnosis not present

## 2017-03-03 DIAGNOSIS — H40052 Ocular hypertension, left eye: Secondary | ICD-10-CM | POA: Diagnosis not present

## 2017-03-03 DIAGNOSIS — N401 Enlarged prostate with lower urinary tract symptoms: Secondary | ICD-10-CM | POA: Diagnosis not present

## 2017-03-03 DIAGNOSIS — R300 Dysuria: Secondary | ICD-10-CM | POA: Diagnosis not present

## 2017-03-08 ENCOUNTER — Encounter: Payer: Self-pay | Admitting: General Surgery

## 2017-03-08 ENCOUNTER — Ambulatory Visit: Payer: Medicare HMO | Admitting: General Surgery

## 2017-03-08 VITALS — BP 151/79 | HR 57 | Temp 97.8°F | Ht 67.0 in | Wt 202.0 lb

## 2017-03-08 DIAGNOSIS — K8070 Calculus of gallbladder and bile duct without cholecystitis without obstruction: Secondary | ICD-10-CM

## 2017-03-08 NOTE — Patient Instructions (Signed)

## 2017-03-08 NOTE — Progress Notes (Signed)
Kurt Syme Idrovo Sr.; [id number]; 1932/04/10   HPI Patient is an 84 year old white male who was referred to my care by Dr. Pleas Koch for evaluation and treatment of gallstone pancreatitis.  He was transferred from [facility] February 13, 2017 for cholangitis to [facility]  He underwent ERCP with removal of a common bile duct stone.  In addition, the pancreatic stent was placed.  The patient currently has no significant abdominal pain or fevers.  He does have dementia and thus his history is limited.  There is been no recent nausea or vomiting.  He was referred to my care for cholecystectomy.  He has not had a follow-up with GI yet. Past Medical History:  Diagnosis Date  . BPH (benign prostatic hypertrophy)   . Coronary artery disease cardiologist-  [name] (previously dr Domenic Polite)   abnormal stress test 11-07-2015 ; s/p  cardiac cath 11-27-2015 , non-obstructive cad and normal LVF (pLAD to mLAD 20%; mCx 20%)  . Dementia    per wife he has dementia but has not been dx by a md  . Essential hypertension, benign   . Glaucoma, both eyes   . Hypothyroidism   . Loose bowel movements   . Lower urinary tract symptoms (LUTS)   . Mixed hyperlipidemia   . OA (osteoarthritis)   . Personal history of MI (myocardial infarction)    per stress test 11-07-2015 large fixed inferior defect consistent w/ old infarction  . PVC's (premature ventricular contractions)   . RBBB (right bundle branch block)   . Wears glasses     Past Surgical History:  Procedure Laterality Date  . APPENDECTOMY  1948  . CARDIAC CATHETERIZATION N/A 11/27/2015   Procedure: Left Heart Cath and Coronary Angiography;  Surgeon: Burnell Blanks, MD;  Location: [place] [facility];  Service: Cardiovascular;  Laterality: N/A;   MILD NON-OBSTRUCTIVE CAD, NORMAL LVSF, EF 55-65%  . CARDIAC CATHETERIZATION  08-23-2007   dr cooper   essentially normal coronary arteries w/ no obstructive cad, normal lvf, ef  65%  . CARDIOVASCULAR STRESS TEST  11-07-2015   [facility] [place]   Intermediate risk nuclear study w/ large fixed inferior defect noted consistent with old infarction, mild reversibility in the inferior wall concerning for ischemia/  normal LV function and wall motion , ef 57%  . CATARACT EXTRACTION W/ INTRAOCULAR LENS  IMPLANT, BILATERAL  2015  approx.  . CYSTOSCOPY WITH INSERTION OF UROLIFT N/A 01/31/2017   Procedure: CYSTOSCOPY WITH INSERTION OF UROLIFT;  Surgeon: Cleon Gustin, MD;  [facility];  Service: Urology;  Laterality: N/A;  . KNEE ARTHROPLASTY Left 02/19/2016   Procedure: LEFT TOTAL KNEE ARTHROPLASTY WITH COMPUTER NAVIGATION;  Surgeon: Rod Can, MD;  Location: [facility];  Service: Orthopedics;  Laterality: Left;  . TONSILLECTOMY  child    Family History  Problem Relation Age of Onset  . Colon cancer Mother   . Other Father        MVA    Current Outpatient Medications on File Prior to Visit  Medication Sig Dispense Refill  . atenolol-chlorthalidone (TENORETIC) 100-25 MG per tablet Take 1 tablet by mouth every other day. Per pt wife has not taken or taking as prescribed due to them thinking bp to low to take    . diclofenac (VOLTAREN) 75 MG EC tablet Take 75 mg by mouth 2 (two) times daily.     . diphenhydrAMINE (BENADRYL) 25 MG tablet Take 25 mg by mouth every 6 (  six) hours as needed.    . finasteride (PROSCAR) 5 MG tablet Take 5 mg by mouth every morning.     . latanoprost (XALATAN) 0.005 % ophthalmic solution Place 1 drop into both eyes at bedtime.     Marland Kitchen levothyroxine (SYNTHROID, LEVOTHROID) 25 MCG tablet Take 25 mcg by mouth daily before breakfast.     . Multiple Vitamins-Minerals (OCUVITE EXTRA PO) Take 1 tablet by mouth daily.    . nitroGLYCERIN (NITROSTAT) 0.4 MG SL tablet Place 1 tablet (0.4 mg total) under the tongue every 5 (five) minutes as needed for chest pain. 25 tablet 3  . PRESCRIPTION MEDICATION Place 1 drop into the  left eye 2 (two) times daily. Per wife unsure name of eye drops for glaucoma that is for left eye twice daily    . traMADol (ULTRAM) 50 MG tablet Take 1 tablet (50 mg total) by mouth every 6 (six) hours as needed. 30 tablet 0   No current facility-administered medications on file prior to visit.     No Known Allergies  Social History   Substance and Sexual Activity  Alcohol Use No    Social History   Tobacco Use  Smoking Status Former Smoker  . Packs/day: 1.00  . Years: 30.00  . Pack years: 30.00  . Types: Cigarettes  . Last attempt to quit: 03/01/1960  . Years since quitting: 57.0  Smokeless Tobacco Never Used    Review of Systems  Unable to perform ROS: Dementia    Objective   Vitals:   03/08/17 1112  BP: (!) 151/79  Pulse: (!) 57  Temp: 97.8 F (36.6 C)    Physical Exam  Constitutional: He is oriented to person, place, and time and well-developed, well-nourished, and in no distress.  HENT:  Head: Normocephalic and atraumatic.  Eyes: No scleral icterus.  Cardiovascular: Normal rate, regular rhythm and normal heart sounds. Exam reveals no gallop and no friction rub.  No murmur heard. Pulmonary/Chest: Effort normal and breath sounds normal. No respiratory distress. He has no wheezes. He has no rales.  Abdominal: Soft. Bowel sounds are normal. He exhibits no distension. There is no tenderness. There is no rebound.  Neurological: He is alert and oriented to person, place, and time.  Skin: Skin is warm and dry.  Vitals reviewed.  Records from [facility] admission reviewed. Assessment  History of gallstone pancreatitis, cholangitis, status post ERCP with stone extraction and pancreatic stent placement Plan   Records do indicate that he does have cholelithiasis.  He will need repeat ultrasound to confirm this.  I have also referred him to Dr. Laural Golden for follow-up evaluation of his ERCP and stent placement.  Will discuss with him the timing of his cholecystectomy.

## 2017-03-14 ENCOUNTER — Encounter (INDEPENDENT_AMBULATORY_CARE_PROVIDER_SITE_OTHER): Payer: Self-pay

## 2017-03-15 ENCOUNTER — Encounter (INDEPENDENT_AMBULATORY_CARE_PROVIDER_SITE_OTHER): Payer: Self-pay | Admitting: Internal Medicine

## 2017-03-15 ENCOUNTER — Ambulatory Visit (INDEPENDENT_AMBULATORY_CARE_PROVIDER_SITE_OTHER): Payer: Medicare HMO | Admitting: Internal Medicine

## 2017-03-15 ENCOUNTER — Other Ambulatory Visit (INDEPENDENT_AMBULATORY_CARE_PROVIDER_SITE_OTHER): Payer: Self-pay | Admitting: Internal Medicine

## 2017-03-15 VITALS — BP 118/80 | HR 76 | Temp 97.4°F | Resp 18 | Ht 66.0 in | Wt 202.2 lb

## 2017-03-15 DIAGNOSIS — Z8719 Personal history of other diseases of the digestive system: Secondary | ICD-10-CM

## 2017-03-15 DIAGNOSIS — Z4689 Encounter for fitting and adjustment of other specified devices: Secondary | ICD-10-CM | POA: Diagnosis not present

## 2017-03-15 DIAGNOSIS — K8020 Calculus of gallbladder without cholecystitis without obstruction: Secondary | ICD-10-CM

## 2017-03-15 NOTE — Patient Instructions (Signed)
ERCP with biliary stent removal to be scheduled.

## 2017-03-15 NOTE — Progress Notes (Signed)
Reason for consultation.;  Biliary stent removal.  History of present illness.:  Patient is 84 year old Caucasian male who is referred through the courtesy of Dr. Florene Route for biliary stent removal as patient declined to go back to [facility] where he had ERCP with sphincterotomy stone extraction and biliary stenting.  Patient's present illness began about a month ago when he was evaluated at [facility] for abdominal pain and diagnosed with biliary pancreatitis and cholangitis.  CT scan revealed changes of pancreatitis and single small stone in bile duct.  Patient was transferred to [facility].  He underwent ERCP on 02/14/2017 by Dr. Harley Hallmark and found to have a small stone in bile duct.  Papillotomy/biliary sphincterotomy was performed with removal of the stone.  Since he had marked edema to ampulla biliary stent was placed and patient started to feel better.  He also had acute kidney injury and develop acute hospital delirium for which he has recovered.  Patient was advised to have his gallbladder removed.  He was therefore seen by Dr. Aviva Signs who wanted him to have his stent removed prior to surgery just in case he has more stones.  Patient wanted to have this procedure locally and he is therefore here accompanied by his wife.  Patient denies abdominal pain today.  According to his wife he has had intermittent epigastric pain and had single episode of emesis.  He has not experienced fever chills or night sweats.  His appetite has improved.  He has a history of constipation and has been on polyethylene glycol which she is not taking lately and he is having intermittent explosive bowel movements.  He denies melena or rectal bleeding.  He has lost 20 pounds in the last 1 month.  However he has not lost any weight in the last 1 week.  According to his wife he has been sleeping too much.  He tends to be up at night and sleeps during the daytime.  However he has not been confused or  agitated.    Current Medications: Outpatient Encounter Medications as of 03/15/2017  Medication Sig  . atenolol-chlorthalidone (TENORETIC) 100-25 MG per tablet Take 1 tablet by mouth daily. Per pt wife has not taken or taking as prescribed due to them thinking bp to low to take  . diphenhydrAMINE (BENADRYL) 25 MG tablet Take 50 mg by mouth at bedtime.   . dorzolamide (TRUSOPT) 2 % ophthalmic solution Apply to eye. 1 drop in the left eye twice a day  . finasteride (PROSCAR) 5 MG tablet Take 5 mg by mouth every morning.   . latanoprost (XALATAN) 0.005 % ophthalmic solution Place 1 drop into both eyes at bedtime.   Marland Kitchen levothyroxine (SYNTHROID, LEVOTHROID) 25 MCG tablet Take 25 mcg by mouth daily before breakfast.   . Multiple Vitamins-Minerals (OCUVITE EXTRA PO) Take 1 tablet by mouth daily.  Marland Kitchen PRESCRIPTION MEDICATION Place 1 drop into the left eye 2 (two) times daily. Per wife unsure name of eye drops for glaucoma that is for left eye twice daily  . [DISCONTINUED] diclofenac (VOLTAREN) 75 MG EC tablet Take 75 mg by mouth 2 (two) times daily.   . [DISCONTINUED] nitroGLYCERIN (NITROSTAT) 0.4 MG SL tablet Place 1 tablet (0.4 mg total) under the tongue every 5 (five) minutes as needed for chest pain. (Patient not taking: Reported on 03/15/2017)  . [DISCONTINUED] traMADol (ULTRAM) 50 MG tablet Take 1 tablet (50 mg total) by mouth every 6 (six) hours as needed. (Patient not taking: Reported on 03/15/2017)  No facility-administered encounter medications on file as of 03/15/2017.    Past medical history: Past Medical History:  Diagnosis Date  . BPH (benign prostatic hypertrophy)   . Coronary artery disease cardiologist-  [name] (previously dr Domenic Polite)   abnormal stress test 11-07-2015 ; s/p  cardiac cath 11-27-2015 , non-obstructive cad and normal LVF (pLAD to mLAD 20%; mCx 20%)  .       Marland Kitchen Essential hypertension, benign   . Glaucoma, both eyes   . Hypothyroidism   .    .    . Mixed  hyperlipidemia   . OA (osteoarthritis)   . Personal history of MI (myocardial infarction)    per stress test 11-07-2015 large fixed inferior defect consistent w/ old infarction  . PVC's (premature ventricular contractions)   . RBBB (right bundle branch block)   .         Biliary pancreatitis and cholangitis December 2018.      ERCP with biliary stenting at [facility].   Past Surgical History:  Procedure Laterality Date  . APPENDECTOMY  1948  . CARDIAC CATHETERIZATION N/A 11/27/2015   Procedure: Left Heart Cath and Coronary Angiography;  Surgeon: Burnell Blanks, MD;  Location: [place] [facility];  Service: Cardiovascular;  Laterality: N/A;   MILD NON-OBSTRUCTIVE CAD, NORMAL LVSF, EF 55-65%  . CARDIAC CATHETERIZATION  08-23-2007   dr cooper   essentially normal coronary arteries w/ no obstructive cad, normal lvf, ef 65%  . CARDIOVASCULAR STRESS TEST  11-07-2015   [facility] [place]   Intermediate risk nuclear study w/ large fixed inferior defect noted consistent with old infarction, mild reversibility in the inferior wall concerning for ischemia/  normal LV function and wall motion , ef 57%  . CATARACT EXTRACTION W/ INTRAOCULAR LENS  IMPLANT, BILATERAL  2015  approx.  . CYSTOSCOPY WITH INSERTION OF UROLIFT N/A 01/31/2017   Procedure: CYSTOSCOPY WITH INSERTION OF UROLIFT;  Surgeon: Cleon Gustin, MD;  Location: [facility];  Service: Urology;  Laterality: N/A;  . KNEE ARTHROPLASTY Left 02/19/2016   Procedure: LEFT TOTAL KNEE ARTHROPLASTY WITH COMPUTER NAVIGATION;  Surgeon: Rod Can, MD;  Location: [facility];  Service: Orthopedics;  Laterality: Left;  . TONSILLECTOMY  child     Allergies: No Known Allergies  Family history: Family history is noncontributory.  He has 2 sisters living.  One sister was in a nursing home for years and died in her 53s.  Social history:  He is married.  He has 2 biologic and 2 stepchildren.  One daughter died in her 53s.  He  is retired.  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  He smokes cigarettes for maybe 2-3 years but quit when he was in his 34s.  He drinks alcohol occasionally.   Physical examination.: Blood pressure 118/80, pulse 76, temperature (!) 97.4 F (36.3 C), temperature source Oral, resp. rate 18, height 5\' 6"  (1.676 m), weight 202 lb 3.2 oz (91.7 kg). Patient is alert and in no acute distress. He has hearing impairment. Conjunctiva is pink. Sclera is nonicteric Oropharyngeal mucosa is normal. No neck masses or thyromegaly noted. Cardiac exam with regular rhythm normal S1 and S2. No murmur or gallop noted. Lungs are clear to auscultation. Abdomen is full.  Bowel sounds are normal.  On palpation abdomen is soft and nontender without organomegaly or masses.  I do believe regarding No LE edema or clubbing noted.  Labs/studies Results: Lab data from  02/25/2017 WBC 12.5, H&H 12.9 and 37.8 and platelet count 606K.  Glucose 101 BUN 12 and creatinine 1.28. Serum sodium 141, potassium 4.1, chloride 101, CO2 25 Serum calcium 8.6 Bilirubin 0.4, AP 76, AST 25, ALT 17 total protein 6.2 and albumin 3.0.  Assessment:  #1.  Patient is 84 year old Caucasian male who was recently treated for biliary pancreatitis and cholangitis complicated by acute kidney  injury from which he appears to have recovered. He has biliary stent in place and it will need to be removed either before or after gallbladder surgery.  Patient is interested in having it removed as soon as possible.  Patient reassured that it is not causing any symptoms and appears to be patent based on normal LFTs recently.  #2.  Thrombocytosis.  May be result of recent pancreatitis.  Lab studies will be repeated prior to ERCP.   Recommendations:  Will schedule patient for ERCP with biliary stent removal in the next week or two. Will repeat CBC LFTs and metabolic 7 prior to the procedure. Reviewed the procedure  and risks with the patient and his wife and they are both agreeable.

## 2017-03-16 ENCOUNTER — Encounter (INDEPENDENT_AMBULATORY_CARE_PROVIDER_SITE_OTHER): Payer: Self-pay | Admitting: *Deleted

## 2017-03-16 DIAGNOSIS — Z8719 Personal history of other diseases of the digestive system: Secondary | ICD-10-CM | POA: Insufficient documentation

## 2017-03-16 NOTE — Patient Instructions (Signed)
Kurt Eden Bascomb Sr.  03/16/2017     @PREFPERIOPPHARMACY @   Your procedure is scheduled on  03/21/2017   Report to [facility] at  830   A.M.  Call this number if you have problems the morning of surgery:  [phone]   Remember:  Do not eat food or drink liquids after midnight.  Take these medicines the morning of surgery with A SIP OF WATER  Atenolol, proscar, levothyroxine.   Do not wear jewelry, make-up or nail polish.  Do not wear lotions, powders, or perfumes, or deodorant.  Do not shave 48 hours prior to surgery.  Men may shave face and neck.  Do not bring valuables to the hospital.  [facility] is not responsible for any belongings or valuables.  Contacts, dentures or bridgework may not be worn into surgery.  Leave your suitcase in the car.  After surgery it may be brought to your room.  For patients admitted to the hospital, discharge time will be determined by your treatment team.  Patients discharged the day of surgery will not be allowed to drive home.   Name and phone number of your driver:   family Special instructions:  None  Please read over the following fact sheets that you were given. Anesthesia Post-op Instructions and Care and Recovery After Surgery       Endoscopic Retrograde Cholangiopancreatogram Endoscopic retrograde cholangiopancreatogram (ERCP) is a procedure that may be used to diagnose or treat problems with the pancreas, bile ducts, liver, and gallbladder. For this procedure, a thin, lighted tube (endoscope) is passed through the mouth, the throat, and down into the areas being checked. The endoscope has a camera that allows the areas to be viewed. Dye is injected and then X-rays are taken to further study the areas. During ERCP, other procedures may also be done to help diagnose or treat problems that are found. For example, stones can be removed, or a tissue sample can be taken out for testing (biopsy). Tell a health care  provider about:  Any allergies you have.  All medicines you are taking, including vitamins, herbs, eye drops, creams, and over-the-counter medicines.  Any problems you or family members have had with anesthetic medicines.  Any blood disorders you have.  Any surgeries you have had.  Any medical conditions you have.  Whether you are pregnant or may be pregnant. What are the risks? Generally, this is a safe procedure. However, problems may occur, including:  Pancreatitis.  Infection.  Bleeding.  Allergic reactions to medicines or dyes.  Accidental punctures in the bowel wall, pancreas, or gallbladder.  Damage to other structures or organs.  What happens before the procedure? Staying hydrated Follow instructions from your health care provider about hydration, which may include:  Up to 2 hours before the procedure - you may continue to drink clear liquids, such as water, clear fruit juice, black coffee, and plain tea.  Eating and drinking restrictions Follow instructions from your health care provider about eating and drinking, which may include:  8 hours before the procedure - stop eating heavy meals or foods such as meat, fried foods, or fatty foods.  6 hours before the procedure - stop eating light meals or foods, such as toast or cereal.  6 hours before the procedure - stop drinking milk or drinks that contain milk.  2 hours before the procedure - stop drinking clear liquids.  General instructions  Ask your health care  provider about: ? Changing or stopping your regular medicines. This is especially important if you are taking diabetes medicines or blood thinners. ? Taking medicines such as aspirin and ibuprofen. These medicines can thin your blood. Do not take these medicines before your procedure if your health care provider instructs you not to.  Plan to have someone take you home from the hospital or clinic.  If you will be going home right after the  procedure, plan to have someone with you for 24 hours. What happens during the procedure?  To lower your risk of infection, your health care team will wash or sanitize their hands.  An IV tube will be inserted into one of your veins.  You will be given one or more of the following: ? A medicine to help you relax (sedative). ? A medicine to numb the throat area (local anesthetic) and prevent gagging. Your throat may be sprayed with this medicine, or you may gargle the medicine. ? A medicine to make you fall asleep (general anesthetic). ? A medicine to lower your risk of infection (antibiotic), inflammation (anti-inflammatory), or both.  You will lie on your left side.  The endoscope will be inserted through your mouth, down the back of the throat, and into the first part of the small intestine (duodenum).  Then a small, plastic tube (cannula) will be passed through the endoscope and directed into the bile duct or pancreatic duct.  Dye will be injected through the cannula to make structures easier to see on an X-ray.  X-rays will be taken to study the biliary and pancreatic passageways. You may be positioned on your abdomen or your back during the X-rays.  A small sample of tissue (biopsy) may be removed for examination, or other procedures may be done to fix problems that are found. The procedure may vary among health care providers and hospitals. What happens after the procedure?  Your blood pressure, heart rate, breathing rate, and blood oxygen level will be monitored until the medicines you were given have worn off.  Your throat may feel slightly sore.  You will not be allowed to eat or drink until numbness subsides.  Do not drive for 24 hours if you were given a sedative. Summary  Endoscopic retrograde cholangiopancreatogram is a procedure that may be used to diagnose or treat problems with the pancreas, bile ducts, liver, and gallbladder.  During ERCP, other procedures may  also be done to help diagnose or treat problems that are found. For example, stones can be removed, or a tissue sample can be taken out for testing (biopsy).  Generally, this is a safe procedure. However, problems may occur, including infection, bleeding, pancreatitis, accidental damage to other structures or organs, and allergic reactions to medicines or dyes.  The procedure may vary among health care providers and hospitals. This information is not intended to replace advice given to you by your health care provider. Make sure you discuss any questions you have with your health care provider. Document Released: 11/10/2000 Document Revised: 01/12/2016 Document Reviewed: 01/12/2016 Elsevier Interactive Patient Education  2017 [employer]  Endoscopic Retrograde Cholangiopancreatogram, Care After This sheet gives you information about how to care for yourself after your procedure. Your health care provider may also give you more specific instructions. If you have problems or questions, contact your health care provider. What can I expect after the procedure? After the procedure, it is common to have:  Soreness in your throat.  Nausea.  Bloating.  Dizziness.  Tiredness (fatigue).  Follow these instructions at home:  Take over-the-counter and prescription medicines only as told by your health care provider.  Do not drive for 24 hours if you were given a medicine to help you relax (sedative) during your procedure. Have someone stay with you for 24 hours after the procedure.  Return to your normal activities as told by your health care provider. Ask your health care provider what activities are safe for you.  Return to eating what you normally do as soon as you feel well enough or as told by your health care provider.  Keep all follow-up visits as told by your health care provider. This is important. Contact a health care provider if:  You have pain in your abdomen that does not get  better with medicine.  You develop signs of infection, such as: ? Chills. ? Feeling unwell. Get help right away if:  You have difficulty swallowing.  You have worsening pain in your throat, chest, or abdomen.  You vomit bright red blood or a substance that looks like coffee grounds.  You have bloody or very black stools.  You have a fever.  You have a sudden increase in swelling (bloating) in your abdomen. Summary  After the procedure, it is common to feel tired and to have some discomfort in your throat.  Contact your health care provider if you have signs of infection-such as chills or feeling unwell-or if you have pain that does not improve with medicine.  Get help right away if you have trouble swallowing, worsening pain, bloody or black vomit, bloody or black stools, a fever, or increased swelling in your abdomen.  Keep all follow-up visits as told by your health care provider. This is important. This information is not intended to replace advice given to you by your health care provider. Make sure you discuss any questions you have with your health care provider. Document Released: 12/06/2012 Document Revised: 01/05/2016 Document Reviewed: 01/05/2016 Elsevier Interactive Patient Education  2017 [place] Anesthesia, Adult General anesthesia is the use of medicines to make a person "go to sleep" (be unconscious) for a medical procedure. General anesthesia is often recommended when a procedure:  Is long.  Requires you to be still or in an unusual position.  Is major and can cause you to lose blood.  Is impossible to do without general anesthesia.  The medicines used for general anesthesia are called general anesthetics. In addition to making you sleep, the medicines:  Prevent pain.  Control your blood pressure.  Relax your muscles.  Tell a health care provider about:  Any allergies you have.  All medicines you are taking, including vitamins,  herbs, eye drops, creams, and over-the-counter medicines.  Any problems you or family members have had with anesthetic medicines.  Types of anesthetics you have had in the past.  Any bleeding disorders you have.  Any surgeries you have had.  Any medical conditions you have.  Any history of heart or lung conditions, such as heart failure, sleep apnea, or chronic obstructive pulmonary disease (COPD).  Whether you are pregnant or may be pregnant.  Whether you use tobacco, alcohol, marijuana, or street drugs.  Any history of Armed forces logistics/support/administrative officer.  Any history of depression or anxiety. What are the risks? Generally, this is a safe procedure. However, problems may occur, including:  Allergic reaction to anesthetics.  Lung and heart problems.  Inhaling food or liquids from your stomach into your lungs (aspiration).  Injury to  nerves.  Waking up during your procedure and being unable to move (rare).  Extreme agitation or a state of mental confusion (delirium) when you wake up from the anesthetic.  Air in the bloodstream, which can lead to stroke.  These problems are more likely to develop if you are having a major surgery or if you have an advanced medical condition. You can prevent some of these complications by answering all of your health care provider's questions thoroughly and by following all pre-procedure instructions. General anesthesia can cause side effects, including:  Nausea or vomiting  A sore throat from the breathing tube.  Feeling cold or shivery.  Feeling tired, washed out, or achy.  Sleepiness or drowsiness.  Confusion or agitation.  What happens before the procedure? Staying hydrated Follow instructions from your health care provider about hydration, which may include:  Up to 2 hours before the procedure - you may continue to drink clear liquids, such as water, clear fruit juice, black coffee, and plain tea.  Eating and drinking restrictions Follow  instructions from your health care provider about eating and drinking, which may include:  8 hours before the procedure - stop eating heavy meals or foods such as meat, fried foods, or fatty foods.  6 hours before the procedure - stop eating light meals or foods, such as toast or cereal.  6 hours before the procedure - stop drinking milk or drinks that contain milk.  2 hours before the procedure - stop drinking clear liquids.  Medicines  Ask your health care provider about: ? Changing or stopping your regular medicines. This is especially important if you are taking diabetes medicines or blood thinners. ? Taking medicines such as aspirin and ibuprofen. These medicines can thin your blood. Do not take these medicines before your procedure if your health care provider instructs you not to. ? Taking new dietary supplements or medicines. Do not take these during the week before your procedure unless your health care provider approves them.  If you are told to take a medicine or to continue taking a medicine on the day of the procedure, take the medicine with sips of water. General instructions   Ask if you will be going home the same day, the following day, or after a longer hospital stay. ? Plan to have someone take you home. ? Plan to have someone stay with you for the first 24 hours after you leave the hospital or clinic.  For 3-6 weeks before the procedure, try not to use any tobacco products, such as cigarettes, chewing tobacco, and e-cigarettes.  You may brush your teeth on the morning of the procedure, but make sure to spit out the toothpaste. What happens during the procedure?  You will be given anesthetics through a mask and through an IV tube in one of your veins.  You may receive medicine to help you relax (sedative).  As soon as you are asleep, a breathing tube may be used to help you breathe.  An anesthesia specialist will stay with you throughout the procedure. He or she  will help keep you comfortable and safe by continuing to give you medicines and adjusting the amount of medicine that you get. He or she will also watch your blood pressure, pulse, and oxygen levels to make sure that the anesthetics do not cause any problems.  If a breathing tube was used to help you breathe, it will be removed before you wake up. The procedure may vary among health care providers  and hospitals. What happens after the procedure?  You will wake up, often slowly, after the procedure is complete, usually in a recovery area.  Your blood pressure, heart rate, breathing rate, and blood oxygen level will be monitored until the medicines you were given have worn off.  You may be given medicine to help you calm down if you feel anxious or agitated.  If you will be going home the same day, your health care provider may check to make sure you can stand, drink, and urinate.  Your health care providers will treat your pain and side effects before you go home.  Do not drive for 24 hours if you received a sedative.  You may: ? Feel nauseous and vomit. ? Have a sore throat. ? Have mental slowness. ? Feel cold or shivery. ? Feel sleepy. ? Feel tired. ? Feel sore or achy, even in parts of your body where you did not have surgery. This information is not intended to replace advice given to you by your health care provider. Make sure you discuss any questions you have with your health care provider. Document Released: 05/25/2007 Document Revised: 07/29/2015 Document Reviewed: 01/30/2015 Elsevier Interactive Patient Education  2018 [place] Anesthesia, Adult, Care After These instructions provide you with information about caring for yourself after your procedure. Your health care provider may also give you more specific instructions. Your treatment has been planned according to current medical practices, but problems sometimes occur. Call your health care provider if you have  any problems or questions after your procedure. What can I expect after the procedure? After the procedure, it is common to have:  Vomiting.  A sore throat.  Mental slowness.  It is common to feel:  Nauseous.  Cold or shivery.  Sleepy.  Tired.  Sore or achy, even in parts of your body where you did not have surgery.  Follow these instructions at home: For at least 24 hours after the procedure:  Do not: ? Participate in activities where you could fall or become injured. ? Drive. ? Use heavy machinery. ? Drink alcohol. ? Take sleeping pills or medicines that cause drowsiness. ? Make important decisions or sign legal documents. ? Take care of children on your own.  Rest. Eating and drinking  If you vomit, drink water, juice, or soup when you can drink without vomiting.  Drink enough fluid to keep your urine clear or pale yellow.  Make sure you have little or no nausea before eating solid foods.  Follow the diet recommended by your health care provider. General instructions  Have a responsible adult stay with you until you are awake and alert.  Return to your normal activities as told by your health care provider. Ask your health care provider what activities are safe for you.  Take over-the-counter and prescription medicines only as told by your health care provider.  If you smoke, do not smoke without supervision.  Keep all follow-up visits as told by your health care provider. This is important. Contact a health care provider if:  You continue to have nausea or vomiting at home, and medicines are not helpful.  You cannot drink fluids or start eating again.  You cannot urinate after 8-12 hours.  You develop a skin rash.  You have fever.  You have increasing redness at the site of your procedure. Get help right away if:  You have difficulty breathing.  You have chest pain.  You have unexpected bleeding.  You feel  that you are having a  life-threatening or urgent problem. This information is not intended to replace advice given to you by your health care provider. Make sure you discuss any questions you have with your health care provider. Document Released: 05/24/2000 Document Revised: 07/21/2015 Document Reviewed: 01/30/2015 Elsevier Interactive Patient Education  [employer].

## 2017-03-17 ENCOUNTER — Encounter (HOSPITAL_COMMUNITY): Payer: Self-pay

## 2017-03-17 ENCOUNTER — Other Ambulatory Visit: Payer: Self-pay

## 2017-03-17 ENCOUNTER — Encounter (HOSPITAL_COMMUNITY): Payer: Self-pay | Admitting: Anesthesiology

## 2017-03-17 ENCOUNTER — Ambulatory Visit (INDEPENDENT_AMBULATORY_CARE_PROVIDER_SITE_OTHER): Payer: Medicare HMO | Admitting: Internal Medicine

## 2017-03-17 ENCOUNTER — Encounter (HOSPITAL_COMMUNITY)
Admission: RE | Admit: 2017-03-17 | Discharge: 2017-03-17 | Disposition: A | Discharge status: Home / Self Care | Payer: Medicare HMO | Source: Ambulatory Visit | Attending: Internal Medicine | Admitting: Internal Medicine

## 2017-03-17 DIAGNOSIS — Z01812 Encounter for preprocedural laboratory examination: Secondary | ICD-10-CM | POA: Diagnosis not present

## 2017-03-17 DIAGNOSIS — Z8719 Personal history of other diseases of the digestive system: Secondary | ICD-10-CM | POA: Diagnosis not present

## 2017-03-17 LAB — COMPREHENSIVE METABOLIC PANEL
ALK PHOS: 88 U/L (ref 38–126)
ALT: 15 U/L — AB (ref 17–63)
AST: 21 U/L (ref 15–41)
Albumin: 3.3 g/dL — ABNORMAL LOW (ref 3.5–5.0)
Anion gap: 9 (ref 5–15)
BUN: 24 mg/dL — ABNORMAL HIGH (ref 6–20)
CALCIUM: 9.2 mg/dL (ref 8.9–10.3)
CO2: 29 mmol/L (ref 22–32)
CREATININE: 1.59 mg/dL — AB (ref 0.61–1.24)
Chloride: 101 mmol/L (ref 101–111)
GFR calc Af Amer: 45 mL/min — ABNORMAL LOW (ref >60)
GFR, EST NON AFRICAN AMERICAN: 39 mL/min — AB (ref >60)
Glucose, Bld: 105 mg/dL — ABNORMAL HIGH (ref 65–99)
Potassium: 3.4 mmol/L — ABNORMAL LOW (ref 3.5–5.1)
Sodium: 139 mmol/L (ref 135–145)
TOTAL PROTEIN: 7 g/dL (ref 6.5–8.1)
Total Bilirubin: 0.9 mg/dL (ref 0.3–1.2)

## 2017-03-17 LAB — CBC WITH DIFFERENTIAL/PLATELET
Basophils Absolute: 0.1 10*3/uL (ref 0.0–0.1)
Basophils Relative: 1 %
EOS PCT: 3 %
Eosinophils Absolute: 0.4 10*3/uL (ref 0.0–0.7)
HCT: 42 % (ref 39.0–52.0)
HEMOGLOBIN: 13.8 g/dL (ref 13.0–17.0)
LYMPHS ABS: 2.6 10*3/uL (ref 0.7–4.0)
LYMPHS PCT: 22 %
MCH: 29.6 pg (ref 26.0–34.0)
MCHC: 32.9 g/dL (ref 30.0–36.0)
MCV: 90.1 fL (ref 78.0–100.0)
Monocytes Absolute: 0.7 10*3/uL (ref 0.1–1.0)
Monocytes Relative: 6 %
Neutro Abs: 7.8 10*3/uL — ABNORMAL HIGH (ref 1.7–7.7)
Neutrophils Relative %: 68 %
Platelets: 344 10*3/uL (ref 150–400)
RBC: 4.66 MIL/uL (ref 4.22–5.81)
RDW: 13.3 % (ref 11.5–15.5)
WBC: 11.5 10*3/uL — ABNORMAL HIGH (ref 4.0–10.5)

## 2017-03-18 NOTE — Progress Notes (Signed)
Advised patients wife that procedure was canceled for Monday, pending Cardiac Clearance with [name].  Also made her aware that Dr Laural Golden would be calling them personally regarding the matter.  Verbalized understanding.

## 2017-03-18 NOTE — Progress Notes (Signed)
Per conversation had by Dr Patsey Berthold and Dr Laural Golden, it was decided that ERCP be postponed until after cardiology review Monday 03/21/17.

## 2017-03-18 NOTE — Pre-Procedure Instructions (Signed)
Late entry :Patient was seen for PAT 03/17/2017 and was found to have an abnormal EKG.  Reviewed case history and EKG with Dr Patsey Berthold, who advised that patient was to have cardiac clearance prior to procedure, scheduled for Monday 1/21, and case would need to be rescheduled.  Dr Laural Golden made aware of above, however wanted to review case prior to cancelling.  Appointment made with Dr Arty Baumgartner for Monday 1/21 at 9:40.  Will follow up in the am and notify patient of changes when finalized.

## 2017-03-19 ENCOUNTER — Other Ambulatory Visit (INDEPENDENT_AMBULATORY_CARE_PROVIDER_SITE_OTHER): Payer: Self-pay | Admitting: Internal Medicine

## 2017-03-19 MED ORDER — POTASSIUM CHLORIDE ER 10 MEQ PO TBCR: 10.0000 meq | EXTENDED_RELEASE_TABLET | Freq: Every day | ORAL | 2 refills | Status: DC

## 2017-03-20 DIAGNOSIS — N179 Acute kidney failure, unspecified: Secondary | ICD-10-CM | POA: Diagnosis not present

## 2017-03-20 DIAGNOSIS — E039 Hypothyroidism, unspecified: Secondary | ICD-10-CM | POA: Diagnosis not present

## 2017-03-20 DIAGNOSIS — N401 Enlarged prostate with lower urinary tract symptoms: Secondary | ICD-10-CM | POA: Diagnosis not present

## 2017-03-20 DIAGNOSIS — N182 Chronic kidney disease, stage 2 (mild): Secondary | ICD-10-CM | POA: Diagnosis not present

## 2017-03-20 DIAGNOSIS — K8309 Other cholangitis: Secondary | ICD-10-CM | POA: Diagnosis not present

## 2017-03-20 DIAGNOSIS — R69 Illness, unspecified: Secondary | ICD-10-CM | POA: Diagnosis not present

## 2017-03-20 DIAGNOSIS — K851 Biliary acute pancreatitis without necrosis or infection: Secondary | ICD-10-CM | POA: Diagnosis not present

## 2017-03-20 DIAGNOSIS — I10 Essential (primary) hypertension: Secondary | ICD-10-CM | POA: Diagnosis not present

## 2017-03-21 ENCOUNTER — Ambulatory Visit (HOSPITAL_COMMUNITY): Admission: RE | Admit: 2017-03-21 | Payer: Medicare HMO | Source: Ambulatory Visit | Admitting: Internal Medicine

## 2017-03-21 ENCOUNTER — Ambulatory Visit: Payer: Medicare HMO | Admitting: Cardiovascular Disease

## 2017-03-21 ENCOUNTER — Encounter (HOSPITAL_COMMUNITY): Admission: RE | Payer: Self-pay | Source: Ambulatory Visit

## 2017-03-21 ENCOUNTER — Encounter: Payer: Self-pay | Admitting: Cardiovascular Disease

## 2017-03-21 VITALS — BP 122/78 | HR 61 | Ht 66.0 in | Wt 206.0 lb

## 2017-03-21 DIAGNOSIS — R079 Chest pain, unspecified: Secondary | ICD-10-CM | POA: Diagnosis not present

## 2017-03-21 DIAGNOSIS — I499 Cardiac arrhythmia, unspecified: Secondary | ICD-10-CM | POA: Diagnosis not present

## 2017-03-21 DIAGNOSIS — I10 Essential (primary) hypertension: Secondary | ICD-10-CM

## 2017-03-21 DIAGNOSIS — R9431 Abnormal electrocardiogram [ECG] [EKG]: Secondary | ICD-10-CM | POA: Diagnosis not present

## 2017-03-21 DIAGNOSIS — Z01818 Encounter for other preprocedural examination: Secondary | ICD-10-CM

## 2017-03-21 SURGERY — ERCP, WITH INTERVENTION IF INDICATED
Anesthesia: General

## 2017-03-21 NOTE — Progress Notes (Signed)
SUBJECTIVE: The patient presents for follow-up of atrial fibrillation.  He underwent coronary angiography on 11/27/15 which demonstrated mild nonobstructive CAD with a 20% proximal to mid LAD stenosis and a mid 20% stenosis in the left circumflex.  Left ventricular systolic function was normal.  ECG performed on 03/17/17 which I personally reviewed demonstrated sinus bradycardia with diffuse T wave inversions, LVH, nonspecific QRS widening, and left axis deviation.  He is being scheduled for ERCP with biliary stent removal as well as cholecystectomy.  He has a history of biliary pancreatitis and cholangitis.  This is my second time evaluating him.  I saw him last on 11/17/15 at which time I arrange for coronary angiography due to an abnormal stress test.  The baseline ECG at the time of that stress test reportedly demonstrated atrial fibrillation with a right bundle branch block and PVCs.  I still do not have a copy of these strips.  He had an episode of chest pain about 3 or 4 weeks ago while at home.  His wife gave him 4 baby aspirin.  She checked his blood pressure which was reportedly 200/100.  By the time EMS came he declined to go to the ED for an evaluation.  I compared the ECG performed on 03/17/17 with an ECG performed on 11/17/15.  There are marked differences with respect to new precordial T wave inversions and deep inferior T wave inversions.  He currently denies chest pain, palpitations, leg swelling, and shortness of breath.   Review of Systems: As per "subjective", otherwise negative.  No Known Allergies  Current Outpatient Medications  Medication Sig Dispense Refill  . atenolol-chlorthalidone (TENORETIC) 100-25 MG per tablet Take 1 tablet by mouth daily.     . brimonidine (ALPHAGAN) 0.2 % ophthalmic solution Place 1 drop into the left eye 2 (two) times daily.    . diphenhydrAMINE (BENADRYL) 25 MG tablet Take 50 mg by mouth at bedtime.     . finasteride (PROSCAR) 5 MG  tablet Take 5 mg by mouth every morning.     . latanoprost (XALATAN) 0.005 % ophthalmic solution Place 1 drop into both eyes at bedtime.     Marland Kitchen levothyroxine (SYNTHROID, LEVOTHROID) 25 MCG tablet Take 25 mcg by mouth daily before breakfast.     . Multiple Vitamins-Minerals (OCUVITE EXTRA PO) Take 1 tablet by mouth daily.    . potassium chloride (K-DUR) 10 MEQ tablet Take 1 tablet (10 mEq total) by mouth daily. 30 tablet 2  . Probiotic CAPS Take 1 capsule by mouth daily.     No current facility-administered medications for this visit.     Past Medical History:  Diagnosis Date  . BPH (benign prostatic hypertrophy)   . Coronary artery disease cardiologist-  [name] (previously dr Domenic Polite)   abnormal stress test 11-07-2015 ; s/p  cardiac cath 11-27-2015 , non-obstructive cad and normal LVF (pLAD to mLAD 20%; mCx 20%)  . Dementia    per wife he has dementia but has not been dx by a md  . Essential hypertension, benign   . Glaucoma, both eyes   . Hypothyroidism   . Loose bowel movements   . Lower urinary tract symptoms (LUTS)   . Mixed hyperlipidemia   . OA (osteoarthritis)   . Personal history of MI (myocardial infarction)    per stress test 11-07-2015 large fixed inferior defect consistent w/ old infarction  . PVC's (premature ventricular contractions)   . RBBB (right bundle branch block)   .  Wears glasses     Past Surgical History:  Procedure Laterality Date  . APPENDECTOMY  1948  . CARDIAC CATHETERIZATION N/A 11/27/2015   Procedure: Left Heart Cath and Coronary Angiography;  Surgeon: Burnell Blanks, MD;  Location: [facility];  Service: Cardiovascular;  Laterality: N/A;   MILD NON-OBSTRUCTIVE CAD, NORMAL LVSF, EF 55-65%  . CARDIAC CATHETERIZATION  08-23-2007   dr cooper   essentially normal coronary arteries w/ no obstructive cad, normal lvf, ef 65%  . CARDIOVASCULAR STRESS TEST  11-07-2015   [facility], [place]   Intermediate risk nuclear study w/  large fixed inferior defect noted consistent with old infarction, mild reversibility in the inferior wall concerning for ischemia/  normal LV function and wall motion , ef 57%  . CATARACT EXTRACTION W/ INTRAOCULAR LENS  IMPLANT, BILATERAL  2015  approx.  . CYSTOSCOPY WITH INSERTION OF UROLIFT N/A 01/31/2017   Procedure: CYSTOSCOPY WITH INSERTION OF UROLIFT;  Surgeon: Cleon Gustin, MD;  Location: [facility];  Service: Urology;  Laterality: N/A;  . KNEE ARTHROPLASTY Left 02/19/2016   Procedure: LEFT TOTAL KNEE ARTHROPLASTY WITH COMPUTER NAVIGATION;  Surgeon: Rod Can, MD;  Location: [facility];  Service: Orthopedics;  Laterality: Left;  . TONSILLECTOMY  child    Social History   Socioeconomic History  . Marital status: Married    Spouse name: Not on file  . Number of children: Not on file  . Years of education: Not on file  . Highest education level: Not on file  Social Needs  . Financial resource strain: Not on file  . Food insecurity - worry: Not on file  . Food insecurity - inability: Not on file  . Transportation needs - medical: Not on file  . Transportation needs - non-medical: Not on file  Occupational History  . Not on file  Tobacco Use  . Smoking status: Former Smoker    Packs/day: 1.00    Years: 30.00    Pack years: 30.00    Types: Cigarettes    Last attempt to quit: 03/01/1960    Years since quitting: 57.0  . Smokeless tobacco: Never Used  Substance and Sexual Activity  . Alcohol use: No  . Drug use: No    Comment: h/o recreational drug use in the 70's  . Sexual activity: Not on file  Other Topics Concern  . Not on file  Social History Narrative  . Not on file     Vitals:   03/21/17 0952  BP: 122/78  Pulse: 61  SpO2: 93%  Weight: 206 lb (93.4 kg)  Height: 5\' 6"  (1.676 m)    Wt Readings from Last 3 Encounters:  03/21/17 206 lb (93.4 kg)  03/15/17 202 lb 3.2 oz (91.7 kg)  03/08/17 202 lb (91.6 kg)     PHYSICAL EXAM General:  NAD HEENT: Normal. Neck: No JVD, no thyromegaly. Lungs: Clear to auscultation bilaterally with normal respiratory effort. CV: Regular rate and rhythm, normal S1/S2, no S3/S4, no murmur. No pretibial or periankle edema.  Abdomen: Soft, nontender, no distention.  Neurologic: Alert and oriented.  Psych: Normal affect. Skin: Normal. Musculoskeletal: No gross deformities.    ECG: Most recent ECG reviewed.   Labs: Lab Results  Component Value Date/Time   K 3.4 (L) 03/17/2017 11:00 AM   BUN 24 (H) 03/17/2017 11:00 AM   CREATININE 1.59 (H) 03/17/2017 11:00 AM   ALT 15 (L) 03/17/2017 11:00 AM   HGB 13.8 03/17/2017 11:00 AM  Lipids: No results found for: LDLCALC, LDLDIRECT, CHOL, TRIG, HDL     ASSESSMENT AND PLAN: 1.  Atrial fibrillation: Symptomatically stable with no reported history of arrhythmias since his last visit with me in September 2017.  I will again try and obtain ECG strips from stress test for personal review before deciding on medical management.  2.  Hypertension: Controlled.  No changes to therapy.  3.  Preoperative risk stratification with recent episode of chest pain and abnormal ECG: He has mild nonobstructive coronary artery disease.  He is presently symptomatically stable.  I will obtain an echocardiogram to evaluate cardiac structure and function.  Left ventricular systolic function appeared grossly normal by left ventriculography in September 2017.  At this time, I do not feel he has any prohibitive medical issues which would preclude biliary stent retrieval/ERCP.    Disposition: Follow up to be determined   Kate Sable, M.D., F.A.C.C.

## 2017-03-21 NOTE — Patient Instructions (Signed)
Medication Instructions:  Your physician recommends that you continue on your current medications as directed. Please refer to the Current Medication list given to you today.   Labwork: NONE  Testing/Procedures: Your physician has requested that you have an echocardiogram. Echocardiography is a painless test that uses sound waves to create images of your heart. It provides your doctor with information about the size and shape of your heart and how well your heart's chambers and valves are working. This procedure takes approximately one hour. There are no restrictions for this procedure.    Follow-Up: Your physician recommends that you schedule a follow-up appointment in: TO BE DETERMINED BASED ON TEST    Any Other Special Instructions Will Be Listed Below (If Applicable).     If you need a refill on your cardiac medications before your next appointment, please call your pharmacy.

## 2017-03-23 ENCOUNTER — Ambulatory Visit (HOSPITAL_COMMUNITY)
Admission: RE | Admit: 2017-03-23 | Discharge: 2017-03-23 | Disposition: A | Discharge status: Home / Self Care | Payer: Medicare HMO | Source: Ambulatory Visit | Attending: Cardiovascular Disease | Admitting: Cardiovascular Disease

## 2017-03-23 DIAGNOSIS — R9431 Abnormal electrocardiogram [ECG] [EKG]: Secondary | ICD-10-CM | POA: Diagnosis not present

## 2017-03-23 DIAGNOSIS — R0600 Dyspnea, unspecified: Secondary | ICD-10-CM | POA: Diagnosis not present

## 2017-03-23 DIAGNOSIS — I10 Essential (primary) hypertension: Secondary | ICD-10-CM | POA: Insufficient documentation

## 2017-03-23 DIAGNOSIS — R072 Precordial pain: Secondary | ICD-10-CM | POA: Diagnosis not present

## 2017-03-23 LAB — ECHOCARDIOGRAM COMPLETE
CHL CUP STROKE VOLUME: 65 mL
E decel time: 387 msec
E/e' ratio: 6.19
FS: 46 % — AB (ref 28–44)
IVS/LV PW RATIO, ED: 0.98
LA ID, A-P, ES: 35 mm
LA diam index: 1.65 cm/m2
LA vol index: 21.1 mL/m2
LA vol: 44.7 mL
LAVOLA4C: 32.4 mL
LDCA: 3.8 cm2
LEFT ATRIUM END SYS DIAM: 35 mm
LV E/e'average: 6.19
LV PW d: 12.6 mm — AB (ref 0.6–1.1)
LV TDI E'LATERAL: 8.92
LV TDI E'MEDIAL: 6.42
LV dias vol: 92 mL (ref 62–150)
LV e' LATERAL: 8.92 cm/s
LV sys vol index: 13 mL/m2
LV sys vol: 28 mL
LVDIAVOLIN: 43 mL/m2
LVEEMED: 6.19
LVOT SV: 94 mL
LVOT VTI: 24.8 cm
LVOT peak grad rest: 6 mmHg
LVOTD: 22 mm
LVOTPV: 118 cm/s
Lateral S' vel: 10.4 cm/s
MV Dec: 387
MV pk A vel: 80.2 m/s
MVPKEVEL: 55.2 m/s
RV TAPSE: 21.3 mm
Simpson's disk: 70

## 2017-03-23 NOTE — Progress Notes (Signed)
*  PRELIMINARY RESULTS* Echocardiogram 2D Echocardiogram has been performed.  Kurt French 03/23/2017, 11:10 AM

## 2017-03-28 DIAGNOSIS — C4402 Squamous cell carcinoma of skin of lip: Secondary | ICD-10-CM | POA: Diagnosis not present

## 2017-03-30 ENCOUNTER — Telehealth (INDEPENDENT_AMBULATORY_CARE_PROVIDER_SITE_OTHER): Payer: Self-pay | Admitting: *Deleted

## 2017-03-30 NOTE — Telephone Encounter (Signed)
Kurt French - this may be for you to review.

## 2017-03-30 NOTE — Telephone Encounter (Signed)
Kurt French called for patient stating he has done seen [name] and she wants to know if patient can schedule his procedure because he had to see the heart doctor for clearance. Please advise

## 2017-03-30 NOTE — Telephone Encounter (Signed)
Lelon Frohlich- this may be for you to review.

## 2017-03-31 NOTE — Telephone Encounter (Signed)
Dr. Bronson Ing note appreciated. Echo results also noted. Will proceed with ERCP.

## 2017-04-01 ENCOUNTER — Other Ambulatory Visit (INDEPENDENT_AMBULATORY_CARE_PROVIDER_SITE_OTHER): Payer: Self-pay | Admitting: *Deleted

## 2017-04-01 ENCOUNTER — Encounter (HOSPITAL_COMMUNITY)
Admission: RE | Admit: 2017-04-01 | Discharge: 2017-04-01 | Disposition: A | Discharge status: Home / Self Care | Payer: Medicare HMO | Source: Ambulatory Visit | Attending: Internal Medicine | Admitting: Internal Medicine

## 2017-04-01 DIAGNOSIS — K819 Cholecystitis, unspecified: Secondary | ICD-10-CM | POA: Insufficient documentation

## 2017-04-01 NOTE — Telephone Encounter (Signed)
ERCP sch'd 04/04/17 at 1 (11), patient aware

## 2017-04-04 ENCOUNTER — Ambulatory Visit (HOSPITAL_COMMUNITY): Payer: Medicare HMO | Admitting: Anesthesiology

## 2017-04-04 ENCOUNTER — Ambulatory Visit (HOSPITAL_COMMUNITY)
Admission: RE | Admit: 2017-04-04 | Discharge: 2017-04-04 | Disposition: A | Discharge status: Home / Self Care | Payer: Medicare HMO | Source: Ambulatory Visit | Attending: Internal Medicine | Admitting: Internal Medicine

## 2017-04-04 ENCOUNTER — Ambulatory Visit (HOSPITAL_COMMUNITY): Payer: Medicare HMO

## 2017-04-04 ENCOUNTER — Encounter (HOSPITAL_COMMUNITY)
Admission: RE | Disposition: A | Discharge status: Home / Self Care | Payer: Self-pay | Source: Ambulatory Visit | Attending: Internal Medicine

## 2017-04-04 ENCOUNTER — Encounter (HOSPITAL_COMMUNITY): Payer: Self-pay | Admitting: *Deleted

## 2017-04-04 DIAGNOSIS — I252 Old myocardial infarction: Secondary | ICD-10-CM | POA: Diagnosis not present

## 2017-04-04 DIAGNOSIS — Z4689 Encounter for fitting and adjustment of other specified devices: Secondary | ICD-10-CM

## 2017-04-04 DIAGNOSIS — K839 Disease of biliary tract, unspecified: Secondary | ICD-10-CM | POA: Insufficient documentation

## 2017-04-04 DIAGNOSIS — F0390 Unspecified dementia without behavioral disturbance: Secondary | ICD-10-CM | POA: Diagnosis not present

## 2017-04-04 DIAGNOSIS — Z87891 Personal history of nicotine dependence: Secondary | ICD-10-CM | POA: Diagnosis not present

## 2017-04-04 DIAGNOSIS — K819 Cholecystitis, unspecified: Secondary | ICD-10-CM | POA: Diagnosis not present

## 2017-04-04 DIAGNOSIS — I509 Heart failure, unspecified: Secondary | ICD-10-CM | POA: Diagnosis not present

## 2017-04-04 DIAGNOSIS — I110 Hypertensive heart disease with heart failure: Secondary | ICD-10-CM | POA: Insufficient documentation

## 2017-04-04 DIAGNOSIS — Z79899 Other long term (current) drug therapy: Secondary | ICD-10-CM | POA: Insufficient documentation

## 2017-04-04 DIAGNOSIS — I2510 Atherosclerotic heart disease of native coronary artery without angina pectoris: Secondary | ICD-10-CM | POA: Diagnosis not present

## 2017-04-04 DIAGNOSIS — K859 Acute pancreatitis without necrosis or infection, unspecified: Secondary | ICD-10-CM | POA: Diagnosis not present

## 2017-04-04 DIAGNOSIS — Z8719 Personal history of other diseases of the digestive system: Secondary | ICD-10-CM | POA: Diagnosis not present

## 2017-04-04 DIAGNOSIS — Z4659 Encounter for fitting and adjustment of other gastrointestinal appliance and device: Secondary | ICD-10-CM | POA: Diagnosis not present

## 2017-04-04 DIAGNOSIS — R69 Illness, unspecified: Secondary | ICD-10-CM | POA: Diagnosis not present

## 2017-04-04 HISTORY — PX: ERCP: SHX5425

## 2017-04-04 HISTORY — PX: STENT REMOVAL: SHX6421

## 2017-04-04 SURGERY — ERCP, WITH INTERVENTION IF INDICATED
Anesthesia: General

## 2017-04-04 MED ORDER — SUGAMMADEX SODIUM 200 MG/2ML IV SOLN
INTRAVENOUS | Status: AC
  Filled 2017-04-04: qty 2

## 2017-04-04 MED ORDER — LIDOCAINE HCL 1 % IJ SOLN
INTRAMUSCULAR | Status: DC | PRN
  Administered 2017-04-04: 25 mg via INTRADERMAL

## 2017-04-04 MED ORDER — IOPAMIDOL (ISOVUE-M 300) INJECTION 61%
INTRAMUSCULAR | Status: DC | PRN
  Administered 2017-04-04: 30 mL

## 2017-04-04 MED ORDER — STERILE WATER FOR IRRIGATION IR SOLN
Status: DC | PRN
  Administered 2017-04-04: 1000 mL

## 2017-04-04 MED ORDER — MIDAZOLAM HCL 2 MG/2ML IJ SOLN
1.0000 mg | INTRAMUSCULAR | Status: AC
  Administered 2017-04-04 (×2): 1 mg via INTRAVENOUS

## 2017-04-04 MED ORDER — SUGAMMADEX SODIUM 200 MG/2ML IV SOLN
INTRAVENOUS | Status: DC | PRN
  Administered 2017-04-04: 186.8 mg via INTRAVENOUS

## 2017-04-04 MED ORDER — MIDAZOLAM HCL 5 MG/5ML IJ SOLN
INTRAMUSCULAR | Status: DC | PRN
  Administered 2017-04-04: 1 mg via INTRAVENOUS

## 2017-04-04 MED ORDER — PROPOFOL 10 MG/ML IV BOLUS
INTRAVENOUS | Status: DC | PRN
  Administered 2017-04-04: 80 mg via INTRAVENOUS

## 2017-04-04 MED ORDER — FENTANYL CITRATE (PF) 250 MCG/5ML IJ SOLN
INTRAMUSCULAR | Status: AC
  Filled 2017-04-04: qty 5

## 2017-04-04 MED ORDER — MIDAZOLAM HCL 2 MG/2ML IJ SOLN
INTRAMUSCULAR | Status: AC
  Filled 2017-04-04: qty 2

## 2017-04-04 MED ORDER — IOPAMIDOL (ISOVUE-300) INJECTION 61%
INTRAVENOUS | Status: AC
  Filled 2017-04-04: qty 100

## 2017-04-04 MED ORDER — SUCCINYLCHOLINE CHLORIDE 20 MG/ML IJ SOLN
INTRAMUSCULAR | Status: AC
  Filled 2017-04-04: qty 1

## 2017-04-04 MED ORDER — LACTATED RINGERS IV SOLN
INTRAVENOUS | Status: DC
  Administered 2017-04-04: 11:00:00 via INTRAVENOUS

## 2017-04-04 MED ORDER — FENTANYL CITRATE (PF) 100 MCG/2ML IJ SOLN
INTRAMUSCULAR | Status: DC | PRN
  Administered 2017-04-04: 25 ug via INTRAVENOUS

## 2017-04-04 MED ORDER — LIDOCAINE HCL (PF) 1 % IJ SOLN
INTRAMUSCULAR | Status: AC
  Filled 2017-04-04: qty 5

## 2017-04-04 MED ORDER — EPHEDRINE SULFATE 50 MG/ML IJ SOLN
INTRAMUSCULAR | Status: DC | PRN
  Administered 2017-04-04: 10 mg via INTRAVENOUS

## 2017-04-04 MED ORDER — SUGAMMADEX SODIUM 500 MG/5ML IV SOLN: INTRAVENOUS | Status: DC | PRN

## 2017-04-04 MED ORDER — CEFAZOLIN SODIUM-DEXTROSE 2-4 GM/100ML-% IV SOLN
2.0000 g | INTRAVENOUS | Status: AC
  Administered 2017-04-04: 2 g via INTRAVENOUS
  Filled 2017-04-04: qty 100

## 2017-04-04 MED ORDER — ROCURONIUM BROMIDE 100 MG/10ML IV SOLN
INTRAVENOUS | Status: DC | PRN
  Administered 2017-04-04: 25 mg via INTRAVENOUS
  Administered 2017-04-04: 5 mg via INTRAVENOUS

## 2017-04-04 MED ORDER — GLUCAGON HCL RDNA (DIAGNOSTIC) 1 MG IJ SOLR
INTRAMUSCULAR | Status: AC
  Filled 2017-04-04: qty 2

## 2017-04-04 MED ORDER — PROPOFOL 10 MG/ML IV BOLUS
INTRAVENOUS | Status: AC
  Filled 2017-04-04: qty 20

## 2017-04-04 NOTE — H&P (Signed)
Kurt Roys Friddle Sr. is an 84 y.o. male.   Chief Complaint: Patient is here for ERCP and stent removal. HPI: Patient is 84 year old Caucasian male who developed biliary pancreatitis and jaundice about 6 weeks ago.  He was initially treated at [facility] and subsequently transferred to [facility] where he had ERCP on 02/14/2017.  He had common duct stone.  Stone was removed following biliary sphincterotomy.  Since he had significant edema to ampullary region biliary stent was placed for decompression.  Patient was also advised cholecystectomy.  Patient was to return to [facility] but he decided not to go.  He was sent to our office by Dr. Florene Route.  Patient was seen on 03/15/2017.  Preop EKG was abnormal.  Therefore procedure was postponed until cardiology clearance which he has gone through. 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  This was done on 03/29/2017. Patient has noted mild intermittent pain in right lower quadrant.  His wife states one occasion he took an aspirin he has not experience fever chills nausea or vomiting.  His appetite has been good.  Past Medical History:  Diagnosis Date  . BPH (benign prostatic hypertrophy)   . Coronary artery disease cardiologist-  [name] (previously dr Domenic Polite)   abnormal stress test 11-07-2015 ; s/p  cardiac cath 11-27-2015 , non-obstructive cad and normal LVF (pLAD to mLAD 20%; mCx 20%)  . Dementia    per wife he has dementia but has not been dx by a md  . Essential hypertension, benign   . Glaucoma, both eyes   . Hypothyroidism   . Loose bowel movements   . Lower urinary tract symptoms (LUTS)   . Mixed hyperlipidemia   . OA (osteoarthritis)   . Personal history of MI (myocardial infarction)    per stress test 11-07-2015 large fixed inferior defect consistent w/ old infarction  . PVC's (premature ventricular contractions)   . RBBB (right bundle branch block)   . Wears glasses     Past  Surgical History:  Procedure Laterality Date  . APPENDECTOMY  1948  . CARDIAC CATHETERIZATION N/A 11/27/2015   Procedure: Left Heart Cath and Coronary Angiography;  Surgeon: Burnell Blanks, MD;  Location: [facility];  Service: Cardiovascular;  Laterality: N/A;   MILD NON-OBSTRUCTIVE CAD, NORMAL LVSF, EF 55-65%  . CARDIAC CATHETERIZATION  08-23-2007   dr cooper   essentially normal coronary arteries w/ no obstructive cad, normal lvf, ef 65%  . CARDIOVASCULAR STRESS TEST  11-07-2015   [facility], [place]   Intermediate risk nuclear study w/ large fixed inferior defect noted consistent with old infarction, mild reversibility in the inferior wall concerning for ischemia/  normal LV function and wall motion , ef 57%  . CATARACT EXTRACTION W/ INTRAOCULAR LENS  IMPLANT, BILATERAL  2015  approx.  . CYSTOSCOPY WITH INSERTION OF UROLIFT N/A 01/31/2017   Procedure: CYSTOSCOPY WITH INSERTION OF UROLIFT;  Surgeon: Cleon Gustin, MD;  Location: [facility];  Service: Urology;  Laterality: N/A;  . KNEE ARTHROPLASTY Left 02/19/2016   Procedure: LEFT TOTAL KNEE ARTHROPLASTY WITH COMPUTER NAVIGATION;  Surgeon: Rod Can, MD;  Location: [facility];  Service: Orthopedics;  Laterality: Left;  . TONSILLECTOMY  child    Family History  Problem Relation Age of Onset  . Colon cancer Mother   . Other Father        MVA   Social History:  reports  that he quit smoking about 57 years ago. His smoking use included cigarettes. He has a 30.00 pack-year smoking history. he has never used smokeless tobacco. He reports that he does not drink alcohol or use drugs.  Allergies: No Known Allergies  Medications Prior to Admission  Medication Sig Dispense Refill  . atenolol-chlorthalidone (TENORETIC) 100-25 MG per tablet Take 1 tablet by mouth daily.     . brimonidine (ALPHAGAN) 0.2 % ophthalmic solution Place 1 drop into the left eye 2 (two) times daily.    . diphenhydrAMINE  (BENADRYL) 25 MG tablet Take 50 mg by mouth at bedtime.     . finasteride (PROSCAR) 5 MG tablet Take 5 mg by mouth every morning.     . latanoprost (XALATAN) 0.005 % ophthalmic solution Place 1 drop into both eyes at bedtime.     Marland Kitchen levothyroxine (SYNTHROID, LEVOTHROID) 25 MCG tablet Take 25 mcg by mouth daily before breakfast.     . Multiple Vitamins-Minerals (OCUVITE EXTRA PO) Take 1 tablet by mouth daily.    . potassium chloride (K-DUR) 10 MEQ tablet Take 1 tablet (10 mEq total) by mouth daily. 30 tablet 2  . Probiotic CAPS Take 1 capsule by mouth daily.      No results found for this or any previous visit (from the past 48 hour(s)). No results found.  ROS  Blood pressure (!) 152/72, pulse (!) 51, temperature (!) 97.4 F (36.3 C), temperature source Oral, resp. rate 16, SpO2 98 %. Physical Exam  Constitutional: He appears well-developed and well-nourished.  HENT:  Mouth/Throat: Oropharynx is clear and moist.  Patient has about 7 mm ulcer at the lower lip site of excision of tumor last week.  Eyes: Conjunctivae are normal. No scleral icterus.  Neck: No thyromegaly present.  Cardiovascular: Normal rate, regular rhythm and normal heart sounds.  No murmur heard. Respiratory: Effort normal and breath sounds normal.  GI:  Abdomen is full but soft and nontender without organomegaly or masses.  Musculoskeletal: He exhibits no edema.  Lymphadenopathy:    He has no cervical adenopathy.  Neurological: He is alert.  Skin: Skin is warm and dry.     Assessment/Plan History of choledocholithiasis/cholangitis treated with sphincterotomy stone extraction and biliary stenting. ERCP with stent removal.  Hildred Laser, MD 04/04/2017, 11:47 AM

## 2017-04-04 NOTE — Anesthesia Procedure Notes (Signed)
Procedure Name: Intubation Date/Time: 04/04/2017 12:12 PM Performed by: Charmaine Downs, CRNA Pre-anesthesia Checklist: Patient identified, Patient being monitored, Timeout performed, Emergency Drugs available and Suction available Patient Re-evaluated:Patient Re-evaluated prior to induction Oxygen Delivery Method: Circle System Utilized Preoxygenation: Pre-oxygenation with 100% oxygen Induction Type: IV induction Ventilation: Mask ventilation without difficulty and Oral airway inserted - appropriate to patient size Laryngoscope Size: Mac and 4 Grade View: Grade II Tube type: Oral Tube size: 8.0 mm Number of attempts: 1 Airway Equipment and Method: stylet and Stylet Placement Confirmation: ETT inserted through vocal cords under direct vision,  positive ETCO2 and breath sounds checked- equal and bilateral Secured at: 22 cm Tube secured with: Tape Dental Injury: Teeth and Oropharynx as per pre-operative assessment

## 2017-04-04 NOTE — Discharge Instructions (Signed)
PATIENT INSTRUCTIONS POST-ANESTHESIA  IMMEDIATELY FOLLOWING SURGERY:  Do not drive or operate machinery for the first twenty four hours after surgery.  Do not make any important decisions for twenty four hours after surgery or while taking narcotic pain medications or sedatives.  If you develop intractable nausea and vomiting or a severe headache please notify your doctor immediately.  FOLLOW-UP:  Please make an appointment with your surgeon as instructed. You do not need to follow up with anesthesia unless specifically instructed to do so.  WOUND CARE INSTRUCTIONS (if applicable):  Keep a dry clean dressing on the anesthesia/puncture wound site if there is drainage.  Once the wound has quit draining you may leave it open to air.  Generally you should leave the bandage intact for twenty four hours unless there is drainage.  If the epidural site drains for more than 36-48 hours please call the anesthesia department.  QUESTIONS?:  Please feel free to call your physician or the hospital operator if you have any questions, and they will be happy to assist you.        Resume usual medications as before. Clear liquids today.  Resume usual diet in a.m. No driving for 24 hours. Please call office with progress report in 4 weeks. Call earlier if he has epigastric or right upper quadrant abdominal pain.

## 2017-04-04 NOTE — Anesthesia Preprocedure Evaluation (Signed)
Anesthesia Evaluation  Patient identified by MRN, date of birth, ID band Patient awake    Reviewed: Allergy & Precautions, NPO status , Patient's Chart, lab work & pertinent test results  Airway Mallampati: I  TM Distance: >3 FB Neck ROM: Full    Dental no notable dental hx. (+) Teeth Intact, Partial Upper   Pulmonary neg pulmonary ROS, shortness of breath and with exertion, former smoker,    Pulmonary exam normal breath sounds clear to auscultation       Cardiovascular hypertension, + CAD, + Past MI, +CHF and + DOE  Normal cardiovascular exam Rhythm:Regular Rate:Normal  Echo: - Left ventricle: The cavity size was normal. Wall thickness was increased in a pattern of mild LVH. There was moderate focal basal septal hypertrophy. Systolic function was normal. The estimated ejection fraction was in the range of 55% to 60%. Wall motion was normal; there were no regional wall motion abnormalities. Doppler parameters are consistent with abnormal left ventricular relaxation (grade 1 diastolic dysfunction). - Aortic valve: Mildly thickened leaflets. There was trivial regurgitation.     Neuro/Psych Dementia  negative neurological ROS  negative psych ROS   GI/Hepatic negative GI ROS,   Endo/Other  negative endocrine ROSHypothyroidism   Renal/GU negative Renal ROS  negative genitourinary   Musculoskeletal negative musculoskeletal ROS (+)   Abdominal   Peds negative pediatric ROS (+)  Hematology negative hematology ROS (+)   Anesthesia Other Findings   Reproductive/Obstetrics negative OB ROS                             Anesthesia Physical Anesthesia Plan  ASA: III  Anesthesia Plan: General   Post-op Pain Management:    Induction: Intravenous  PONV Risk Score and Plan:   Airway Management Planned: Oral ETT  Additional Equipment:   Intra-op Plan:   Post-operative Plan: Extubation  in OR  Informed Consent: I have reviewed the patients History and Physical, chart, labs and discussed the procedure including the risks, benefits and alternatives for the proposed anesthesia with the patient or authorized representative who has indicated his/her understanding and acceptance.     Plan Discussed with:   Anesthesia Plan Comments:         Anesthesia Quick Evaluation

## 2017-04-04 NOTE — Transfer of Care (Signed)
Immediate Anesthesia Transfer of Care Note  Patient: Kurt Safi Matton Sr.  Procedure(s) Performed: ENDOSCOPIC RETROGRADE CHOLANGIOPANCREATOGRAPHY (ERCP) (N/A ) BILIARY STENT REMOVAL  Patient Location: PACU  Anesthesia Type:General  Level of Consciousness: awake and patient cooperative  Airway & Oxygen Therapy: Patient Spontanous Breathing and Patient connected to face mask oxygen  Post-op Assessment: Report given to RN and Post -op Vital signs reviewed and stable  Post vital signs: Reviewed and stable  Last Vitals:  Vitals:   04/04/17 1150 04/04/17 1155  BP: 139/80 (!) 149/78  Pulse:    Resp: 19 18  Temp:    SpO2: 99% 97%    Last Pain:  Vitals:   04/04/17 1112  TempSrc: Oral      Patients Stated Pain Goal: 5 (33/35/45 6256)  Complications: No apparent anesthesia complications

## 2017-04-04 NOTE — Op Note (Signed)
[facility] Patient Name: Kurt French Procedure Date: 04/04/2017 11:27 AM MRN: [id number] Date of Birth: Feb 15, 1933 Attending MD: Hildred Laser , MD CSN: [id number] Age: 84 Admit Type: Outpatient Procedure:                ERCP Indications:              Biliary stent removal Providers:                Hildred Laser, MD, Janeece Riggers, RN, Randa Spike,                            Technician Referring MD:             Curlene Labrum, MD and Aviva Signs, MD Medicines:                General Anesthesia Complications:            No immediate complications. Estimated Blood Loss:     Estimated blood loss: none. Procedure:                Pre-Anesthesia Assessment:                           - Prior to the procedure, a History and Physical                            was performed, and patient medications and                            allergies were reviewed. The patient's tolerance of                            previous anesthesia was also reviewed. The risks                            and benefits of the procedure and the sedation                            options and risks were discussed with the patient.                            All questions were answered, and informed consent                            was obtained. Prior Anticoagulants: The patient has                            taken no previous anticoagulant or antiplatelet                            agents. ASA Grade Assessment: III - A patient with                            severe systemic disease. After reviewing the risks  and benefits, the patient was deemed in                            satisfactory condition to undergo the procedure.                           After obtaining informed consent, the scope was                            passed under direct vision. Throughout the                            procedure, the patient's blood pressure, pulse, and                            oxygen  saturations were monitored continuously. The                            [id number] ([id number]) scope was introduced through                            the mouth, and used to inject contrast into and                            used to inject contrast into the bile duct. The                            ERCP was accomplished without difficulty. The                            patient tolerated the procedure well. Scope In: 12:28:32 PM Scope Out: 12:53:10 PM Total Procedure Duration: 0 hours 24 minutes 38 seconds  Findings:      A biliary stent was visible on the scout film. The scope was advanced to       a normal Kurt papilla in the descending duodenum. Examination of the       pharynx, larynx and associated structures, and upper GI tract was       normal. One stent was removed from the biliary tree using a snare. The       stent was found to be partially occluded via the water column test.       Prior sphincterotomy was felt be adaquate. The bile duct was deeply       cannulated with the Autotome sphincterotome. Contrast was injected. I       personally interpreted the bile duct images. There was brisk flow of       contrast through the ducts. Image quality was excellent. Contrast       extended to the entire biliary tree.CHD and CBD were of normal caliber.       The common bile duct contained filling defect in distal region thought       to be sludge. The biliary tree was swept with a 9 mm balloon and basket       starting at the upper third of the main bile duct. Sludge was swept from       the duct.  Debris was swept from the duct. Impression:               - The examination was suspicious for sludge.                           - One stent was removed from the biliary tree.                           - The biliary tree was swept and sludge and debris                            were found. Moderate Sedation:      Per Anesthesia Care Recommendation:           - Discharge patient to home (with  spouse).                           - Clear liquid diet today.                           - Resume previous diet tomorrow.                           - Telephone GI clinic in 4 weeks. Procedure Code(s):        --- Professional ---                           [phone], Endoscopic retrograde                            cholangiopancreatography (ERCP); with removal of                            foreign body(s) or stent(s) from biliary/pancreatic                            duct(s)                           [id number], Endoscopic retrograde                            cholangiopancreatography (ERCP); with removal of                            calculi/debris from biliary/pancreatic duct(s) Diagnosis Code(s):        --- Professional ---                           Q73.41, Encounter for fitting and adjustment of                            other gastrointestinal appliance and device CPT copyright 2016 American Medical Association. All rights reserved. The codes documented in this report are preliminary and upon coder review may  be revised to meet current compliance requirements. Hildred Laser, MD Hildred Laser, MD 04/04/2017 1:23:54 PM This report has been signed electronically.  Number of Addenda: 0 

## 2017-04-04 NOTE — Progress Notes (Signed)
Noted pink abrasion to left lower lip; Patients states he had this before he came to the hospital.  Lip moisturizer given at wife's request.

## 2017-04-04 NOTE — Anesthesia Postprocedure Evaluation (Signed)
Anesthesia Post Note  Patient: Kurt Saville Greenley Sr.  Procedure(s) Performed: ENDOSCOPIC RETROGRADE CHOLANGIOPANCREATOGRAPHY (ERCP) (N/A ) BILIARY STENT REMOVAL  Patient location during evaluation: PACU Anesthesia Type: General Level of consciousness: awake and patient cooperative Pain management: pain level controlled Vital Signs Assessment: post-procedure vital signs reviewed and stable Respiratory status: spontaneous breathing, nonlabored ventilation and respiratory function stable Cardiovascular status: blood pressure returned to baseline Postop Assessment: no apparent nausea or vomiting Anesthetic complications: no     Last Vitals:  Vitals:   04/04/17 1400 04/04/17 1406  BP: (!) 148/74 (!) 152/75  Pulse: (!) 51 (!) 50  Resp: (!) 24   Temp:  36.4 C  SpO2: 95% 96%    Last Pain:  Vitals:   04/04/17 1406  TempSrc: Oral  PainSc: 0-No pain                 Saray Capasso J

## 2017-04-05 NOTE — Addendum Note (Signed)
Addendum  created 04/05/17 0708 by Mickel Baas, CRNA   Intraprocedure Staff edited

## 2017-04-07 ENCOUNTER — Encounter (HOSPITAL_COMMUNITY): Payer: Self-pay | Admitting: Internal Medicine

## 2017-04-08 ENCOUNTER — Encounter (INDEPENDENT_AMBULATORY_CARE_PROVIDER_SITE_OTHER): Payer: Self-pay | Admitting: Internal Medicine

## 2017-04-13 ENCOUNTER — Ambulatory Visit: Payer: Medicare HMO | Admitting: Urology

## 2017-04-13 DIAGNOSIS — N401 Enlarged prostate with lower urinary tract symptoms: Secondary | ICD-10-CM | POA: Diagnosis not present

## 2017-04-13 DIAGNOSIS — R3915 Urgency of urination: Secondary | ICD-10-CM | POA: Diagnosis not present

## 2017-04-14 ENCOUNTER — Other Ambulatory Visit: Payer: Self-pay | Admitting: Urology

## 2017-04-26 ENCOUNTER — Encounter (HOSPITAL_COMMUNITY): Payer: Self-pay

## 2017-04-27 ENCOUNTER — Encounter (HOSPITAL_COMMUNITY)
Admission: RE | Admit: 2017-04-27 | Discharge: 2017-04-27 | Disposition: A | Discharge status: Home / Self Care | Payer: Medicare HMO | Source: Ambulatory Visit | Attending: Urology | Admitting: Urology

## 2017-05-02 ENCOUNTER — Ambulatory Visit (HOSPITAL_COMMUNITY): Payer: Medicare HMO | Admitting: Anesthesiology

## 2017-05-02 ENCOUNTER — Encounter (HOSPITAL_COMMUNITY): Payer: Self-pay

## 2017-05-02 ENCOUNTER — Ambulatory Visit (HOSPITAL_COMMUNITY)
Admission: RE | Admit: 2017-05-02 | Discharge: 2017-05-02 | Disposition: A | Discharge status: Home / Self Care | Payer: Medicare HMO | Source: Ambulatory Visit | Attending: Urology | Admitting: Urology

## 2017-05-02 ENCOUNTER — Other Ambulatory Visit: Payer: Self-pay

## 2017-05-02 ENCOUNTER — Encounter (HOSPITAL_COMMUNITY)
Admission: RE | Disposition: A | Discharge status: Home / Self Care | Payer: Self-pay | Source: Ambulatory Visit | Attending: Urology

## 2017-05-02 DIAGNOSIS — N138 Other obstructive and reflux uropathy: Secondary | ICD-10-CM | POA: Insufficient documentation

## 2017-05-02 DIAGNOSIS — Z96652 Presence of left artificial knee joint: Secondary | ICD-10-CM | POA: Diagnosis not present

## 2017-05-02 DIAGNOSIS — D494 Neoplasm of unspecified behavior of bladder: Secondary | ICD-10-CM | POA: Diagnosis not present

## 2017-05-02 DIAGNOSIS — I119 Hypertensive heart disease without heart failure: Secondary | ICD-10-CM | POA: Insufficient documentation

## 2017-05-02 DIAGNOSIS — I252 Old myocardial infarction: Secondary | ICD-10-CM | POA: Insufficient documentation

## 2017-05-02 DIAGNOSIS — F0390 Unspecified dementia without behavioral disturbance: Secondary | ICD-10-CM | POA: Diagnosis not present

## 2017-05-02 DIAGNOSIS — I2510 Atherosclerotic heart disease of native coronary artery without angina pectoris: Secondary | ICD-10-CM | POA: Insufficient documentation

## 2017-05-02 DIAGNOSIS — R69 Illness, unspecified: Secondary | ICD-10-CM | POA: Diagnosis not present

## 2017-05-02 DIAGNOSIS — Z87891 Personal history of nicotine dependence: Secondary | ICD-10-CM | POA: Insufficient documentation

## 2017-05-02 DIAGNOSIS — N401 Enlarged prostate with lower urinary tract symptoms: Secondary | ICD-10-CM | POA: Insufficient documentation

## 2017-05-02 DIAGNOSIS — N329 Bladder disorder, unspecified: Secondary | ICD-10-CM | POA: Diagnosis not present

## 2017-05-02 DIAGNOSIS — R3914 Feeling of incomplete bladder emptying: Secondary | ICD-10-CM | POA: Insufficient documentation

## 2017-05-02 DIAGNOSIS — R339 Retention of urine, unspecified: Secondary | ICD-10-CM | POA: Diagnosis present

## 2017-05-02 DIAGNOSIS — N400 Enlarged prostate without lower urinary tract symptoms: Secondary | ICD-10-CM | POA: Diagnosis not present

## 2017-05-02 DIAGNOSIS — N3090 Cystitis, unspecified without hematuria: Secondary | ICD-10-CM | POA: Diagnosis not present

## 2017-05-02 HISTORY — PX: CYSTOSCOPY WITH INSERTION OF UROLIFT: SHX6678

## 2017-05-02 HISTORY — PX: CYSTOSCOPY WITH BIOPSY: SHX5122

## 2017-05-02 SURGERY — CYSTOSCOPY WITH INSERTION OF UROLIFT
Anesthesia: General | Site: Prostate

## 2017-05-02 MED ORDER — LACTATED RINGERS IV SOLN
INTRAVENOUS | Status: DC
  Administered 2017-05-02: 08:00:00 via INTRAVENOUS

## 2017-05-02 MED ORDER — CEFAZOLIN SODIUM-DEXTROSE 2-4 GM/100ML-% IV SOLN
INTRAVENOUS | Status: AC
  Filled 2017-05-02: qty 100

## 2017-05-02 MED ORDER — LIDOCAINE HCL (PF) 1 % IJ SOLN
INTRAMUSCULAR | Status: AC
  Filled 2017-05-02: qty 5

## 2017-05-02 MED ORDER — MIDAZOLAM HCL 2 MG/2ML IJ SOLN
1.0000 mg | INTRAMUSCULAR | Status: AC
  Administered 2017-05-02 (×2): 1 mg via INTRAVENOUS

## 2017-05-02 MED ORDER — FENTANYL CITRATE (PF) 100 MCG/2ML IJ SOLN
INTRAMUSCULAR | Status: DC | PRN
  Administered 2017-05-02: 25 ug via INTRAVENOUS

## 2017-05-02 MED ORDER — CEFAZOLIN SODIUM-DEXTROSE 2-4 GM/100ML-% IV SOLN
2.0000 g | INTRAVENOUS | Status: AC
  Administered 2017-05-02: 2 g via INTRAVENOUS

## 2017-05-02 MED ORDER — PROPOFOL 10 MG/ML IV BOLUS
INTRAVENOUS | Status: DC | PRN
  Administered 2017-05-02: 100 mg via INTRAVENOUS

## 2017-05-02 MED ORDER — LIDOCAINE HCL 1 % IJ SOLN
INTRAMUSCULAR | Status: DC | PRN
  Administered 2017-05-02: 25 mg via INTRADERMAL

## 2017-05-02 MED ORDER — STERILE WATER FOR IRRIGATION IR SOLN
Status: DC | PRN
  Administered 2017-05-02: 1000 mL
  Administered 2017-05-02: 3000 mL

## 2017-05-02 MED ORDER — TRAMADOL HCL 50 MG PO TABS: 50.0000 mg | ORAL_TABLET | Freq: Four times a day (QID) | ORAL | 0 refills | Status: AC | PRN

## 2017-05-02 MED ORDER — MIDAZOLAM HCL 2 MG/2ML IJ SOLN
INTRAMUSCULAR | Status: AC
  Filled 2017-05-02: qty 2

## 2017-05-02 MED ORDER — SODIUM CHLORIDE 0.9 % IR SOLN
Status: DC | PRN
  Administered 2017-05-02: 3000 mL

## 2017-05-02 MED ORDER — FENTANYL CITRATE (PF) 100 MCG/2ML IJ SOLN: 25.0000 ug | INTRAMUSCULAR | Status: DC | PRN

## 2017-05-02 MED ORDER — PROPOFOL 10 MG/ML IV BOLUS
INTRAVENOUS | Status: AC
  Filled 2017-05-02: qty 20

## 2017-05-02 SURGICAL SUPPLY — 25 items
BAG DRAIN URO TABLE W/ADPT NS (DRAPE) ×3 IMPLANT
BAG HAMPER (MISCELLANEOUS) ×3 IMPLANT
CATH INTERMIT  6FR 70CM (CATHETERS) IMPLANT
CLOTH BEACON ORANGE TIMEOUT ST (SAFETY) ×3 IMPLANT
GLOVE BIO SURGEON STRL SZ7.5 (GLOVE) ×3 IMPLANT
GLOVE BIO SURGEON STRL SZ8 (GLOVE) ×3 IMPLANT
GLOVE BIOGEL PI IND STRL 7.0 (GLOVE) ×4 IMPLANT
GLOVE BIOGEL PI INDICATOR 7.0 (GLOVE) ×2
GLOVE ECLIPSE 6.5 STRL STRAW (GLOVE) ×3 IMPLANT
GOWN STRL REUS W/ TWL LRG LVL3 (GOWN DISPOSABLE) ×4 IMPLANT
GOWN STRL REUS W/ TWL XL LVL3 (GOWN DISPOSABLE) ×2 IMPLANT
GOWN STRL REUS W/TWL LRG LVL3 (GOWN DISPOSABLE) ×5 IMPLANT
GOWN STRL REUS W/TWL XL LVL3 (GOWN DISPOSABLE) ×4 IMPLANT
HOLDER FOLEY CATH W/STRAP (MISCELLANEOUS) ×3 IMPLANT
IV NS IRRIG 3000ML ARTHROMATIC (IV SOLUTION) ×6 IMPLANT
KIT ROOM TURNOVER AP CYSTO (KITS) ×3 IMPLANT
MANIFOLD NEPTUNE II (INSTRUMENTS) ×3 IMPLANT
NEEDLE HYPO 18GX1.5 BLUNT FILL (NEEDLE) ×3 IMPLANT
PACK CYSTO (CUSTOM PROCEDURE TRAY) ×3 IMPLANT
PAD ARMBOARD 7.5X6 YLW CONV (MISCELLANEOUS) ×3 IMPLANT
PAD TELFA 3X4 1S STER (GAUZE/BANDAGES/DRESSINGS) ×3 IMPLANT
SYSTEM UROLIFT (Male Continence) ×12 IMPLANT
TRAY FOLEY CATH SILVER 16FR (SET/KITS/TRAYS/PACK) ×3 IMPLANT
WATER STERILE IRR 1000ML POUR (IV SOLUTION) ×3 IMPLANT
WATER STERILE IRR 3000ML UROMA (IV SOLUTION) ×3 IMPLANT

## 2017-05-02 NOTE — Anesthesia Preprocedure Evaluation (Signed)
Anesthesia Evaluation  Patient identified by MRN, date of birth, ID band Patient awake    Reviewed: Allergy & Precautions, NPO status , Patient's Chart, lab work & pertinent test results  Airway Mallampati: I  TM Distance: >3 FB Neck ROM: Full    Dental no notable dental hx. (+) Teeth Intact, Partial Upper   Pulmonary neg pulmonary ROS, shortness of breath and with exertion, former smoker,    Pulmonary exam normal breath sounds clear to auscultation       Cardiovascular hypertension, + CAD, + Past MI, +CHF and + DOE  Normal cardiovascular exam Rhythm:Regular Rate:Normal  Echo: - Left ventricle: The cavity size was normal. Wall thickness was increased in a pattern of mild LVH. There was moderate focal basal septal hypertrophy. Systolic function was normal. The estimated ejection fraction was in the range of 55% to 60%. Wall motion was normal; there were no regional wall motion abnormalities. Doppler parameters are consistent with abnormal left ventricular relaxation (grade 1 diastolic dysfunction). - Aortic valve: Mildly thickened leaflets. There was trivial regurgitation.     Neuro/Psych PSYCHIATRIC DISORDERS Dementia Dementia  negative neurological ROS  negative psych ROS   GI/Hepatic negative GI ROS,   Endo/Other  negative endocrine ROSHypothyroidism   Renal/GU negative Renal ROS  negative genitourinary   Musculoskeletal negative musculoskeletal ROS (+)   Abdominal   Peds negative pediatric ROS (+)  Hematology negative hematology ROS (+)   Anesthesia Other Findings   Reproductive/Obstetrics negative OB ROS                             Anesthesia Physical Anesthesia Plan  ASA: III  Anesthesia Plan: General   Post-op Pain Management:    Induction: Intravenous  PONV Risk Score and Plan:   Airway Management Planned: LMA  Additional Equipment:   Intra-op Plan:    Post-operative Plan: Extubation in OR  Informed Consent: I have reviewed the patients History and Physical, chart, labs and discussed the procedure including the risks, benefits and alternatives for the proposed anesthesia with the patient or authorized representative who has indicated his/her understanding and acceptance.     Plan Discussed with:   Anesthesia Plan Comments:         Anesthesia Quick Evaluation

## 2017-05-02 NOTE — Transfer of Care (Signed)
Immediate Anesthesia Transfer of Care Note  Patient: Kurt French.  Procedure(s) Performed: CYSTOSCOPY WITH INSERTION OF UROLIFT (N/A Prostate) CYSTOSCOPY WITH BIOPSY (N/A Prostate)  Patient Location: PACU  Anesthesia Type:General  Level of Consciousness: awake and patient cooperative  Airway & Oxygen Therapy: Patient Spontanous Breathing and Patient connected to face mask oxygen  Post-op Assessment: Report given to RN, Post -op Vital signs reviewed and stable and Patient moving all extremities  Post vital signs: Reviewed and stable  Last Vitals:  Vitals:   05/02/17 0925 05/02/17 0930  BP: (!) 144/79 (!) 150/74  Pulse:    Resp: 18 19  Temp:    SpO2: 97% 98%    Last Pain:  Vitals:   05/02/17 0801  TempSrc: Oral      Patients Stated Pain Goal: 7 (29/47/65 4650)  Complications: No apparent anesthesia complications

## 2017-05-02 NOTE — Anesthesia Procedure Notes (Signed)
Procedure Name: LMA Insertion Date/Time: 05/02/2017 9:49 AM Performed by: Charmaine Downs, CRNA Pre-anesthesia Checklist: Patient identified, Patient being monitored, Emergency Drugs available, Timeout performed and Suction available Patient Re-evaluated:Patient Re-evaluated prior to induction Oxygen Delivery Method: Circle System Utilized Preoxygenation: Pre-oxygenation with 100% oxygen Induction Type: IV induction Ventilation: Mask ventilation without difficulty LMA: LMA inserted LMA Size: 4.0 Number of attempts: 1 Placement Confirmation: positive ETCO2 and breath sounds checked- equal and bilateral Tube secured with: Tape Dental Injury: Teeth and Oropharynx as per pre-operative assessment

## 2017-05-02 NOTE — Discharge Instructions (Signed)
Bladder Biopsy, Care After Refer to this sheet in the next few weeks. These instructions provide you with information about caring for yourself after your procedure. Your health care provider may also give you more specific instructions. Your treatment has been planned according to current medical practices, but problems sometimes occur. Call your health care provider if you have any problems or questions after your procedure. What can I expect after the procedure? After the procedure, it is common to have:  Mild pain in your bladder or kidney area during urination.  Minor burning during urination.  Small amounts of blood in your urine.  A sudden urge to urinate.  A need to urinate more often than usual.  Follow these instructions at home: Medicines  Take over-the-counter and prescription medicines only as told by your health care provider.  If you were prescribed an antibiotic medicine, take it as told by your health care provider. Do not stop taking the antibiotic even if you start to feel better. General instructions   Take a warm bath to relieve any burning sensations around your urethra.  Hold a warm, damp washcloth over the urethral area to ease pain.  Return to your normal activities as told by your health care provider. Ask your health care provider what activities are safe for you.  Do not drive for 24 hours if you received a medicine to help you relax (sedative) during your procedure. Ask your health care provider when it is safe for you to drive.  It is your responsibility to get the results of your procedure. Ask your health care provider or the department performing the procedure when your results will be ready.  Keep all follow-up visits as told by your health care provider. This is important. Contact a health care provider if:  You have a fever.  Your symptoms do not improve within 24 hours and you continue to have: ? Burning during urination. ? Increasing  amounts of blood in your urine. ? Pain during urination. ? An urgent need to urinate. ? A need to urinate more often than usual. Get help right away if:  You have a lot of bleeding or more bleeding.  You have severe pain.  You are unable to urinate.  You have bright red blood in your urine.  You are passing blood clots in your urine.  You have a fever.  You have swelling, redness, or pain in your legs.  You have difficulty breathing. This information is not intended to replace advice given to you by your health care provider. Make sure you discuss any questions you have with your health care provider. Document Released: 03/04/2015 Document Revised: 07/24/2015 Document Reviewed: 03/04/2015 Elsevier Interactive Patient Education  2018 [employer].      Indwelling Urinary Catheter Care, Adult Take good care of your catheter to keep it working and to prevent problems. How to wear your catheter Attach your catheter to your leg with tape (adhesive tape) or a leg strap. Make sure it is not too tight. If you use tape, remove any bits of tape that are already on the catheter. How to wear a drainage bag You should have:  A large overnight bag.  A small leg bag.  Overnight Bag You may wear the overnight bag at any time. Always keep the bag below the level of your bladder but off the floor. When you sleep, put a clean plastic bag in a wastebasket. Then hang the bag inside the wastebasket. Leg Bag Never wear  the leg bag at night. Always wear the leg bag below your knee. Keep the leg bag secure with a leg strap or tape. How to care for your skin  Clean the skin around the catheter at least once every day.  Shower every day. Do not take baths.  Put creams, lotions, or ointments on your genital area only as told by your doctor.  Do not use powders, sprays, or lotions on your genital area. How to clean your catheter and your skin 1. Wash your hands with soap and water. 2. Wet  a washcloth in warm water and gentle (mild) soap. 3. Use the washcloth to clean the skin where the catheter enters your body. Clean downward and wipe away from the catheter in small circles. Do not wipe toward the catheter. 4. Pat the area dry with a clean towel. Make sure to clean off all soap. How to care for your drainage bags Empty your drainage bag when it is ?- full or at least 2-3 times a day. Replace your drainage bag once a month or sooner if it starts to smell bad or look dirty. Do not clean your drainage bag unless told by your doctor. Emptying a drainage bag  Supplies Needed  Rubbing alcohol.  Gauze pad or cotton ball.  Tape or a leg strap.  Steps 1. Wash your hands with soap and water. 2. Separate (detach) the bag from your leg. 3. Hold the bag over the toilet or a clean container. Keep the bag below your hips and bladder. This stops pee (urine) from going back into the tube. 4. Open the pour spout at the bottom of the bag. 5. Empty the pee into the toilet or container. Do not let the pour spout touch any surface. 6. Put rubbing alcohol on a gauze pad or cotton ball. 7. Use the gauze pad or cotton ball to clean the pour spout. 8. Close the pour spout. 9. Attach the bag to your leg with tape or a leg strap. 10. Wash your hands.  Changing a drainage bag Supplies Needed  Alcohol wipes.  A clean drainage bag.  Adhesive tape or a leg strap.  Steps 1. Wash your hands with soap and water. 2. Separate the dirty bag from your leg. 3. Pinch the rubber catheter with your fingers so that pee does not spill out. 4. Separate the catheter tube from the drainage tube where these tubes connect (at the connection valve). Do not let the tubes touch any surface. 5. Clean the end of the catheter tube with an alcohol wipe. Use a different alcohol wipe to clean the end of the drainage tube. 6. Connect the catheter tube to the drainage tube of the clean bag. 7. Attach the new bag to  the leg with adhesive tape or a leg strap. 8. Wash your hands.  How to prevent infection and other problems  Never pull on your catheter or try to remove it. Pulling can damage tissue in your body.  Always wash your hands before and after touching your catheter.  If a leg strap gets wet, replace it with a dry one.  Drink enough fluids to keep your pee clear or pale yellow, or as told by your doctor.  Do not let the drainage bag or tubing touch the floor.  Wear cotton underwear.  If you are male, wipe from front to back after you poop (have a bowel movement).  Check on the catheter often to make sure it works and  the tubing is not twisted. Get help if:  Your pee is cloudy.  Your pee smells unusually bad.  Your pee is not draining into the bag.  Your tube gets clogged.  Your catheter starts to leak.  Your bladder feels full. Get help right away if:  You have redness, swelling, or pain where the catheter enters your body.  You have fluid, pus, or a bad smell coming from the area where the catheter enters your body.  The area where the catheter enters your body feels warm.  You have a fever.  You have pain in your: ? Stomach (abdomen). ? Legs. ? Lower back. ? Bladder.  You see blood fill the catheter.  Your pee is pink or red.  You feel sick to your stomach (nauseous).  You throw up (vomit).  You have chills.  Your catheter gets pulled out. This information is not intended to replace advice given to you by your health care provider. Make sure you discuss any questions you have with your health care provider. Document Released: 06/12/2012 Document Revised: 01/14/2016 Document Reviewed: 07/31/2013 Elsevier Interactive Patient Education  2018 [place] POST-ANESTHESIA  IMMEDIATELY FOLLOWING SURGERY:  Do not drive or operate machinery for the first twenty four hours after surgery.  Do not make any important decisions for  twenty four hours after surgery or while taking narcotic pain medications or sedatives.  If you develop intractable nausea and vomiting or a severe headache please notify your doctor immediately.  FOLLOW-UP:  Please make an appointment with your surgeon as instructed. You do not need to follow up with anesthesia unless specifically instructed to do so.  WOUND CARE INSTRUCTIONS (if applicable):  Keep a dry clean dressing on the anesthesia/puncture wound site if there is drainage.  Once the wound has quit draining you may leave it open to air.  Generally you should leave the bandage intact for twenty four hours unless there is drainage.  If the epidural site drains for more than 36-48 hours please call the anesthesia department.  QUESTIONS?:  Please feel free to call your physician or the hospital operator if you have any questions, and they will be happy to assist you.

## 2017-05-02 NOTE — H&P (Signed)
Urology Admission H&P  Chief Complaint: bladder tumor and incomplete bladder emptyin  History of Present Illness: [name] is a 84yo with a hx of BPH s/p Urolift who continues to have incomplete emptying. On office cystoscopy he was found to have anterior obstructive prostate tissue and a bladder lesion ina  diverticulum  Past Medical History:  Diagnosis Date  . BPH (benign prostatic hypertrophy)   . Coronary artery disease cardiologist-  [name] (previously dr Domenic Polite)   abnormal stress test 11-07-2015 ; s/p  cardiac cath 11-27-2015 , non-obstructive cad and normal LVF (pLAD to mLAD 20%; mCx 20%)  . Dementia    per wife he has dementia but has not been dx by a md  . Essential hypertension, benign   . Glaucoma, both eyes   . Hypothyroidism   . Loose bowel movements   . Lower urinary tract symptoms (LUTS)   . Mixed hyperlipidemia   . OA (osteoarthritis)   . Personal history of MI (myocardial infarction)    per stress test 11-07-2015 large fixed inferior defect consistent w/ old infarction  . PVC's (premature ventricular contractions)   . RBBB (right bundle branch block)   . Wears glasses    Past Surgical History:  Procedure Laterality Date  . APPENDECTOMY  1948  . CARDIAC CATHETERIZATION N/A 11/27/2015   Procedure: Left Heart Cath and Coronary Angiography;  Surgeon: Burnell Blanks, MD;  Location: [facility];  Service: Cardiovascular;  Laterality: N/A;   MILD NON-OBSTRUCTIVE CAD, NORMAL LVSF, EF 55-65%  . CARDIAC CATHETERIZATION  08-23-2007   dr cooper   essentially normal coronary arteries w/ no obstructive cad, normal lvf, ef 65%  . CARDIOVASCULAR STRESS TEST  11-07-2015   [facility]   Intermediate risk nuclear study w/ large fixed inferior defect noted consistent with old infarction, mild reversibility in the inferior wall concerning for ischemia/  normal LV function and wall motion , ef 57%  . CATARACT EXTRACTION W/ INTRAOCULAR LENS   IMPLANT, BILATERAL  2015  approx.  . CYSTOSCOPY WITH INSERTION OF UROLIFT N/A 01/31/2017   Procedure: CYSTOSCOPY WITH INSERTION OF UROLIFT;  Surgeon: Cleon Gustin, MD;  Location: [facility];  Service: Urology;  Laterality: N/A;  . ERCP N/A 04/04/2017   Procedure: ENDOSCOPIC RETROGRADE CHOLANGIOPANCREATOGRAPHY (ERCP);  Surgeon: Rogene Houston, MD;  Location: [facility];  Service: Endoscopy;  Laterality: N/A;  . KNEE ARTHROPLASTY Left 02/19/2016   Procedure: LEFT TOTAL KNEE ARTHROPLASTY WITH COMPUTER NAVIGATION;  Surgeon: Rod Can, MD;  Location: [facility];  Service: Orthopedics;  Laterality: Left;  . STENT REMOVAL  04/04/2017   Procedure: BILIARY STENT REMOVAL;  Surgeon: Rogene Houston, MD;  Location: [facility];  Service: Endoscopy;;  . TONSILLECTOMY  child    Home Medications:  Current Facility-Administered Medications  Medication Dose Route Frequency Provider Last Rate Last Dose  . ceFAZolin (ANCEF) IVPB 2g/100 mL premix  2 g Intravenous 30 min Pre-Op Ziere Docken, Candee Furbish, MD      . lactated ringers infusion   Intravenous Continuous Lerry Liner, MD 75 mL/hr at 05/02/17 7619     Allergies: No Known Allergies  Family History  Problem Relation Age of Onset  . Colon cancer Mother   . Other Father        MVA   Social History:  reports that he quit smoking about 57 years ago. His smoking use included cigarettes. He has a 30.00 pack-year smoking history. he has never used smokeless  tobacco. He reports that he does not drink alcohol or use drugs.  Review of Systems  Genitourinary: Positive for urgency.  All other systems reviewed and are negative.   Physical Exam:  Vital signs in last 24 hours: Temp:  [98.4 F (36.9 C)] 98.4 F (36.9 C) (03/04 0801) Pulse Rate:  [50] 50 (03/04 0801) Resp:  [13-17] 16 (03/04 0845) BP: (140-162)/(73-85) 140/77 (03/04 0845) SpO2:  [95 %-98 %] 96 % (03/04 0845) Weight:  [90.7 kg (200 lb)] 90.7 kg (200 lb) (03/04  0801) Physical Exam  Constitutional: He is oriented to person, place, and time. He appears well-developed and well-nourished.  HENT:  Head: Normocephalic and atraumatic.  Eyes: EOM are normal. Pupils are equal, round, and reactive to light.  Neck: Normal range of motion. No thyromegaly present.  Cardiovascular: Normal rate and regular rhythm.  Respiratory: Effort normal. No respiratory distress.  GI: Soft. He exhibits no distension.  Musculoskeletal: Normal range of motion. He exhibits no edema.  Neurological: He is alert and oriented to person, place, and time.  Skin: Skin is warm and dry.  Psychiatric: He has a normal mood and affect. His behavior is normal. Judgment and thought content normal.    Laboratory Data:  No results found for this or any previous visit (from the past 24 hour(s)). No results found for this or any previous visit (from the past 240 hour(s)). Creatinine: No results for input(s): CREATININE in the last 168 hours. Baseline Creatinine: unknown  Impression/Assessment:  84yo with BPH and incomplete emptying, bladder lesion  Plan:  The risks/benefits/alternatves to bladder biopsy and urolift placement was explained to the patient and he understands and wishes to proceed with surgery  Nicolette Bang 05/02/2017, 9:11 AM

## 2017-05-02 NOTE — Progress Notes (Signed)
Approximately 41mm crusted black patch noted to left frontal mid scalp. Per wife"don't know where that came from". Per patient, "didn't know was there". Site cleansed with warm water and they will see his family doctor if persists.  No streaking or drainage noted to site.

## 2017-05-02 NOTE — Op Note (Signed)
   PREOPERATIVE DIAGNOSIS: Benign prostatic hypertrophy with bladder outlet obstruction. Bladder tumor  POSTOPERATIVE DIAGNOSIS: same  PROCEDURE: 1. Cystoscopy with implantation of UroLift devices, 4 implants. 2. Bladder biopsy with fulgeration  SURGEON: Nicolette Bang, M.D.  ANESTHESIA: General  ANTIBIOTICS: ancef  SPECIMEN: Left lateral wall diverticulum erythematous lesion.  DRAINS: A 16-French Foley catheter.  BLOOD LOSS: Minimal.  COMPLICATIONS: None.  Findings: apical and bladder neck residual tissue causes moderate obstruction. Left lateral wall erythematous lesions in diverticulum concerning for CIS  INDICATIONS:The Patient is an 84 year old  male with BPH and bladder outlet obstruction. He has failed medical therapy and has elected UroLift for definitive treatment. He has a bladder lesion found on office cystoscopy and has also elected for bladder biopsy  FINDINGS OF PROCEDURE: He was taken to the operating room where a genral anesthetic was induced. He was placed in lithotomy position and was fitted with PAS hose. His perineum and genitalia were prepped with chlorhexidine, and he was draped in usual sterile fashion.  A rigid 5 French cystoscope was passed in the urethra and the bladder. Bladder was inspected and we noted 2 scars consistent with previous biopsy. We noted an erythematous lesion in a left lateral wall bladder diverticulum. the ureteral orifices were in the normal orthotopic locations. We proceeded to obtain multiple biopsies from the posterior wall where there were areas of erythema. Hemostasis was then obtained with a bugbee. Cystoscopy was then performed using the UroLift scope and 0 degree lens. Examination revealed a normal urethra. The external sphincter was intact. Prostatic urethra was approximately 6 cm in length with lateral lobe enlargement. There was also little bit of bladder neck elevation. Inspection of bladder  revealed mild-to-moderate trabeculation with no tumors, stones, or inflammation. No cellules or diverticula were noted. Ureteral orifices were in their normal anatomic position effluxing clear urine.  After initial cystoscopy, the visual obturator was replaced with the first UroLift device. This was turned to the 9 o'clock position and pulled back to the veru and then slightly advanced. Pressure was then applied to the right lateral lobe and the UroLift device was deployed.  The second UroLift device was then inserted and applied to the left lateral lobe at 3 o'clock and deployed in the mid prostatic urethra. After this, there was still some apparent obstruction closer to the bladder neck. So a second level of UroLift your left device was applied between the mid urethra and the proximal urethra providing further patency to the prostatic urethra. At this point, there was mild bleeding but the patient did have a spinal anesthetic. So it was thought that a Foley catheter was indicated. The scope was removed and a 16-French Foley catheter was inserted without difficulty. The balloon was filled with 10 mL sterile fluid, and the catheter was placed to straight drainage.  COMPLICATIONS: None   CONDITION: Stable, extubated, transferred to PACU  PLAN: The patient will be discharged home and followup in 2 days for a voiding trial.

## 2017-05-02 NOTE — Anesthesia Postprocedure Evaluation (Signed)
Anesthesia Post Note  Patient: Kurt Ghan Bogden Sr.  Procedure(s) Performed: CYSTOSCOPY WITH INSERTION OF UROLIFT (N/A Prostate) CYSTOSCOPY WITH BLADDER BIOPSY AND FULGURATION (N/A Bladder)  Patient location during evaluation: PACU Anesthesia Type: General Level of consciousness: awake and alert and patient cooperative Pain management: pain level controlled Vital Signs Assessment: post-procedure vital signs reviewed and stable Respiratory status: spontaneous breathing, nonlabored ventilation and respiratory function stable Cardiovascular status: blood pressure returned to baseline Postop Assessment: no apparent nausea or vomiting Anesthetic complications: no     Last Vitals:  Vitals:   05/02/17 0930 05/02/17 1030  BP: (!) 150/74 (!) 165/88  Pulse:  (!) 56  Resp: 19 19  Temp:  36.6 C  SpO2: 98% 100%    Last Pain:  Vitals:   05/02/17 0801  TempSrc: Oral                 Demorris Choyce J

## 2017-05-03 ENCOUNTER — Encounter (HOSPITAL_COMMUNITY): Payer: Self-pay | Admitting: Urology

## 2017-05-04 ENCOUNTER — Ambulatory Visit (INDEPENDENT_AMBULATORY_CARE_PROVIDER_SITE_OTHER): Payer: Medicare HMO | Admitting: Urology

## 2017-05-04 DIAGNOSIS — N401 Enlarged prostate with lower urinary tract symptoms: Secondary | ICD-10-CM | POA: Diagnosis not present

## 2017-05-06 ENCOUNTER — Ambulatory Visit (INDEPENDENT_AMBULATORY_CARE_PROVIDER_SITE_OTHER): Payer: Medicare HMO | Admitting: Urology

## 2017-05-06 DIAGNOSIS — N401 Enlarged prostate with lower urinary tract symptoms: Secondary | ICD-10-CM | POA: Diagnosis not present

## 2017-05-09 DIAGNOSIS — R3915 Urgency of urination: Secondary | ICD-10-CM | POA: Diagnosis not present

## 2017-05-09 DIAGNOSIS — N401 Enlarged prostate with lower urinary tract symptoms: Secondary | ICD-10-CM | POA: Diagnosis not present

## 2017-05-10 DIAGNOSIS — I10 Essential (primary) hypertension: Secondary | ICD-10-CM | POA: Diagnosis not present

## 2017-05-10 DIAGNOSIS — E039 Hypothyroidism, unspecified: Secondary | ICD-10-CM | POA: Diagnosis not present

## 2017-05-10 DIAGNOSIS — N179 Acute kidney failure, unspecified: Secondary | ICD-10-CM | POA: Diagnosis not present

## 2017-05-10 DIAGNOSIS — N182 Chronic kidney disease, stage 2 (mild): Secondary | ICD-10-CM | POA: Diagnosis not present

## 2017-05-12 DIAGNOSIS — N182 Chronic kidney disease, stage 2 (mild): Secondary | ICD-10-CM | POA: Diagnosis not present

## 2017-05-12 DIAGNOSIS — N179 Acute kidney failure, unspecified: Secondary | ICD-10-CM | POA: Diagnosis not present

## 2017-05-12 DIAGNOSIS — K851 Biliary acute pancreatitis without necrosis or infection: Secondary | ICD-10-CM | POA: Diagnosis not present

## 2017-05-12 DIAGNOSIS — K8309 Other cholangitis: Secondary | ICD-10-CM | POA: Diagnosis not present

## 2017-05-12 DIAGNOSIS — N401 Enlarged prostate with lower urinary tract symptoms: Secondary | ICD-10-CM | POA: Diagnosis not present

## 2017-05-12 DIAGNOSIS — I10 Essential (primary) hypertension: Secondary | ICD-10-CM | POA: Diagnosis not present

## 2017-05-12 DIAGNOSIS — R69 Illness, unspecified: Secondary | ICD-10-CM | POA: Diagnosis not present

## 2017-05-12 DIAGNOSIS — Z6832 Body mass index (BMI) 32.0-32.9, adult: Secondary | ICD-10-CM | POA: Diagnosis not present

## 2017-06-14 ENCOUNTER — Other Ambulatory Visit (INDEPENDENT_AMBULATORY_CARE_PROVIDER_SITE_OTHER): Payer: Self-pay | Admitting: Internal Medicine

## 2017-06-15 NOTE — Telephone Encounter (Signed)
Wife will need to talk to the PCP prior to the 3 months , to allow them to fill this medication.

## 2017-06-16 DIAGNOSIS — N323 Diverticulum of bladder: Secondary | ICD-10-CM | POA: Diagnosis not present

## 2017-06-16 DIAGNOSIS — R3915 Urgency of urination: Secondary | ICD-10-CM | POA: Diagnosis not present

## 2017-06-16 DIAGNOSIS — R3914 Feeling of incomplete bladder emptying: Secondary | ICD-10-CM | POA: Diagnosis not present

## 2017-06-28 DIAGNOSIS — D485 Neoplasm of uncertain behavior of skin: Secondary | ICD-10-CM | POA: Diagnosis not present

## 2017-06-28 DIAGNOSIS — L570 Actinic keratosis: Secondary | ICD-10-CM | POA: Diagnosis not present

## 2017-06-28 DIAGNOSIS — C4441 Basal cell carcinoma of skin of scalp and neck: Secondary | ICD-10-CM | POA: Diagnosis not present

## 2017-06-28 DIAGNOSIS — Z85828 Personal history of other malignant neoplasm of skin: Secondary | ICD-10-CM | POA: Diagnosis not present

## 2017-07-14 DIAGNOSIS — C4441 Basal cell carcinoma of skin of scalp and neck: Secondary | ICD-10-CM | POA: Diagnosis not present

## 2017-08-02 DIAGNOSIS — H353222 Exudative age-related macular degeneration, left eye, with inactive choroidal neovascularization: Secondary | ICD-10-CM | POA: Diagnosis not present

## 2017-08-02 DIAGNOSIS — H401133 Primary open-angle glaucoma, bilateral, severe stage: Secondary | ICD-10-CM | POA: Diagnosis not present

## 2017-08-02 DIAGNOSIS — H353114 Nonexudative age-related macular degeneration, right eye, advanced atrophic with subfoveal involvement: Secondary | ICD-10-CM | POA: Diagnosis not present

## 2017-08-02 DIAGNOSIS — Z961 Presence of intraocular lens: Secondary | ICD-10-CM | POA: Diagnosis not present

## 2017-08-03 DIAGNOSIS — E039 Hypothyroidism, unspecified: Secondary | ICD-10-CM | POA: Diagnosis not present

## 2017-08-03 DIAGNOSIS — I10 Essential (primary) hypertension: Secondary | ICD-10-CM | POA: Diagnosis not present

## 2017-08-08 DIAGNOSIS — Z6833 Body mass index (BMI) 33.0-33.9, adult: Secondary | ICD-10-CM | POA: Diagnosis not present

## 2017-08-08 DIAGNOSIS — N182 Chronic kidney disease, stage 2 (mild): Secondary | ICD-10-CM | POA: Diagnosis not present

## 2017-08-08 DIAGNOSIS — E039 Hypothyroidism, unspecified: Secondary | ICD-10-CM | POA: Diagnosis not present

## 2017-08-08 DIAGNOSIS — K8309 Other cholangitis: Secondary | ICD-10-CM | POA: Diagnosis not present

## 2017-08-08 DIAGNOSIS — N401 Enlarged prostate with lower urinary tract symptoms: Secondary | ICD-10-CM | POA: Diagnosis not present

## 2017-08-08 DIAGNOSIS — R69 Illness, unspecified: Secondary | ICD-10-CM | POA: Diagnosis not present

## 2017-08-08 DIAGNOSIS — I10 Essential (primary) hypertension: Secondary | ICD-10-CM | POA: Diagnosis not present

## 2017-08-15 DIAGNOSIS — H353132 Nonexudative age-related macular degeneration, bilateral, intermediate dry stage: Secondary | ICD-10-CM | POA: Diagnosis not present

## 2017-08-15 DIAGNOSIS — H35729 Serous detachment of retinal pigment epithelium, unspecified eye: Secondary | ICD-10-CM | POA: Diagnosis not present

## 2017-08-15 DIAGNOSIS — H401123 Primary open-angle glaucoma, left eye, severe stage: Secondary | ICD-10-CM | POA: Diagnosis not present

## 2017-08-15 DIAGNOSIS — H401111 Primary open-angle glaucoma, right eye, mild stage: Secondary | ICD-10-CM | POA: Diagnosis not present

## 2017-09-03 DIAGNOSIS — S0502XA Injury of conjunctiva and corneal abrasion without foreign body, left eye, initial encounter: Secondary | ICD-10-CM | POA: Diagnosis not present

## 2017-09-03 DIAGNOSIS — X58XXXA Exposure to other specified factors, initial encounter: Secondary | ICD-10-CM | POA: Diagnosis not present

## 2017-09-03 DIAGNOSIS — R21 Rash and other nonspecific skin eruption: Secondary | ICD-10-CM | POA: Diagnosis not present

## 2017-09-03 DIAGNOSIS — R030 Elevated blood-pressure reading, without diagnosis of hypertension: Secondary | ICD-10-CM | POA: Diagnosis not present

## 2017-11-04 DIAGNOSIS — I10 Essential (primary) hypertension: Secondary | ICD-10-CM | POA: Diagnosis not present

## 2017-11-04 DIAGNOSIS — N182 Chronic kidney disease, stage 2 (mild): Secondary | ICD-10-CM | POA: Diagnosis not present

## 2017-11-04 DIAGNOSIS — E039 Hypothyroidism, unspecified: Secondary | ICD-10-CM | POA: Diagnosis not present

## 2017-11-07 DIAGNOSIS — H9190 Unspecified hearing loss, unspecified ear: Secondary | ICD-10-CM | POA: Diagnosis not present

## 2017-11-07 DIAGNOSIS — Z6834 Body mass index (BMI) 34.0-34.9, adult: Secondary | ICD-10-CM | POA: Diagnosis not present

## 2017-11-07 DIAGNOSIS — E039 Hypothyroidism, unspecified: Secondary | ICD-10-CM | POA: Diagnosis not present

## 2017-11-07 DIAGNOSIS — N182 Chronic kidney disease, stage 2 (mild): Secondary | ICD-10-CM | POA: Diagnosis not present

## 2017-11-07 DIAGNOSIS — H409 Unspecified glaucoma: Secondary | ICD-10-CM | POA: Diagnosis not present

## 2017-11-07 DIAGNOSIS — M4306 Spondylolysis, lumbar region: Secondary | ICD-10-CM | POA: Diagnosis not present

## 2017-11-07 DIAGNOSIS — I10 Essential (primary) hypertension: Secondary | ICD-10-CM | POA: Diagnosis not present

## 2017-11-07 DIAGNOSIS — G309 Alzheimer's disease, unspecified: Secondary | ICD-10-CM | POA: Diagnosis not present

## 2017-11-11 DIAGNOSIS — G319 Degenerative disease of nervous system, unspecified: Secondary | ICD-10-CM | POA: Diagnosis not present

## 2017-11-11 DIAGNOSIS — I6782 Cerebral ischemia: Secondary | ICD-10-CM | POA: Diagnosis not present

## 2017-11-11 DIAGNOSIS — H9190 Unspecified hearing loss, unspecified ear: Secondary | ICD-10-CM | POA: Diagnosis not present

## 2017-11-11 DIAGNOSIS — R413 Other amnesia: Secondary | ICD-10-CM | POA: Diagnosis not present

## 2017-11-11 DIAGNOSIS — G309 Alzheimer's disease, unspecified: Secondary | ICD-10-CM | POA: Diagnosis not present

## 2017-12-12 DIAGNOSIS — H401123 Primary open-angle glaucoma, left eye, severe stage: Secondary | ICD-10-CM | POA: Diagnosis not present

## 2017-12-12 DIAGNOSIS — H401111 Primary open-angle glaucoma, right eye, mild stage: Secondary | ICD-10-CM | POA: Diagnosis not present

## 2017-12-12 DIAGNOSIS — H353132 Nonexudative age-related macular degeneration, bilateral, intermediate dry stage: Secondary | ICD-10-CM | POA: Diagnosis not present

## 2017-12-12 DIAGNOSIS — H35729 Serous detachment of retinal pigment epithelium, unspecified eye: Secondary | ICD-10-CM | POA: Diagnosis not present

## 2017-12-26 DIAGNOSIS — C4402 Squamous cell carcinoma of skin of lip: Secondary | ICD-10-CM | POA: Diagnosis not present

## 2017-12-27 DIAGNOSIS — L570 Actinic keratosis: Secondary | ICD-10-CM | POA: Diagnosis not present

## 2017-12-27 DIAGNOSIS — C4402 Squamous cell carcinoma of skin of lip: Secondary | ICD-10-CM | POA: Diagnosis not present

## 2018-01-03 DIAGNOSIS — D519 Vitamin B12 deficiency anemia, unspecified: Secondary | ICD-10-CM | POA: Diagnosis not present

## 2018-01-03 DIAGNOSIS — N179 Acute kidney failure, unspecified: Secondary | ICD-10-CM | POA: Diagnosis not present

## 2018-01-03 DIAGNOSIS — N182 Chronic kidney disease, stage 2 (mild): Secondary | ICD-10-CM | POA: Diagnosis not present

## 2018-01-03 DIAGNOSIS — I10 Essential (primary) hypertension: Secondary | ICD-10-CM | POA: Diagnosis not present

## 2018-01-05 DIAGNOSIS — N3000 Acute cystitis without hematuria: Secondary | ICD-10-CM | POA: Diagnosis not present

## 2018-01-05 DIAGNOSIS — N401 Enlarged prostate with lower urinary tract symptoms: Secondary | ICD-10-CM | POA: Diagnosis not present

## 2018-01-05 DIAGNOSIS — R3915 Urgency of urination: Secondary | ICD-10-CM | POA: Diagnosis not present

## 2018-01-11 DIAGNOSIS — Z6834 Body mass index (BMI) 34.0-34.9, adult: Secondary | ICD-10-CM | POA: Diagnosis not present

## 2018-01-11 DIAGNOSIS — Z23 Encounter for immunization: Secondary | ICD-10-CM | POA: Diagnosis not present

## 2018-01-11 DIAGNOSIS — I10 Essential (primary) hypertension: Secondary | ICD-10-CM | POA: Diagnosis not present

## 2018-01-11 DIAGNOSIS — Z0001 Encounter for general adult medical examination with abnormal findings: Secondary | ICD-10-CM | POA: Diagnosis not present

## 2018-02-02 DIAGNOSIS — H401133 Primary open-angle glaucoma, bilateral, severe stage: Secondary | ICD-10-CM | POA: Diagnosis not present

## 2018-02-02 DIAGNOSIS — H40052 Ocular hypertension, left eye: Secondary | ICD-10-CM | POA: Diagnosis not present

## 2018-02-21 DIAGNOSIS — J019 Acute sinusitis, unspecified: Secondary | ICD-10-CM | POA: Diagnosis not present

## 2018-02-21 DIAGNOSIS — Z6834 Body mass index (BMI) 34.0-34.9, adult: Secondary | ICD-10-CM | POA: Diagnosis not present

## 2018-04-04 DIAGNOSIS — R509 Fever, unspecified: Secondary | ICD-10-CM | POA: Diagnosis not present

## 2018-04-04 DIAGNOSIS — B9789 Other viral agents as the cause of diseases classified elsewhere: Secondary | ICD-10-CM | POA: Diagnosis not present

## 2018-04-04 DIAGNOSIS — J069 Acute upper respiratory infection, unspecified: Secondary | ICD-10-CM | POA: Diagnosis not present

## 2018-04-04 DIAGNOSIS — R05 Cough: Secondary | ICD-10-CM | POA: Diagnosis not present

## 2018-04-04 DIAGNOSIS — J111 Influenza due to unidentified influenza virus with other respiratory manifestations: Secondary | ICD-10-CM | POA: Diagnosis not present

## 2018-04-04 DIAGNOSIS — Z20828 Contact with and (suspected) exposure to other viral communicable diseases: Secondary | ICD-10-CM | POA: Diagnosis not present

## 2018-04-04 DIAGNOSIS — J09X2 Influenza due to identified novel influenza A virus with other respiratory manifestations: Secondary | ICD-10-CM | POA: Diagnosis not present

## 2018-04-10 DIAGNOSIS — J209 Acute bronchitis, unspecified: Secondary | ICD-10-CM | POA: Diagnosis not present

## 2018-04-10 DIAGNOSIS — J111 Influenza due to unidentified influenza virus with other respiratory manifestations: Secondary | ICD-10-CM | POA: Diagnosis not present

## 2018-04-10 DIAGNOSIS — Z6833 Body mass index (BMI) 33.0-33.9, adult: Secondary | ICD-10-CM | POA: Diagnosis not present

## 2018-04-24 DIAGNOSIS — E559 Vitamin D deficiency, unspecified: Secondary | ICD-10-CM | POA: Diagnosis not present

## 2018-04-24 DIAGNOSIS — E039 Hypothyroidism, unspecified: Secondary | ICD-10-CM | POA: Diagnosis not present

## 2018-04-24 DIAGNOSIS — I10 Essential (primary) hypertension: Secondary | ICD-10-CM | POA: Diagnosis not present

## 2018-04-24 DIAGNOSIS — N182 Chronic kidney disease, stage 2 (mild): Secondary | ICD-10-CM | POA: Diagnosis not present

## 2018-04-24 DIAGNOSIS — G309 Alzheimer's disease, unspecified: Secondary | ICD-10-CM | POA: Diagnosis not present

## 2018-04-27 DIAGNOSIS — N323 Diverticulum of bladder: Secondary | ICD-10-CM | POA: Diagnosis not present

## 2018-04-27 DIAGNOSIS — R972 Elevated prostate specific antigen [PSA]: Secondary | ICD-10-CM | POA: Diagnosis not present

## 2018-04-27 DIAGNOSIS — H409 Unspecified glaucoma: Secondary | ICD-10-CM | POA: Diagnosis not present

## 2018-04-27 DIAGNOSIS — M4306 Spondylolysis, lumbar region: Secondary | ICD-10-CM | POA: Diagnosis not present

## 2018-04-27 DIAGNOSIS — N182 Chronic kidney disease, stage 2 (mild): Secondary | ICD-10-CM | POA: Diagnosis not present

## 2018-04-27 DIAGNOSIS — G4733 Obstructive sleep apnea (adult) (pediatric): Secondary | ICD-10-CM | POA: Diagnosis not present

## 2018-04-27 DIAGNOSIS — I10 Essential (primary) hypertension: Secondary | ICD-10-CM | POA: Diagnosis not present

## 2018-04-27 DIAGNOSIS — Z6834 Body mass index (BMI) 34.0-34.9, adult: Secondary | ICD-10-CM | POA: Diagnosis not present

## 2018-04-28 DIAGNOSIS — M4306 Spondylolysis, lumbar region: Secondary | ICD-10-CM | POA: Diagnosis not present

## 2018-04-28 DIAGNOSIS — G4733 Obstructive sleep apnea (adult) (pediatric): Secondary | ICD-10-CM | POA: Diagnosis not present

## 2018-04-28 DIAGNOSIS — H409 Unspecified glaucoma: Secondary | ICD-10-CM | POA: Diagnosis not present

## 2018-04-28 DIAGNOSIS — I10 Essential (primary) hypertension: Secondary | ICD-10-CM | POA: Diagnosis not present

## 2018-05-13 DIAGNOSIS — R531 Weakness: Secondary | ICD-10-CM | POA: Diagnosis not present

## 2018-05-13 DIAGNOSIS — T794XXA Traumatic shock, initial encounter: Secondary | ICD-10-CM | POA: Diagnosis not present

## 2018-05-13 DIAGNOSIS — S32039A Unspecified fracture of third lumbar vertebra, initial encounter for closed fracture: Secondary | ICD-10-CM | POA: Diagnosis not present

## 2018-05-13 DIAGNOSIS — R109 Unspecified abdominal pain: Secondary | ICD-10-CM | POA: Diagnosis not present

## 2018-05-13 DIAGNOSIS — S32059A Unspecified fracture of fifth lumbar vertebra, initial encounter for closed fracture: Secondary | ICD-10-CM | POA: Diagnosis not present

## 2018-05-13 DIAGNOSIS — S36892A Contusion of other intra-abdominal organs, initial encounter: Secondary | ICD-10-CM | POA: Diagnosis not present

## 2018-05-13 DIAGNOSIS — S36899A Unspecified injury of other intra-abdominal organs, initial encounter: Secondary | ICD-10-CM | POA: Diagnosis not present

## 2018-05-13 DIAGNOSIS — R61 Generalized hyperhidrosis: Secondary | ICD-10-CM | POA: Diagnosis not present

## 2018-05-13 DIAGNOSIS — S32019A Unspecified fracture of first lumbar vertebra, initial encounter for closed fracture: Secondary | ICD-10-CM | POA: Diagnosis not present

## 2018-05-13 DIAGNOSIS — W19XXXA Unspecified fall, initial encounter: Secondary | ICD-10-CM | POA: Diagnosis not present

## 2018-05-13 DIAGNOSIS — S32049A Unspecified fracture of fourth lumbar vertebra, initial encounter for closed fracture: Secondary | ICD-10-CM | POA: Diagnosis not present

## 2018-05-13 DIAGNOSIS — I493 Ventricular premature depolarization: Secondary | ICD-10-CM | POA: Diagnosis not present

## 2018-05-13 DIAGNOSIS — S32009A Unspecified fracture of unspecified lumbar vertebra, initial encounter for closed fracture: Secondary | ICD-10-CM | POA: Diagnosis not present

## 2018-05-13 DIAGNOSIS — Z043 Encounter for examination and observation following other accident: Secondary | ICD-10-CM | POA: Diagnosis not present

## 2018-05-13 DIAGNOSIS — R9431 Abnormal electrocardiogram [ECG] [EKG]: Secondary | ICD-10-CM | POA: Diagnosis not present

## 2018-05-13 DIAGNOSIS — S36898A Other injury of other intra-abdominal organs, initial encounter: Secondary | ICD-10-CM | POA: Diagnosis not present

## 2018-05-13 DIAGNOSIS — R609 Edema, unspecified: Secondary | ICD-10-CM | POA: Diagnosis not present

## 2018-05-13 DIAGNOSIS — M4804 Spinal stenosis, thoracic region: Secondary | ICD-10-CM | POA: Diagnosis not present

## 2018-05-13 DIAGNOSIS — R578 Other shock: Secondary | ICD-10-CM | POA: Diagnosis not present

## 2018-05-13 DIAGNOSIS — Z743 Need for continuous supervision: Secondary | ICD-10-CM | POA: Diagnosis not present

## 2018-05-13 DIAGNOSIS — S32029A Unspecified fracture of second lumbar vertebra, initial encounter for closed fracture: Secondary | ICD-10-CM | POA: Diagnosis not present

## 2018-05-13 DIAGNOSIS — R231 Pallor: Secondary | ICD-10-CM | POA: Diagnosis not present

## 2018-05-13 DIAGNOSIS — R4182 Altered mental status, unspecified: Secondary | ICD-10-CM | POA: Diagnosis not present

## 2018-05-13 DIAGNOSIS — I4510 Unspecified right bundle-branch block: Secondary | ICD-10-CM | POA: Diagnosis not present

## 2018-05-14 DIAGNOSIS — S32058A Other fracture of fifth lumbar vertebra, initial encounter for closed fracture: Secondary | ICD-10-CM | POA: Diagnosis not present

## 2018-05-14 DIAGNOSIS — S32048A Other fracture of fourth lumbar vertebra, initial encounter for closed fracture: Secondary | ICD-10-CM | POA: Diagnosis not present

## 2018-05-14 DIAGNOSIS — N179 Acute kidney failure, unspecified: Secondary | ICD-10-CM | POA: Diagnosis not present

## 2018-05-14 DIAGNOSIS — I9589 Other hypotension: Secondary | ICD-10-CM | POA: Diagnosis not present

## 2018-05-14 DIAGNOSIS — I444 Left anterior fascicular block: Secondary | ICD-10-CM | POA: Diagnosis not present

## 2018-05-14 DIAGNOSIS — R69 Illness, unspecified: Secondary | ICD-10-CM | POA: Diagnosis not present

## 2018-05-14 DIAGNOSIS — D72829 Elevated white blood cell count, unspecified: Secondary | ICD-10-CM | POA: Diagnosis not present

## 2018-05-14 DIAGNOSIS — S32029A Unspecified fracture of second lumbar vertebra, initial encounter for closed fracture: Secondary | ICD-10-CM | POA: Diagnosis not present

## 2018-05-14 DIAGNOSIS — E872 Acidosis: Secondary | ICD-10-CM | POA: Diagnosis not present

## 2018-05-14 DIAGNOSIS — E876 Hypokalemia: Secondary | ICD-10-CM | POA: Diagnosis not present

## 2018-05-14 DIAGNOSIS — R231 Pallor: Secondary | ICD-10-CM | POA: Diagnosis not present

## 2018-05-14 DIAGNOSIS — S32018A Other fracture of first lumbar vertebra, initial encounter for closed fracture: Secondary | ICD-10-CM | POA: Diagnosis not present

## 2018-05-14 DIAGNOSIS — R58 Hemorrhage, not elsewhere classified: Secondary | ICD-10-CM | POA: Diagnosis not present

## 2018-05-14 DIAGNOSIS — S32019A Unspecified fracture of first lumbar vertebra, initial encounter for closed fracture: Secondary | ICD-10-CM | POA: Diagnosis not present

## 2018-05-14 DIAGNOSIS — S36898A Other injury of other intra-abdominal organs, initial encounter: Secondary | ICD-10-CM | POA: Diagnosis not present

## 2018-05-14 DIAGNOSIS — I10 Essential (primary) hypertension: Secondary | ICD-10-CM | POA: Diagnosis not present

## 2018-05-14 DIAGNOSIS — S32039A Unspecified fracture of third lumbar vertebra, initial encounter for closed fracture: Secondary | ICD-10-CM | POA: Diagnosis not present

## 2018-05-14 DIAGNOSIS — R001 Bradycardia, unspecified: Secondary | ICD-10-CM | POA: Diagnosis not present

## 2018-05-14 DIAGNOSIS — R4182 Altered mental status, unspecified: Secondary | ICD-10-CM | POA: Diagnosis not present

## 2018-05-14 DIAGNOSIS — R531 Weakness: Secondary | ICD-10-CM | POA: Diagnosis not present

## 2018-05-14 DIAGNOSIS — T794XXA Traumatic shock, initial encounter: Secondary | ICD-10-CM | POA: Diagnosis not present

## 2018-05-14 DIAGNOSIS — W19XXXA Unspecified fall, initial encounter: Secondary | ICD-10-CM | POA: Diagnosis not present

## 2018-05-14 DIAGNOSIS — I252 Old myocardial infarction: Secondary | ICD-10-CM | POA: Diagnosis not present

## 2018-05-14 DIAGNOSIS — S3991XA Unspecified injury of abdomen, initial encounter: Secondary | ICD-10-CM | POA: Diagnosis not present

## 2018-05-14 DIAGNOSIS — S32038A Other fracture of third lumbar vertebra, initial encounter for closed fracture: Secondary | ICD-10-CM | POA: Diagnosis not present

## 2018-05-14 DIAGNOSIS — D62 Acute posthemorrhagic anemia: Secondary | ICD-10-CM | POA: Diagnosis not present

## 2018-05-14 DIAGNOSIS — R578 Other shock: Secondary | ICD-10-CM | POA: Diagnosis not present

## 2018-05-14 DIAGNOSIS — R9431 Abnormal electrocardiogram [ECG] [EKG]: Secondary | ICD-10-CM | POA: Diagnosis not present

## 2018-05-14 DIAGNOSIS — S36899A Unspecified injury of other intra-abdominal organs, initial encounter: Secondary | ICD-10-CM | POA: Diagnosis not present

## 2018-05-14 DIAGNOSIS — I4510 Unspecified right bundle-branch block: Secondary | ICD-10-CM | POA: Diagnosis not present

## 2018-05-14 DIAGNOSIS — Z043 Encounter for examination and observation following other accident: Secondary | ICD-10-CM | POA: Diagnosis not present

## 2018-05-14 DIAGNOSIS — S32059A Unspecified fracture of fifth lumbar vertebra, initial encounter for closed fracture: Secondary | ICD-10-CM | POA: Diagnosis not present

## 2018-05-14 DIAGNOSIS — S32028A Other fracture of second lumbar vertebra, initial encounter for closed fracture: Secondary | ICD-10-CM | POA: Diagnosis not present

## 2018-05-14 DIAGNOSIS — I2510 Atherosclerotic heart disease of native coronary artery without angina pectoris: Secondary | ICD-10-CM | POA: Diagnosis not present

## 2018-05-14 DIAGNOSIS — R61 Generalized hyperhidrosis: Secondary | ICD-10-CM | POA: Diagnosis not present

## 2018-05-14 DIAGNOSIS — S32009A Unspecified fracture of unspecified lumbar vertebra, initial encounter for closed fracture: Secondary | ICD-10-CM | POA: Diagnosis not present

## 2018-05-14 DIAGNOSIS — I959 Hypotension, unspecified: Secondary | ICD-10-CM | POA: Diagnosis not present

## 2018-05-14 DIAGNOSIS — S36892A Contusion of other intra-abdominal organs, initial encounter: Secondary | ICD-10-CM | POA: Diagnosis not present

## 2018-05-14 DIAGNOSIS — S32049A Unspecified fracture of fourth lumbar vertebra, initial encounter for closed fracture: Secondary | ICD-10-CM | POA: Diagnosis not present

## 2018-05-14 DIAGNOSIS — W01198A Fall on same level from slipping, tripping and stumbling with subsequent striking against other object, initial encounter: Secondary | ICD-10-CM | POA: Diagnosis not present

## 2018-05-19 DIAGNOSIS — S32049D Unspecified fracture of fourth lumbar vertebra, subsequent encounter for fracture with routine healing: Secondary | ICD-10-CM | POA: Diagnosis not present

## 2018-05-19 DIAGNOSIS — S32039D Unspecified fracture of third lumbar vertebra, subsequent encounter for fracture with routine healing: Secondary | ICD-10-CM | POA: Diagnosis not present

## 2018-05-19 DIAGNOSIS — S32059D Unspecified fracture of fifth lumbar vertebra, subsequent encounter for fracture with routine healing: Secondary | ICD-10-CM | POA: Diagnosis not present

## 2018-05-19 DIAGNOSIS — R413 Other amnesia: Secondary | ICD-10-CM | POA: Diagnosis not present

## 2018-05-19 DIAGNOSIS — S32029D Unspecified fracture of second lumbar vertebra, subsequent encounter for fracture with routine healing: Secondary | ICD-10-CM | POA: Diagnosis not present

## 2018-05-19 DIAGNOSIS — S32019D Unspecified fracture of first lumbar vertebra, subsequent encounter for fracture with routine healing: Secondary | ICD-10-CM | POA: Diagnosis not present

## 2018-05-19 DIAGNOSIS — Z9181 History of falling: Secondary | ICD-10-CM | POA: Diagnosis not present

## 2018-05-19 DIAGNOSIS — K661 Hemoperitoneum: Secondary | ICD-10-CM | POA: Diagnosis not present

## 2018-05-22 DIAGNOSIS — S32059D Unspecified fracture of fifth lumbar vertebra, subsequent encounter for fracture with routine healing: Secondary | ICD-10-CM | POA: Diagnosis not present

## 2018-05-22 DIAGNOSIS — Z9181 History of falling: Secondary | ICD-10-CM | POA: Diagnosis not present

## 2018-05-22 DIAGNOSIS — S32019D Unspecified fracture of first lumbar vertebra, subsequent encounter for fracture with routine healing: Secondary | ICD-10-CM | POA: Diagnosis not present

## 2018-05-22 DIAGNOSIS — R413 Other amnesia: Secondary | ICD-10-CM | POA: Diagnosis not present

## 2018-05-22 DIAGNOSIS — K661 Hemoperitoneum: Secondary | ICD-10-CM | POA: Diagnosis not present

## 2018-05-22 DIAGNOSIS — S32049D Unspecified fracture of fourth lumbar vertebra, subsequent encounter for fracture with routine healing: Secondary | ICD-10-CM | POA: Diagnosis not present

## 2018-05-22 DIAGNOSIS — S32039D Unspecified fracture of third lumbar vertebra, subsequent encounter for fracture with routine healing: Secondary | ICD-10-CM | POA: Diagnosis not present

## 2018-05-22 DIAGNOSIS — S32029D Unspecified fracture of second lumbar vertebra, subsequent encounter for fracture with routine healing: Secondary | ICD-10-CM | POA: Diagnosis not present

## 2018-05-26 DIAGNOSIS — R310 Gross hematuria: Secondary | ICD-10-CM | POA: Diagnosis not present

## 2018-05-26 DIAGNOSIS — N3000 Acute cystitis without hematuria: Secondary | ICD-10-CM | POA: Diagnosis not present

## 2018-05-29 DIAGNOSIS — N400 Enlarged prostate without lower urinary tract symptoms: Secondary | ICD-10-CM | POA: Diagnosis not present

## 2018-05-29 DIAGNOSIS — I10 Essential (primary) hypertension: Secondary | ICD-10-CM | POA: Diagnosis not present

## 2018-05-29 DIAGNOSIS — E039 Hypothyroidism, unspecified: Secondary | ICD-10-CM | POA: Diagnosis not present

## 2018-05-30 DIAGNOSIS — S32049D Unspecified fracture of fourth lumbar vertebra, subsequent encounter for fracture with routine healing: Secondary | ICD-10-CM | POA: Diagnosis not present

## 2018-05-30 DIAGNOSIS — K661 Hemoperitoneum: Secondary | ICD-10-CM | POA: Diagnosis not present

## 2018-05-30 DIAGNOSIS — R413 Other amnesia: Secondary | ICD-10-CM | POA: Diagnosis not present

## 2018-05-30 DIAGNOSIS — S32039D Unspecified fracture of third lumbar vertebra, subsequent encounter for fracture with routine healing: Secondary | ICD-10-CM | POA: Diagnosis not present

## 2018-05-30 DIAGNOSIS — S32059D Unspecified fracture of fifth lumbar vertebra, subsequent encounter for fracture with routine healing: Secondary | ICD-10-CM | POA: Diagnosis not present

## 2018-05-30 DIAGNOSIS — S32029D Unspecified fracture of second lumbar vertebra, subsequent encounter for fracture with routine healing: Secondary | ICD-10-CM | POA: Diagnosis not present

## 2018-05-30 DIAGNOSIS — Z9181 History of falling: Secondary | ICD-10-CM | POA: Diagnosis not present

## 2018-05-30 DIAGNOSIS — S32019D Unspecified fracture of first lumbar vertebra, subsequent encounter for fracture with routine healing: Secondary | ICD-10-CM | POA: Diagnosis not present

## 2018-06-12 DIAGNOSIS — K661 Hemoperitoneum: Secondary | ICD-10-CM | POA: Diagnosis not present

## 2018-06-12 DIAGNOSIS — S32039D Unspecified fracture of third lumbar vertebra, subsequent encounter for fracture with routine healing: Secondary | ICD-10-CM | POA: Diagnosis not present

## 2018-06-12 DIAGNOSIS — R413 Other amnesia: Secondary | ICD-10-CM | POA: Diagnosis not present

## 2018-06-12 DIAGNOSIS — S32059D Unspecified fracture of fifth lumbar vertebra, subsequent encounter for fracture with routine healing: Secondary | ICD-10-CM | POA: Diagnosis not present

## 2018-06-12 DIAGNOSIS — S32029D Unspecified fracture of second lumbar vertebra, subsequent encounter for fracture with routine healing: Secondary | ICD-10-CM | POA: Diagnosis not present

## 2018-06-12 DIAGNOSIS — Z9181 History of falling: Secondary | ICD-10-CM | POA: Diagnosis not present

## 2018-06-12 DIAGNOSIS — S32049D Unspecified fracture of fourth lumbar vertebra, subsequent encounter for fracture with routine healing: Secondary | ICD-10-CM | POA: Diagnosis not present

## 2018-06-12 DIAGNOSIS — S32019D Unspecified fracture of first lumbar vertebra, subsequent encounter for fracture with routine healing: Secondary | ICD-10-CM | POA: Diagnosis not present

## 2018-06-14 DIAGNOSIS — S32019D Unspecified fracture of first lumbar vertebra, subsequent encounter for fracture with routine healing: Secondary | ICD-10-CM | POA: Diagnosis not present

## 2018-06-14 DIAGNOSIS — S32039D Unspecified fracture of third lumbar vertebra, subsequent encounter for fracture with routine healing: Secondary | ICD-10-CM | POA: Diagnosis not present

## 2018-06-14 DIAGNOSIS — S32059D Unspecified fracture of fifth lumbar vertebra, subsequent encounter for fracture with routine healing: Secondary | ICD-10-CM | POA: Diagnosis not present

## 2018-06-14 DIAGNOSIS — S32029D Unspecified fracture of second lumbar vertebra, subsequent encounter for fracture with routine healing: Secondary | ICD-10-CM | POA: Diagnosis not present

## 2018-06-14 DIAGNOSIS — S32049D Unspecified fracture of fourth lumbar vertebra, subsequent encounter for fracture with routine healing: Secondary | ICD-10-CM | POA: Diagnosis not present

## 2018-06-14 DIAGNOSIS — K661 Hemoperitoneum: Secondary | ICD-10-CM | POA: Diagnosis not present

## 2018-06-14 DIAGNOSIS — R413 Other amnesia: Secondary | ICD-10-CM | POA: Diagnosis not present

## 2018-06-14 DIAGNOSIS — Z9181 History of falling: Secondary | ICD-10-CM | POA: Diagnosis not present

## 2018-06-16 DIAGNOSIS — S32019D Unspecified fracture of first lumbar vertebra, subsequent encounter for fracture with routine healing: Secondary | ICD-10-CM | POA: Diagnosis not present

## 2018-06-16 DIAGNOSIS — S32059D Unspecified fracture of fifth lumbar vertebra, subsequent encounter for fracture with routine healing: Secondary | ICD-10-CM | POA: Diagnosis not present

## 2018-06-16 DIAGNOSIS — S32029D Unspecified fracture of second lumbar vertebra, subsequent encounter for fracture with routine healing: Secondary | ICD-10-CM | POA: Diagnosis not present

## 2018-06-16 DIAGNOSIS — R413 Other amnesia: Secondary | ICD-10-CM | POA: Diagnosis not present

## 2018-06-16 DIAGNOSIS — Z9181 History of falling: Secondary | ICD-10-CM | POA: Diagnosis not present

## 2018-06-16 DIAGNOSIS — S32039D Unspecified fracture of third lumbar vertebra, subsequent encounter for fracture with routine healing: Secondary | ICD-10-CM | POA: Diagnosis not present

## 2018-06-16 DIAGNOSIS — K661 Hemoperitoneum: Secondary | ICD-10-CM | POA: Diagnosis not present

## 2018-06-16 DIAGNOSIS — S32049D Unspecified fracture of fourth lumbar vertebra, subsequent encounter for fracture with routine healing: Secondary | ICD-10-CM | POA: Diagnosis not present

## 2018-06-18 DIAGNOSIS — S32049D Unspecified fracture of fourth lumbar vertebra, subsequent encounter for fracture with routine healing: Secondary | ICD-10-CM | POA: Diagnosis not present

## 2018-06-18 DIAGNOSIS — K661 Hemoperitoneum: Secondary | ICD-10-CM | POA: Diagnosis not present

## 2018-06-18 DIAGNOSIS — Z9181 History of falling: Secondary | ICD-10-CM | POA: Diagnosis not present

## 2018-06-18 DIAGNOSIS — S32029D Unspecified fracture of second lumbar vertebra, subsequent encounter for fracture with routine healing: Secondary | ICD-10-CM | POA: Diagnosis not present

## 2018-06-18 DIAGNOSIS — S32039D Unspecified fracture of third lumbar vertebra, subsequent encounter for fracture with routine healing: Secondary | ICD-10-CM | POA: Diagnosis not present

## 2018-06-18 DIAGNOSIS — S32019D Unspecified fracture of first lumbar vertebra, subsequent encounter for fracture with routine healing: Secondary | ICD-10-CM | POA: Diagnosis not present

## 2018-06-18 DIAGNOSIS — R413 Other amnesia: Secondary | ICD-10-CM | POA: Diagnosis not present

## 2018-06-18 DIAGNOSIS — S32059D Unspecified fracture of fifth lumbar vertebra, subsequent encounter for fracture with routine healing: Secondary | ICD-10-CM | POA: Diagnosis not present

## 2018-06-21 DIAGNOSIS — S32049D Unspecified fracture of fourth lumbar vertebra, subsequent encounter for fracture with routine healing: Secondary | ICD-10-CM | POA: Diagnosis not present

## 2018-06-21 DIAGNOSIS — K661 Hemoperitoneum: Secondary | ICD-10-CM | POA: Diagnosis not present

## 2018-06-21 DIAGNOSIS — R413 Other amnesia: Secondary | ICD-10-CM | POA: Diagnosis not present

## 2018-06-21 DIAGNOSIS — S32019D Unspecified fracture of first lumbar vertebra, subsequent encounter for fracture with routine healing: Secondary | ICD-10-CM | POA: Diagnosis not present

## 2018-06-21 DIAGNOSIS — S32029D Unspecified fracture of second lumbar vertebra, subsequent encounter for fracture with routine healing: Secondary | ICD-10-CM | POA: Diagnosis not present

## 2018-06-21 DIAGNOSIS — Z9181 History of falling: Secondary | ICD-10-CM | POA: Diagnosis not present

## 2018-06-21 DIAGNOSIS — S32059D Unspecified fracture of fifth lumbar vertebra, subsequent encounter for fracture with routine healing: Secondary | ICD-10-CM | POA: Diagnosis not present

## 2018-06-21 DIAGNOSIS — S32039D Unspecified fracture of third lumbar vertebra, subsequent encounter for fracture with routine healing: Secondary | ICD-10-CM | POA: Diagnosis not present

## 2018-06-27 DIAGNOSIS — L821 Other seborrheic keratosis: Secondary | ICD-10-CM | POA: Diagnosis not present

## 2018-06-27 DIAGNOSIS — Z85828 Personal history of other malignant neoplasm of skin: Secondary | ICD-10-CM | POA: Diagnosis not present

## 2018-06-27 DIAGNOSIS — D485 Neoplasm of uncertain behavior of skin: Secondary | ICD-10-CM | POA: Diagnosis not present

## 2018-06-27 DIAGNOSIS — D225 Melanocytic nevi of trunk: Secondary | ICD-10-CM | POA: Diagnosis not present

## 2018-06-27 DIAGNOSIS — L570 Actinic keratosis: Secondary | ICD-10-CM | POA: Diagnosis not present

## 2018-06-29 DIAGNOSIS — R413 Other amnesia: Secondary | ICD-10-CM | POA: Diagnosis not present

## 2018-06-29 DIAGNOSIS — S32039D Unspecified fracture of third lumbar vertebra, subsequent encounter for fracture with routine healing: Secondary | ICD-10-CM | POA: Diagnosis not present

## 2018-06-29 DIAGNOSIS — S32029D Unspecified fracture of second lumbar vertebra, subsequent encounter for fracture with routine healing: Secondary | ICD-10-CM | POA: Diagnosis not present

## 2018-06-29 DIAGNOSIS — S32019D Unspecified fracture of first lumbar vertebra, subsequent encounter for fracture with routine healing: Secondary | ICD-10-CM | POA: Diagnosis not present

## 2018-06-29 DIAGNOSIS — S32059D Unspecified fracture of fifth lumbar vertebra, subsequent encounter for fracture with routine healing: Secondary | ICD-10-CM | POA: Diagnosis not present

## 2018-06-29 DIAGNOSIS — K661 Hemoperitoneum: Secondary | ICD-10-CM | POA: Diagnosis not present

## 2018-06-29 DIAGNOSIS — S32049D Unspecified fracture of fourth lumbar vertebra, subsequent encounter for fracture with routine healing: Secondary | ICD-10-CM | POA: Diagnosis not present

## 2018-06-29 DIAGNOSIS — Z9181 History of falling: Secondary | ICD-10-CM | POA: Diagnosis not present

## 2018-07-06 DIAGNOSIS — R310 Gross hematuria: Secondary | ICD-10-CM | POA: Diagnosis not present

## 2018-07-06 DIAGNOSIS — R3915 Urgency of urination: Secondary | ICD-10-CM | POA: Diagnosis not present

## 2018-07-11 DIAGNOSIS — S32049D Unspecified fracture of fourth lumbar vertebra, subsequent encounter for fracture with routine healing: Secondary | ICD-10-CM | POA: Diagnosis not present

## 2018-07-11 DIAGNOSIS — Z9181 History of falling: Secondary | ICD-10-CM | POA: Diagnosis not present

## 2018-07-11 DIAGNOSIS — S32029D Unspecified fracture of second lumbar vertebra, subsequent encounter for fracture with routine healing: Secondary | ICD-10-CM | POA: Diagnosis not present

## 2018-07-11 DIAGNOSIS — K661 Hemoperitoneum: Secondary | ICD-10-CM | POA: Diagnosis not present

## 2018-07-11 DIAGNOSIS — S32039D Unspecified fracture of third lumbar vertebra, subsequent encounter for fracture with routine healing: Secondary | ICD-10-CM | POA: Diagnosis not present

## 2018-07-11 DIAGNOSIS — R413 Other amnesia: Secondary | ICD-10-CM | POA: Diagnosis not present

## 2018-07-11 DIAGNOSIS — S32059D Unspecified fracture of fifth lumbar vertebra, subsequent encounter for fracture with routine healing: Secondary | ICD-10-CM | POA: Diagnosis not present

## 2018-07-11 DIAGNOSIS — S32019D Unspecified fracture of first lumbar vertebra, subsequent encounter for fracture with routine healing: Secondary | ICD-10-CM | POA: Diagnosis not present

## 2018-07-29 DIAGNOSIS — E039 Hypothyroidism, unspecified: Secondary | ICD-10-CM | POA: Diagnosis not present

## 2018-07-29 DIAGNOSIS — I10 Essential (primary) hypertension: Secondary | ICD-10-CM | POA: Diagnosis not present

## 2018-08-29 DIAGNOSIS — E039 Hypothyroidism, unspecified: Secondary | ICD-10-CM | POA: Diagnosis not present

## 2018-08-29 DIAGNOSIS — G4700 Insomnia, unspecified: Secondary | ICD-10-CM | POA: Diagnosis not present

## 2018-08-29 DIAGNOSIS — I10 Essential (primary) hypertension: Secondary | ICD-10-CM | POA: Diagnosis not present

## 2018-10-30 DIAGNOSIS — R69 Illness, unspecified: Secondary | ICD-10-CM | POA: Diagnosis not present

## 2018-10-30 DIAGNOSIS — E1165 Type 2 diabetes mellitus with hyperglycemia: Secondary | ICD-10-CM | POA: Diagnosis not present

## 2018-10-30 DIAGNOSIS — I10 Essential (primary) hypertension: Secondary | ICD-10-CM | POA: Diagnosis not present

## 2018-11-24 DIAGNOSIS — I10 Essential (primary) hypertension: Secondary | ICD-10-CM | POA: Diagnosis not present

## 2018-11-28 DIAGNOSIS — R69 Illness, unspecified: Secondary | ICD-10-CM | POA: Diagnosis not present

## 2018-11-29 DIAGNOSIS — E039 Hypothyroidism, unspecified: Secondary | ICD-10-CM | POA: Diagnosis not present

## 2018-11-29 DIAGNOSIS — I10 Essential (primary) hypertension: Secondary | ICD-10-CM | POA: Diagnosis not present

## 2018-11-30 DIAGNOSIS — H401133 Primary open-angle glaucoma, bilateral, severe stage: Secondary | ICD-10-CM | POA: Diagnosis not present

## 2018-11-30 DIAGNOSIS — H04123 Dry eye syndrome of bilateral lacrimal glands: Secondary | ICD-10-CM | POA: Diagnosis not present

## 2018-11-30 DIAGNOSIS — Z961 Presence of intraocular lens: Secondary | ICD-10-CM | POA: Diagnosis not present

## 2018-11-30 DIAGNOSIS — H2640 Unspecified secondary cataract: Secondary | ICD-10-CM | POA: Diagnosis not present

## 2018-12-27 DIAGNOSIS — I10 Essential (primary) hypertension: Secondary | ICD-10-CM | POA: Diagnosis not present

## 2018-12-27 DIAGNOSIS — L570 Actinic keratosis: Secondary | ICD-10-CM | POA: Diagnosis not present

## 2018-12-27 DIAGNOSIS — Z85828 Personal history of other malignant neoplasm of skin: Secondary | ICD-10-CM | POA: Diagnosis not present

## 2018-12-27 DIAGNOSIS — L821 Other seborrheic keratosis: Secondary | ICD-10-CM | POA: Diagnosis not present

## 2018-12-29 DIAGNOSIS — I10 Essential (primary) hypertension: Secondary | ICD-10-CM | POA: Diagnosis not present

## 2018-12-29 DIAGNOSIS — E782 Mixed hyperlipidemia: Secondary | ICD-10-CM | POA: Diagnosis not present

## 2019-01-04 DIAGNOSIS — I10 Essential (primary) hypertension: Secondary | ICD-10-CM | POA: Diagnosis not present

## 2019-01-04 DIAGNOSIS — N179 Acute kidney failure, unspecified: Secondary | ICD-10-CM | POA: Diagnosis not present

## 2019-01-04 DIAGNOSIS — N182 Chronic kidney disease, stage 2 (mild): Secondary | ICD-10-CM | POA: Diagnosis not present

## 2019-01-04 DIAGNOSIS — E039 Hypothyroidism, unspecified: Secondary | ICD-10-CM | POA: Diagnosis not present

## 2019-01-09 DIAGNOSIS — R3915 Urgency of urination: Secondary | ICD-10-CM | POA: Diagnosis not present

## 2019-01-09 DIAGNOSIS — R310 Gross hematuria: Secondary | ICD-10-CM | POA: Diagnosis not present

## 2019-01-17 DIAGNOSIS — R001 Bradycardia, unspecified: Secondary | ICD-10-CM | POA: Diagnosis not present

## 2019-01-17 DIAGNOSIS — Z96652 Presence of left artificial knee joint: Secondary | ICD-10-CM | POA: Diagnosis not present

## 2019-01-17 DIAGNOSIS — N401 Enlarged prostate with lower urinary tract symptoms: Secondary | ICD-10-CM | POA: Diagnosis not present

## 2019-01-17 DIAGNOSIS — Z6833 Body mass index (BMI) 33.0-33.9, adult: Secondary | ICD-10-CM | POA: Diagnosis not present

## 2019-01-17 DIAGNOSIS — R972 Elevated prostate specific antigen [PSA]: Secondary | ICD-10-CM | POA: Diagnosis not present

## 2019-01-17 DIAGNOSIS — I10 Essential (primary) hypertension: Secondary | ICD-10-CM | POA: Diagnosis not present

## 2019-01-17 DIAGNOSIS — N323 Diverticulum of bladder: Secondary | ICD-10-CM | POA: Diagnosis not present

## 2019-01-17 DIAGNOSIS — Z0001 Encounter for general adult medical examination with abnormal findings: Secondary | ICD-10-CM | POA: Diagnosis not present

## 2019-01-26 DIAGNOSIS — I10 Essential (primary) hypertension: Secondary | ICD-10-CM | POA: Diagnosis not present

## 2019-01-29 DIAGNOSIS — E872 Acidosis: Secondary | ICD-10-CM | POA: Diagnosis not present

## 2019-01-29 DIAGNOSIS — I10 Essential (primary) hypertension: Secondary | ICD-10-CM | POA: Diagnosis not present

## 2019-02-28 DIAGNOSIS — N401 Enlarged prostate with lower urinary tract symptoms: Secondary | ICD-10-CM | POA: Diagnosis not present

## 2019-02-28 DIAGNOSIS — Z6835 Body mass index (BMI) 35.0-35.9, adult: Secondary | ICD-10-CM | POA: Diagnosis not present

## 2019-02-28 DIAGNOSIS — I10 Essential (primary) hypertension: Secondary | ICD-10-CM | POA: Diagnosis not present

## 2019-02-28 DIAGNOSIS — N182 Chronic kidney disease, stage 2 (mild): Secondary | ICD-10-CM | POA: Diagnosis not present

## 2019-02-28 DIAGNOSIS — R972 Elevated prostate specific antigen [PSA]: Secondary | ICD-10-CM | POA: Diagnosis not present

## 2019-02-28 DIAGNOSIS — G309 Alzheimer's disease, unspecified: Secondary | ICD-10-CM | POA: Diagnosis not present

## 2019-02-28 DIAGNOSIS — R001 Bradycardia, unspecified: Secondary | ICD-10-CM | POA: Diagnosis not present

## 2019-02-28 DIAGNOSIS — N323 Diverticulum of bladder: Secondary | ICD-10-CM | POA: Diagnosis not present

## 2019-03-01 DIAGNOSIS — I10 Essential (primary) hypertension: Secondary | ICD-10-CM | POA: Diagnosis not present

## 2019-03-15 DIAGNOSIS — H353221 Exudative age-related macular degeneration, left eye, with active choroidal neovascularization: Secondary | ICD-10-CM | POA: Diagnosis not present

## 2019-03-15 DIAGNOSIS — H353114 Nonexudative age-related macular degeneration, right eye, advanced atrophic with subfoveal involvement: Secondary | ICD-10-CM | POA: Diagnosis not present

## 2019-03-15 DIAGNOSIS — H35033 Hypertensive retinopathy, bilateral: Secondary | ICD-10-CM | POA: Diagnosis not present

## 2019-03-15 DIAGNOSIS — H401133 Primary open-angle glaucoma, bilateral, severe stage: Secondary | ICD-10-CM | POA: Diagnosis not present

## 2019-03-22 DIAGNOSIS — N3000 Acute cystitis without hematuria: Secondary | ICD-10-CM | POA: Diagnosis not present

## 2019-03-22 DIAGNOSIS — R310 Gross hematuria: Secondary | ICD-10-CM | POA: Diagnosis not present

## 2019-04-18 DIAGNOSIS — I10 Essential (primary) hypertension: Secondary | ICD-10-CM | POA: Diagnosis not present

## 2019-04-18 DIAGNOSIS — N182 Chronic kidney disease, stage 2 (mild): Secondary | ICD-10-CM | POA: Diagnosis not present

## 2019-04-18 DIAGNOSIS — E782 Mixed hyperlipidemia: Secondary | ICD-10-CM | POA: Diagnosis not present

## 2019-04-25 DIAGNOSIS — I10 Essential (primary) hypertension: Secondary | ICD-10-CM | POA: Diagnosis not present

## 2019-04-25 DIAGNOSIS — N182 Chronic kidney disease, stage 2 (mild): Secondary | ICD-10-CM | POA: Diagnosis not present

## 2019-04-25 DIAGNOSIS — R32 Unspecified urinary incontinence: Secondary | ICD-10-CM | POA: Diagnosis not present

## 2019-04-25 DIAGNOSIS — R319 Hematuria, unspecified: Secondary | ICD-10-CM | POA: Diagnosis not present

## 2019-04-25 DIAGNOSIS — Z6833 Body mass index (BMI) 33.0-33.9, adult: Secondary | ICD-10-CM | POA: Diagnosis not present

## 2019-04-25 DIAGNOSIS — R001 Bradycardia, unspecified: Secondary | ICD-10-CM | POA: Diagnosis not present

## 2019-04-25 DIAGNOSIS — N323 Diverticulum of bladder: Secondary | ICD-10-CM | POA: Diagnosis not present

## 2019-04-25 DIAGNOSIS — G309 Alzheimer's disease, unspecified: Secondary | ICD-10-CM | POA: Diagnosis not present

## 2019-04-26 DIAGNOSIS — E7849 Other hyperlipidemia: Secondary | ICD-10-CM | POA: Diagnosis not present

## 2019-04-26 DIAGNOSIS — I10 Essential (primary) hypertension: Secondary | ICD-10-CM | POA: Diagnosis not present

## 2019-04-27 DIAGNOSIS — I10 Essential (primary) hypertension: Secondary | ICD-10-CM | POA: Diagnosis not present

## 2019-04-27 DIAGNOSIS — E7849 Other hyperlipidemia: Secondary | ICD-10-CM | POA: Diagnosis not present

## 2019-05-29 DIAGNOSIS — E7849 Other hyperlipidemia: Secondary | ICD-10-CM | POA: Diagnosis not present

## 2019-05-29 DIAGNOSIS — I10 Essential (primary) hypertension: Secondary | ICD-10-CM | POA: Diagnosis not present

## 2019-05-30 DIAGNOSIS — I10 Essential (primary) hypertension: Secondary | ICD-10-CM | POA: Diagnosis not present

## 2019-05-30 DIAGNOSIS — E7849 Other hyperlipidemia: Secondary | ICD-10-CM | POA: Diagnosis not present

## 2019-06-28 DIAGNOSIS — E7849 Other hyperlipidemia: Secondary | ICD-10-CM | POA: Diagnosis not present

## 2019-06-28 DIAGNOSIS — I10 Essential (primary) hypertension: Secondary | ICD-10-CM | POA: Diagnosis not present

## 2019-06-29 DIAGNOSIS — I10 Essential (primary) hypertension: Secondary | ICD-10-CM | POA: Diagnosis not present

## 2019-06-29 DIAGNOSIS — E7849 Other hyperlipidemia: Secondary | ICD-10-CM | POA: Diagnosis not present

## 2019-07-19 DIAGNOSIS — N182 Chronic kidney disease, stage 2 (mild): Secondary | ICD-10-CM | POA: Diagnosis not present

## 2019-07-19 DIAGNOSIS — G309 Alzheimer's disease, unspecified: Secondary | ICD-10-CM | POA: Diagnosis not present

## 2019-07-19 DIAGNOSIS — I10 Essential (primary) hypertension: Secondary | ICD-10-CM | POA: Diagnosis not present

## 2019-07-19 DIAGNOSIS — E039 Hypothyroidism, unspecified: Secondary | ICD-10-CM | POA: Diagnosis not present

## 2019-07-23 DIAGNOSIS — I10 Essential (primary) hypertension: Secondary | ICD-10-CM | POA: Diagnosis not present

## 2019-07-23 DIAGNOSIS — Z6833 Body mass index (BMI) 33.0-33.9, adult: Secondary | ICD-10-CM | POA: Diagnosis not present

## 2019-07-23 DIAGNOSIS — R319 Hematuria, unspecified: Secondary | ICD-10-CM | POA: Diagnosis not present

## 2019-07-23 DIAGNOSIS — N182 Chronic kidney disease, stage 2 (mild): Secondary | ICD-10-CM | POA: Diagnosis not present

## 2019-07-23 DIAGNOSIS — N323 Diverticulum of bladder: Secondary | ICD-10-CM | POA: Diagnosis not present

## 2019-07-23 DIAGNOSIS — R32 Unspecified urinary incontinence: Secondary | ICD-10-CM | POA: Diagnosis not present

## 2019-07-23 DIAGNOSIS — N401 Enlarged prostate with lower urinary tract symptoms: Secondary | ICD-10-CM | POA: Diagnosis not present

## 2019-07-23 DIAGNOSIS — G309 Alzheimer's disease, unspecified: Secondary | ICD-10-CM | POA: Diagnosis not present

## 2019-07-30 DIAGNOSIS — E039 Hypothyroidism, unspecified: Secondary | ICD-10-CM | POA: Diagnosis not present

## 2019-07-30 DIAGNOSIS — I10 Essential (primary) hypertension: Secondary | ICD-10-CM | POA: Diagnosis not present

## 2019-08-29 DIAGNOSIS — N183 Chronic kidney disease, stage 3 unspecified: Secondary | ICD-10-CM | POA: Diagnosis not present

## 2019-08-29 DIAGNOSIS — I129 Hypertensive chronic kidney disease with stage 1 through stage 4 chronic kidney disease, or unspecified chronic kidney disease: Secondary | ICD-10-CM | POA: Diagnosis not present

## 2019-08-29 DIAGNOSIS — R69 Illness, unspecified: Secondary | ICD-10-CM | POA: Diagnosis not present

## 2019-08-29 DIAGNOSIS — G309 Alzheimer's disease, unspecified: Secondary | ICD-10-CM | POA: Diagnosis not present

## 2019-09-18 DIAGNOSIS — H353221 Exudative age-related macular degeneration, left eye, with active choroidal neovascularization: Secondary | ICD-10-CM | POA: Diagnosis not present

## 2019-09-18 DIAGNOSIS — H401133 Primary open-angle glaucoma, bilateral, severe stage: Secondary | ICD-10-CM | POA: Diagnosis not present

## 2019-09-18 DIAGNOSIS — H353211 Exudative age-related macular degeneration, right eye, with active choroidal neovascularization: Secondary | ICD-10-CM | POA: Diagnosis not present

## 2019-09-18 DIAGNOSIS — Z961 Presence of intraocular lens: Secondary | ICD-10-CM | POA: Diagnosis not present

## 2019-09-19 ENCOUNTER — Other Ambulatory Visit: Payer: Self-pay

## 2019-09-19 ENCOUNTER — Ambulatory Visit (INDEPENDENT_AMBULATORY_CARE_PROVIDER_SITE_OTHER): Payer: Medicare HMO | Admitting: Ophthalmology

## 2019-09-19 ENCOUNTER — Encounter (INDEPENDENT_AMBULATORY_CARE_PROVIDER_SITE_OTHER): Payer: Self-pay | Admitting: Ophthalmology

## 2019-09-19 DIAGNOSIS — H353211 Exudative age-related macular degeneration, right eye, with active choroidal neovascularization: Secondary | ICD-10-CM

## 2019-09-19 DIAGNOSIS — H401123 Primary open-angle glaucoma, left eye, severe stage: Secondary | ICD-10-CM | POA: Diagnosis not present

## 2019-09-19 MED ORDER — BEVACIZUMAB CHEMO INJECTION 1.25MG/0.05ML SYRINGE FOR KALEIDOSCOPE
1.2500 mg | INTRAVITREAL | Status: AC | PRN
  Administered 2019-09-19: 1.25 mg via INTRAVITREAL

## 2019-09-19 NOTE — Assessment & Plan Note (Signed)
Commence with intravitreal Avastin therapy to halt growth of CNVM OD, goal is to prevent further vision loss and perhaps regain some

## 2019-09-19 NOTE — Progress Notes (Signed)
09/19/2019     CHIEF COMPLAINT Patient presents for Retina Evaluation   HISTORY OF PRESENT ILLNESS: Kurt Iles Bakula Sr. is a 86 y.o. male who presents to the clinic today for:   HPI    Retina Evaluation    In right eye.  Associated Symptoms Negative for Floaters and Flashes.  Context:  distance vision.  Treatments tried include no treatments.          Comments    Referred by Kathlen Mody - Subretinal fluid OD **Patient has dementia and currently has no complaints Patients wife states that she has not heard any complaints about the vision in OD as he has poor vision in OS. Patients wife states that last week he was rubbing over his eyebrow complaining of a headache.       Last edited by Gerda Diss on 09/19/2019  1:52 PM. (History)      Referring physician: Curlene Labrum, MD [place],  [place] [address]  HISTORICAL INFORMATION:   Selected notes from the MEDICAL RECORD NUMBER       CURRENT MEDICATIONS: Current Outpatient Medications (Ophthalmic Drugs)  Medication Sig  . brimonidine (ALPHAGAN) 0.15 % ophthalmic solution Place 1 drop into the left eye 2 (two) times daily.  Marland Kitchen latanoprost (XALATAN) 0.005 % ophthalmic solution Place 1 drop into both eyes at bedtime.    No current facility-administered medications for this visit. (Ophthalmic Drugs)   Current Outpatient Medications (Other)  Medication Sig  . atenolol-chlorthalidone (TENORETIC) 100-25 MG per tablet Take 1 tablet by mouth daily.   . diphenhydrAMINE (BENADRYL) 25 MG tablet Take 50 mg by mouth at bedtime.   Marland Kitchen KLOR-CON M10 10 MEQ tablet TAKE 1 TABLET BY MOUTH ONCE DAILY  . levothyroxine (SYNTHROID, LEVOTHROID) 25 MCG tablet Take 25 mcg by mouth daily before breakfast.   . mirabegron ER (MYRBETRIQ) 25 MG TB24 tablet Take 25 mg by mouth daily.  . Multiple Vitamins-Minerals (OCUVITE EXTRA PO) Take 1 tablet by mouth daily.  . Probiotic CAPS Take 1 capsule by mouth daily.   No current facility-administered  medications for this visit. (Other)      REVIEW OF SYSTEMS:    ALLERGIES No Known Allergies  PAST MEDICAL HISTORY Past Medical History:  Diagnosis Date  . BPH (benign prostatic hypertrophy)   . Coronary artery disease cardiologist-  [name] (previously dr Domenic Polite)   abnormal stress test 11-07-2015 ; s/p  cardiac cath 11-27-2015 , non-obstructive cad and normal LVF (pLAD to mLAD 20%; mCx 20%)  . Dementia ([place])    per wife he has dementia but has not been dx by a md  . Essential hypertension, benign   . Glaucoma, both eyes   . Hypothyroidism   . Loose bowel movements   . Lower urinary tract symptoms (LUTS)   . Mixed hyperlipidemia   . OA (osteoarthritis)   . Personal history of MI (myocardial infarction)    per stress test 11-07-2015 large fixed inferior defect consistent w/ old infarction  . PVC's (premature ventricular contractions)   . RBBB (right bundle branch block)   . Wears glasses    Past Surgical History:  Procedure Laterality Date  . APPENDECTOMY  1948  . CARDIAC CATHETERIZATION N/A 11/27/2015   Procedure: Left Heart Cath and Coronary Angiography;  Surgeon: Burnell Blanks, MD;  Location: [facility];  Service: Cardiovascular;  Laterality: N/A;   MILD NON-OBSTRUCTIVE CAD, NORMAL LVSF, EF 55-65%  . CARDIAC CATHETERIZATION  08-23-2007  dr cooper   essentially normal coronary arteries w/ no obstructive cad, normal lvf, ef 65%  . CARDIOVASCULAR STRESS TEST  11-07-2015   [facility], [place]   Intermediate risk nuclear study w/ large fixed inferior defect noted consistent with old infarction, mild reversibility in the inferior wall concerning for ischemia/  normal LV function and wall motion , ef 57%  . CATARACT EXTRACTION W/ INTRAOCULAR LENS  IMPLANT, BILATERAL  2015  approx.  . CYSTOSCOPY WITH BIOPSY N/A 05/02/2017   Procedure: CYSTOSCOPY WITH BLADDER BIOPSY AND FULGURATION;  Surgeon: Cleon Gustin, MD;  Location: [facility];  Service:  Urology;  Laterality: N/A;  . CYSTOSCOPY WITH INSERTION OF UROLIFT N/A 01/31/2017   Procedure: CYSTOSCOPY WITH INSERTION OF UROLIFT;  Surgeon: Cleon Gustin, MD;  Location: [facility];  Service: Urology;  Laterality: N/A;  . CYSTOSCOPY WITH INSERTION OF UROLIFT N/A 05/02/2017   Procedure: CYSTOSCOPY WITH INSERTION OF UROLIFT;  Surgeon: Cleon Gustin, MD;  Location: [facility];  Service: Urology;  Laterality: N/A;  . ERCP N/A 04/04/2017   Procedure: ENDOSCOPIC RETROGRADE CHOLANGIOPANCREATOGRAPHY (ERCP);  Surgeon: Rogene Houston, MD;  Location: [facility];  Service: Endoscopy;  Laterality: N/A;  . KNEE ARTHROPLASTY Left 02/19/2016   Procedure: LEFT TOTAL KNEE ARTHROPLASTY WITH COMPUTER NAVIGATION;  Surgeon: Rod Can, MD;  Location: [facility];  Service: Orthopedics;  Laterality: Left;  . STENT REMOVAL  04/04/2017   Procedure: BILIARY STENT REMOVAL;  Surgeon: Rogene Houston, MD;  Location: [facility];  Service: Endoscopy;;  . TONSILLECTOMY  child    FAMILY HISTORY Family History  Problem Relation Age of Onset  . Colon cancer Mother   . Other Father        MVA    SOCIAL HISTORY Social History   Tobacco Use  . Smoking status: Former Smoker    Packs/day: 1.00    Years: 30.00    Pack years: 30.00    Types: Cigarettes    Quit date: 03/01/1960    Years since quitting: 59.5  . Smokeless tobacco: Never Used  Vaping Use  . Vaping Use: Never used  Substance Use Topics  . Alcohol use: No  . Drug use: No    Comment: h/o recreational drug use in the 24's         OPHTHALMIC EXAM:  Base Eye Exam    Visual Acuity (Snellen - Linear)      Right Left   Dist cc 20/200 20/400   Dist ph cc 20/100-2 20/200       Tonometry (Tonopen, 1:59 PM)      Right Left   Pressure 17 17       Pupils      Pupils Dark Light Shape React APD   Right PERRL 4 3 Round Slow None   Left PERRL 4 3 Round Slow None       Visual Fields (Counting fingers)      Left Right     Full Full       Extraocular Movement      Right Left    Full Full       Neuro/Psych    Oriented x3: Yes   Mood/Affect: Normal       Dilation    Both eyes: 1.0% Mydriacyl, 2.5% Phenylephrine @ 2:00 PM        Slit Lamp and Fundus Exam    External Exam      Right Left   External Normal Normal  Slit Lamp Exam      Right Left   Lids/Lashes Normal Normal   Conjunctiva/Sclera White and quiet White and quiet   Cornea Clear Clear   Anterior Chamber Deep and quiet Deep and quiet   Iris Round and reactive Round and reactive   Lens Posterior chamber intraocular lens Posterior chamber intraocular lens   Anterior Vitreous Normal Normal       Fundus Exam      Right Left   Posterior Vitreous Normal Normal   Disc Normal Pallor   C/D Ratio 0.7 0.85   Macula Soft drusen, Macular thickening Retinal pigment epithelial mottling, no macular thickening, Soft drusen   Vessels Normal Normal   Periphery Normal Normal          IMAGING AND PROCEDURES  Imaging and Procedures for 09/19/19  OCT, Retina - OU - Both Eyes       Right Eye Quality was good. Scan locations included subfoveal. Central Foveal Thickness: 348. Progression has worsened. Findings include subretinal hyper-reflective material, subretinal fluid, retinal drusen , abnormal foveal contour.   Left Eye Quality was borderline. Scan locations included subfoveal. Progression has been stable. Findings include retinal drusen , no SRF, no IRF.   Notes OD, subretinal fluid, new CNVM OD.  Need to commence therapy to prevent further visual acuity decline.       Intravitreal Injection, Pharmacologic Agent - OD - Right Eye       Time Out 09/19/2019. 3:47 PM. Confirmed correct patient, procedure, site, and patient consented.   Anesthesia Topical anesthesia was used. Anesthetic medications included Akten 3.5%.   Procedure Preparation included Ofloxacin , 10% betadine to eyelids, 5% betadine to ocular surface. A 30  gauge needle was used.   Injection:  1.25 mg Bevacizumab (AVASTIN) SOLN   NDC: [address]-2, Lot: [id number]   Route: Intravitreal, Site: Right Eye, Waste: 0 mg  Post-op Post injection exam found visual acuity of at least counting fingers. The patient tolerated the procedure well. There were no complications. The patient received written and verbal post procedure care education. Post injection medications were not given.                 ASSESSMENT/PLAN:  Exudative age-related macular degeneration of right eye with active choroidal neovascularization ([place]) Commence with intravitreal Avastin therapy to halt growth of CNVM OD, goal is to prevent further vision loss and perhaps regain some      ICD-10-CM   1. Exudative age-related macular degeneration of right eye with active choroidal neovascularization (HCC)  H35.3211 OCT, Retina - OU - Both Eyes    Intravitreal Injection, Pharmacologic Agent - OD - Right Eye    Bevacizumab (AVASTIN) SOLN 1.25 mg  2. Primary open angle glaucoma of left eye, severe stage  H40.1123     1.  Intravitreal Avastin OD today.  Risk and benefits reviewed.  2.  Dr. Quentin Ore as scheduled  3.  Ophthalmic Meds Ordered this visit:  Meds ordered this encounter  Medications  . Bevacizumab (AVASTIN) SOLN 1.25 mg       Return in about 6 weeks (around 10/31/2019) for dilate, OD, AVASTIN OCT.  There are no Patient Instructions on file for this visit.   Explained the diagnoses, plan, and follow up with the patient and they expressed understanding.  Patient expressed understanding of the importance of proper follow up care.   Clent Demark Desira Alessandrini M.D. Diseases & Surgery of the Retina and Vitreous Retina & Diabetic [place] 09/19/19  Abbreviations: M myopia (nearsighted); A astigmatism; H hyperopia (farsighted); P presbyopia; Mrx spectacle prescription;  CTL contact lenses; OD right eye; OS left eye; OU both eyes  XT exotropia; ET esotropia; PEK  punctate epithelial keratitis; PEE punctate epithelial erosions; DES dry eye syndrome; MGD meibomian gland dysfunction; ATs artificial tears; PFAT's preservative free artificial tears; [place] nuclear sclerotic cataract; PSC posterior subcapsular cataract; ERM epi-retinal membrane; PVD posterior vitreous detachment; RD retinal detachment; DM diabetes mellitus; DR diabetic retinopathy; NPDR non-proliferative diabetic retinopathy; PDR proliferative diabetic retinopathy; CSME clinically significant macular edema; DME diabetic macular edema; dbh dot blot hemorrhages; CWS cotton wool spot; POAG primary open angle glaucoma; C/D cup-to-disc ratio; HVF humphrey visual field; GVF goldmann visual field; OCT optical coherence tomography; IOP intraocular pressure; BRVO Branch retinal vein occlusion; CRVO central retinal vein occlusion; CRAO central retinal artery occlusion; BRAO branch retinal artery occlusion; RT retinal tear; SB scleral buckle; PPV pars plana vitrectomy; VH Vitreous hemorrhage; PRP panretinal laser photocoagulation; IVK intravitreal kenalog; VMT vitreomacular traction; MH Macular hole;  NVD neovascularization of the disc; NVE neovascularization elsewhere; AREDS age related eye disease study; ARMD age related macular degeneration; POAG primary open angle glaucoma; EBMD epithelial/anterior basement membrane dystrophy; ACIOL anterior chamber intraocular lens; IOL intraocular lens; PCIOL posterior chamber intraocular lens; Phaco/IOL phacoemulsification with intraocular lens placement; [place] photorefractive keratectomy; LASIK laser assisted in situ keratomileusis; HTN hypertension; DM diabetes mellitus; COPD chronic obstructive pulmonary disease

## 2019-09-28 DIAGNOSIS — N183 Chronic kidney disease, stage 3 unspecified: Secondary | ICD-10-CM | POA: Diagnosis not present

## 2019-09-28 DIAGNOSIS — G309 Alzheimer's disease, unspecified: Secondary | ICD-10-CM | POA: Diagnosis not present

## 2019-09-28 DIAGNOSIS — I129 Hypertensive chronic kidney disease with stage 1 through stage 4 chronic kidney disease, or unspecified chronic kidney disease: Secondary | ICD-10-CM | POA: Diagnosis not present

## 2019-09-28 DIAGNOSIS — R69 Illness, unspecified: Secondary | ICD-10-CM | POA: Diagnosis not present

## 2019-10-25 ENCOUNTER — Other Ambulatory Visit: Payer: Self-pay

## 2019-10-25 ENCOUNTER — Encounter (INDEPENDENT_AMBULATORY_CARE_PROVIDER_SITE_OTHER): Payer: Self-pay | Admitting: Ophthalmology

## 2019-10-25 ENCOUNTER — Ambulatory Visit (INDEPENDENT_AMBULATORY_CARE_PROVIDER_SITE_OTHER): Payer: Medicare HMO | Admitting: Ophthalmology

## 2019-10-25 DIAGNOSIS — H353211 Exudative age-related macular degeneration, right eye, with active choroidal neovascularization: Secondary | ICD-10-CM | POA: Diagnosis not present

## 2019-10-25 MED ORDER — BEVACIZUMAB CHEMO INJECTION 1.25MG/0.05ML SYRINGE FOR KALEIDOSCOPE
1.2500 mg | INTRAVITREAL | Status: AC | PRN
  Administered 2019-10-25: 1.25 mg via INTRAVITREAL

## 2019-10-25 NOTE — Progress Notes (Signed)
10/25/2019     CHIEF COMPLAINT Patient presents for Retina Follow Up   HISTORY OF PRESENT ILLNESS: Kurt Haffey Yo Sr. is a 86 y.o. male who presents to the clinic today for:   HPI    Retina Follow Up    Patient presents with  Wet AMD.  In right eye.  Severity is moderate.  Duration of 5 weeks.  Since onset it is stable.  I, the attending physician,  performed the HPI with the patient and updated documentation appropriately.          Comments    5 Week Wet AMD f\u OD. Possible Avastin OD. OCT  Pt states vision has improved. Denies any complaints.       Last edited by Tilda Franco on 10/25/2019  2:03 PM. (History)      Referring physician: Curlene Labrum, MD [place],  [place] [id number]  HISTORICAL INFORMATION:   Selected notes from the MEDICAL RECORD NUMBER       CURRENT MEDICATIONS: Current Outpatient Medications (Ophthalmic Drugs)  Medication Sig  . brimonidine (ALPHAGAN) 0.15 % ophthalmic solution Place 1 drop into the left eye 2 (two) times daily.  Marland Kitchen latanoprost (XALATAN) 0.005 % ophthalmic solution Place 1 drop into both eyes at bedtime.    No current facility-administered medications for this visit. (Ophthalmic Drugs)   Current Outpatient Medications (Other)  Medication Sig  . atenolol-chlorthalidone (TENORETIC) 100-25 MG per tablet Take 1 tablet by mouth daily.   . diphenhydrAMINE (BENADRYL) 25 MG tablet Take 50 mg by mouth at bedtime.   Marland Kitchen KLOR-CON M10 10 MEQ tablet TAKE 1 TABLET BY MOUTH ONCE DAILY  . levothyroxine (SYNTHROID, LEVOTHROID) 25 MCG tablet Take 25 mcg by mouth daily before breakfast.   . mirabegron ER (MYRBETRIQ) 25 MG TB24 tablet Take 25 mg by mouth daily.  . Multiple Vitamins-Minerals (OCUVITE EXTRA PO) Take 1 tablet by mouth daily.  . Probiotic CAPS Take 1 capsule by mouth daily.   No current facility-administered medications for this visit. (Other)      REVIEW OF SYSTEMS:    ALLERGIES No Known Allergies  PAST  MEDICAL HISTORY Past Medical History:  Diagnosis Date  . BPH (benign prostatic hypertrophy)   . Coronary artery disease cardiologist-  [name] (previously dr Domenic Polite)   abnormal stress test 11-07-2015 ; s/p  cardiac cath 11-27-2015 , non-obstructive cad and normal LVF (pLAD to mLAD 20%; mCx 20%)  . Dementia ([place])    per wife he has dementia but has not been dx by a md  . Essential hypertension, benign   . Glaucoma, both eyes   . Hypothyroidism   . Loose bowel movements   . Lower urinary tract symptoms (LUTS)   . Mixed hyperlipidemia   . OA (osteoarthritis)   . Personal history of MI (myocardial infarction)    per stress test 11-07-2015 large fixed inferior defect consistent w/ old infarction  . PVC's (premature ventricular contractions)   . RBBB (right bundle branch block)   . Wears glasses    Past Surgical History:  Procedure Laterality Date  . APPENDECTOMY  1948  . CARDIAC CATHETERIZATION N/A 11/27/2015   Procedure: Left Heart Cath and Coronary Angiography;  Surgeon: Burnell Blanks, MD;  Location: [facility];  Service: Cardiovascular;  Laterality: N/A;   MILD NON-OBSTRUCTIVE CAD, NORMAL LVSF, EF 55-65%  . CARDIAC CATHETERIZATION  08-23-2007   dr cooper   essentially normal coronary arteries w/ no obstructive cad,  normal lvf, ef 65%  . CARDIOVASCULAR STRESS TEST  11-07-2015   [facility], [place]   Intermediate risk nuclear study w/ large fixed inferior defect noted consistent with old infarction, mild reversibility in the inferior wall concerning for ischemia/  normal LV function and wall motion , ef 57%  . CATARACT EXTRACTION W/ INTRAOCULAR LENS  IMPLANT, BILATERAL  2015  approx.  . CYSTOSCOPY WITH BIOPSY N/A 05/02/2017   Procedure: CYSTOSCOPY WITH BLADDER BIOPSY AND FULGURATION;  Surgeon: Cleon Gustin, MD;  [facility]: [facility];  Service: Urology;  Laterality: N/A;  . CYSTOSCOPY WITH INSERTION OF UROLIFT N/A 01/31/2017   Procedure: CYSTOSCOPY WITH  INSERTION OF UROLIFT;  Surgeon: Cleon Gustin, MD;  Location: [facility];  Service: Urology;  Laterality: N/A;  . CYSTOSCOPY WITH INSERTION OF UROLIFT N/A 05/02/2017   Procedure: CYSTOSCOPY WITH INSERTION OF UROLIFT;  Surgeon: Cleon Gustin, MD;  Location: [facility];  Service: Urology;  Laterality: N/A;  . ERCP N/A 04/04/2017   Procedure: ENDOSCOPIC RETROGRADE CHOLANGIOPANCREATOGRAPHY (ERCP);  Surgeon: Rogene Houston, MD;  [facility];  Service: Endoscopy;  Laterality: N/A;  . KNEE ARTHROPLASTY Left 02/19/2016   Procedure: LEFT TOTAL KNEE ARTHROPLASTY WITH COMPUTER NAVIGATION;  Surgeon: Rod Can, MD;  [facility]: [facility];  Service: Orthopedics;  Laterality: Left;  . STENT REMOVAL  04/04/2017   Procedure: BILIARY STENT REMOVAL;  Surgeon: Rogene Houston, MD;  [facility];  Service: Endoscopy;;  . TONSILLECTOMY  child    FAMILY HISTORY Family History  Problem Relation Age of Onset  . Colon cancer Mother   . Other Father        MVA    SOCIAL HISTORY Social History   Tobacco Use  . Smoking status: Former Smoker    Packs/day: 1.00    Years: 30.00    Pack years: 30.00    Types: Cigarettes    Quit date: 03/01/1960    Years since quitting: 59.6  . Smokeless tobacco: Never Used  Vaping Use  . Vaping Use: Never used  Substance Use Topics  . Alcohol use: No  . Drug use: No    Comment: h/o recreational drug use in the 48's         OPHTHALMIC EXAM: Base Eye Exam    Visual Acuity (Snellen - Linear)      Right Left   Dist cc 20/400 E Card @ 2'   Dist ph cc NI NI       Tonometry (Tonopen, 2:10 PM)      Right Left   Pressure 17 18       Pupils      Pupils Dark Light Shape React APD   Right PERRL 4 3 Round Slow None   Left PERRL 4 3 Round Slow None       Visual Fields (Counting fingers)      Left Right     Full   Restrictions Partial outer superior nasal deficiency        Neuro/Psych    Oriented x3: Yes   Mood/Affect:  Normal       Dilation    Right eye: 1.0% Mydriacyl, 2.5% Phenylephrine @ 2:10 PM        Slit Lamp and Fundus Exam    External Exam      Right Left   External Normal Normal       Slit Lamp Exam      Right Left   Lids/Lashes Normal Normal  Conjunctiva/Sclera White and quiet White and quiet   Cornea Clear Clear   Anterior Chamber Deep and quiet Deep and quiet   Iris Round and reactive Round and reactive   Lens Posterior chamber intraocular lens Posterior chamber intraocular lens   Anterior Vitreous Normal Normal       Fundus Exam      Right Left   Posterior Vitreous Normal    Disc Normal    C/D Ratio 0.7    Macula Soft drusen,  less Macular thickening, Geographic atrophy    Vessels Normal    Periphery Normal           IMAGING AND PROCEDURES  Imaging and Procedures for 10/25/19  OCT, Retina - OU - Both Eyes       Right Eye Quality was good. Scan locations included subfoveal. Central Foveal Thickness: 250. Progression has improved. Findings include cystoid macular edema, central retinal atrophy, subretinal scarring, disciform scar, no SRF.   Left Eye Quality was good. Central Foveal Thickness: 198. Progression has been stable. Findings include no SRF, no IRF, outer retinal atrophy, central retinal atrophy.   Notes Macular anatomy OD has improved nicely post intravitreal Avastin No. 1, 5 weeks previous, repeat intravitreal Avastin today as much less subretinal fluid       Intravitreal Injection, Pharmacologic Agent - OD - Right Eye       Time Out 10/25/2019. 3:20 PM. Confirmed correct patient, procedure, site, and patient consented.   Anesthesia Topical anesthesia was used. Anesthetic medications included Akten 3.5%.   Procedure Preparation included Tobramycin 0.3%, 10% betadine to eyelids, 5% betadine to ocular surface. A supplied needle was used.   Injection:  1.25 mg Bevacizumab (AVASTIN) SOLN   NDC: [address]-7   Route: Intravitreal, Site: Right  Eye, Waste: 0 mg  Post-op Post injection exam found visual acuity of at least counting fingers. The patient tolerated the procedure well. There were no complications. The patient received written and verbal post procedure care education. Post injection medications were not given.                 ASSESSMENT/PLAN:  Exudative age-related macular degeneration of right eye with active choroidal neovascularization (HCC) Ocular anatomy vastly improved OD status post Avastin No. 1, repeat injection today and examination in 5 to 6 weeks      ICD-10-CM   1. Exudative age-related macular degeneration of right eye with active choroidal neovascularization (HCC)  H35.3211 OCT, Retina - OU - Both Eyes    Intravitreal Injection, Pharmacologic Agent - OD - Right Eye    Bevacizumab (AVASTIN) SOLN 1.25 mg    1.  Repeat injection intravitreal Avastin OD today  2.  Follow-up examination in 6 to 7 weeks for likely repeat injection Avastin stabilize and improve her visual acuity  3.  Ophthalmic Meds Ordered this visit:  Meds ordered this encounter  Medications  . Bevacizumab (AVASTIN) SOLN 1.25 mg       Return in about 7 weeks (around 12/13/2019) for dilate, AVASTIN OCT, OD.  Patient Instructions  For promptly any new visual acuity distortions or declines in either eye    Explained the diagnoses, plan, and follow up with the patient and they expressed understanding.  Patient expressed understanding of the importance of proper follow up care.   Clent Demark Ieshia Hatcher M.D. Diseases & Surgery of the Retina and Vitreous Retina & Diabetic [place] 10/25/19     Abbreviations: M myopia (nearsighted); A astigmatism; H hyperopia (farsighted); P presbyopia; Mrx  spectacle prescription;  CTL contact lenses; OD right eye; OS left eye; OU both eyes  XT exotropia; ET esotropia; PEK punctate epithelial keratitis; PEE punctate epithelial erosions; DES dry eye syndrome; MGD meibomian gland dysfunction; ATs  artificial tears; PFAT's preservative free artificial tears; [place] nuclear sclerotic cataract; PSC posterior subcapsular cataract; ERM epi-retinal membrane; PVD posterior vitreous detachment; RD retinal detachment; DM diabetes mellitus; DR diabetic retinopathy; NPDR non-proliferative diabetic retinopathy; PDR proliferative diabetic retinopathy; CSME clinically significant macular edema; DME diabetic macular edema; dbh dot blot hemorrhages; CWS cotton wool spot; POAG primary open angle glaucoma; C/D cup-to-disc ratio; HVF humphrey visual field; GVF goldmann visual field; OCT optical coherence tomography; IOP intraocular pressure; BRVO Branch retinal vein occlusion; CRVO central retinal vein occlusion; CRAO central retinal artery occlusion; BRAO branch retinal artery occlusion; RT retinal tear; SB scleral buckle; PPV pars plana vitrectomy; VH Vitreous hemorrhage; PRP panretinal laser photocoagulation; IVK intravitreal kenalog; VMT vitreomacular traction; MH Macular hole;  NVD neovascularization of the disc; NVE neovascularization elsewhere; AREDS age related eye disease study; ARMD age related macular degeneration; POAG primary open angle glaucoma; EBMD epithelial/anterior basement membrane dystrophy; ACIOL anterior chamber intraocular lens; IOL intraocular lens; PCIOL posterior chamber intraocular lens; Phaco/IOL phacoemulsification with intraocular lens placement; [place] photorefractive keratectomy; LASIK laser assisted in situ keratomileusis; HTN hypertension; DM diabetes mellitus; COPD chronic obstructive pulmonary disease

## 2019-10-25 NOTE — Patient Instructions (Signed)
For promptly any new visual acuity distortions or declines in either eye

## 2019-10-25 NOTE — Assessment & Plan Note (Signed)
Ocular anatomy vastly improved OD status post Avastin No. 1, repeat injection today and examination in 5 to 6 weeks

## 2019-10-30 DIAGNOSIS — R69 Illness, unspecified: Secondary | ICD-10-CM | POA: Diagnosis not present

## 2019-10-30 DIAGNOSIS — N183 Chronic kidney disease, stage 3 unspecified: Secondary | ICD-10-CM | POA: Diagnosis not present

## 2019-10-30 DIAGNOSIS — I129 Hypertensive chronic kidney disease with stage 1 through stage 4 chronic kidney disease, or unspecified chronic kidney disease: Secondary | ICD-10-CM | POA: Diagnosis not present

## 2019-10-30 DIAGNOSIS — G309 Alzheimer's disease, unspecified: Secondary | ICD-10-CM | POA: Diagnosis not present

## 2019-11-01 ENCOUNTER — Encounter (INDEPENDENT_AMBULATORY_CARE_PROVIDER_SITE_OTHER): Payer: Medicare HMO | Admitting: Ophthalmology

## 2019-11-29 DIAGNOSIS — I129 Hypertensive chronic kidney disease with stage 1 through stage 4 chronic kidney disease, or unspecified chronic kidney disease: Secondary | ICD-10-CM | POA: Diagnosis not present

## 2019-11-29 DIAGNOSIS — N183 Chronic kidney disease, stage 3 unspecified: Secondary | ICD-10-CM | POA: Diagnosis not present

## 2019-11-29 DIAGNOSIS — G309 Alzheimer's disease, unspecified: Secondary | ICD-10-CM | POA: Diagnosis not present

## 2019-11-29 DIAGNOSIS — R69 Illness, unspecified: Secondary | ICD-10-CM | POA: Diagnosis not present

## 2019-12-11 DIAGNOSIS — G309 Alzheimer's disease, unspecified: Secondary | ICD-10-CM | POA: Diagnosis not present

## 2019-12-11 DIAGNOSIS — I10 Essential (primary) hypertension: Secondary | ICD-10-CM | POA: Diagnosis not present

## 2019-12-11 DIAGNOSIS — R319 Hematuria, unspecified: Secondary | ICD-10-CM | POA: Diagnosis not present

## 2019-12-11 DIAGNOSIS — Z23 Encounter for immunization: Secondary | ICD-10-CM | POA: Diagnosis not present

## 2019-12-11 DIAGNOSIS — N182 Chronic kidney disease, stage 2 (mild): Secondary | ICD-10-CM | POA: Diagnosis not present

## 2019-12-11 DIAGNOSIS — Z6834 Body mass index (BMI) 34.0-34.9, adult: Secondary | ICD-10-CM | POA: Diagnosis not present

## 2019-12-11 DIAGNOSIS — N323 Diverticulum of bladder: Secondary | ICD-10-CM | POA: Diagnosis not present

## 2019-12-17 ENCOUNTER — Encounter (INDEPENDENT_AMBULATORY_CARE_PROVIDER_SITE_OTHER): Payer: Medicare HMO | Admitting: Ophthalmology

## 2019-12-17 ENCOUNTER — Other Ambulatory Visit: Payer: Self-pay

## 2019-12-17 ENCOUNTER — Ambulatory Visit (INDEPENDENT_AMBULATORY_CARE_PROVIDER_SITE_OTHER): Payer: Medicare HMO | Admitting: Ophthalmology

## 2019-12-17 ENCOUNTER — Encounter (INDEPENDENT_AMBULATORY_CARE_PROVIDER_SITE_OTHER): Payer: Self-pay | Admitting: Ophthalmology

## 2019-12-17 DIAGNOSIS — H353211 Exudative age-related macular degeneration, right eye, with active choroidal neovascularization: Secondary | ICD-10-CM

## 2019-12-17 DIAGNOSIS — H401123 Primary open-angle glaucoma, left eye, severe stage: Secondary | ICD-10-CM | POA: Diagnosis not present

## 2019-12-17 MED ORDER — BEVACIZUMAB CHEMO INJECTION 1.25MG/0.05ML SYRINGE FOR KALEIDOSCOPE
1.2500 mg | INTRAVITREAL | Status: AC | PRN
  Administered 2019-12-17: 1.25 mg via INTRAVITREAL

## 2019-12-17 NOTE — Assessment & Plan Note (Signed)
Much improved anatomy, now with foveal atrophy accounting for the acuity, status post Avastin No. 2 for subfoveal CNVM.

## 2019-12-17 NOTE — Progress Notes (Signed)
12/17/2019     CHIEF COMPLAINT Patient presents for Retina Follow Up   HISTORY OF PRESENT ILLNESS: Kurt Kessinger Zehnder Sr. is a 86 y.o. male who presents to the clinic today for:   HPI    Retina Follow Up    Patient presents with  Wet AMD.  In right eye.  Severity is moderate.  Duration of 7 weeks.  Since onset it is stable.  I, the attending physician,  performed the HPI with the patient and updated documentation appropriately.          Comments    7 Week Wet AMD f\u OD. Possible Avastin OD. OCT  Pt states he has not noticed any changes in vision. Pt states he does not see well. Using gtts as directed. Pt is scheduled to see glaucoma specialist on Wednesday.       Last edited by Tilda Franco on 12/17/2019  2:22 PM. (History)      Referring physician: Curlene Labrum, MD [place],  [place] [id number]  HISTORICAL INFORMATION:   Selected notes from the MEDICAL RECORD NUMBER       CURRENT MEDICATIONS: Current Outpatient Medications (Ophthalmic Drugs)  Medication Sig  . brimonidine (ALPHAGAN) 0.15 % ophthalmic solution Place 1 drop into the left eye 2 (two) times daily.  Marland Kitchen latanoprost (XALATAN) 0.005 % ophthalmic solution Place 1 drop into both eyes at bedtime.    No current facility-administered medications for this visit. (Ophthalmic Drugs)   Current Outpatient Medications (Other)  Medication Sig  . atenolol-chlorthalidone (TENORETIC) 100-25 MG per tablet Take 1 tablet by mouth daily.   . diphenhydrAMINE (BENADRYL) 25 MG tablet Take 50 mg by mouth at bedtime.   Marland Kitchen KLOR-CON M10 10 MEQ tablet TAKE 1 TABLET BY MOUTH ONCE DAILY  . levothyroxine (SYNTHROID, LEVOTHROID) 25 MCG tablet Take 25 mcg by mouth daily before breakfast.   . mirabegron ER (MYRBETRIQ) 25 MG TB24 tablet Take 25 mg by mouth daily.  . Multiple Vitamins-Minerals (OCUVITE EXTRA PO) Take 1 tablet by mouth daily.  . Probiotic CAPS Take 1 capsule by mouth daily.   No current facility-administered  medications for this visit. (Other)      REVIEW OF SYSTEMS:    ALLERGIES No Known Allergies  PAST MEDICAL HISTORY Past Medical History:  Diagnosis Date  . BPH (benign prostatic hypertrophy)   . Coronary artery disease cardiologist-  [name] (previously dr Domenic Polite)   abnormal stress test 11-07-2015 ; s/p  cardiac cath 11-27-2015 , non-obstructive cad and normal LVF (pLAD to mLAD 20%; mCx 20%)  . Dementia ([place])    per wife he has dementia but has not been dx by a md  . Essential hypertension, benign   . Glaucoma, both eyes   . Hypothyroidism   . Loose bowel movements   . Lower urinary tract symptoms (LUTS)   . Mixed hyperlipidemia   . OA (osteoarthritis)   . Personal history of MI (myocardial infarction)    per stress test 11-07-2015 large fixed inferior defect consistent w/ old infarction  . PVC's (premature ventricular contractions)   . RBBB (right bundle branch block)   . Wears glasses    Past Surgical History:  Procedure Laterality Date  . APPENDECTOMY  1948  . CARDIAC CATHETERIZATION N/A 11/27/2015   Procedure: Left Heart Cath and Coronary Angiography;  Surgeon: Burnell Blanks, MD;  Location: [facility];  Service: Cardiovascular;  Laterality: N/A;   MILD NON-OBSTRUCTIVE CAD, NORMAL LVSF,  EF 55-65%  . CARDIAC CATHETERIZATION  08-23-2007   dr cooper   essentially normal coronary arteries w/ no obstructive cad, normal lvf, ef 65%  . CARDIOVASCULAR STRESS TEST  11-07-2015   [facility], [place]   Intermediate risk nuclear study w/ large fixed inferior defect noted consistent with old infarction, mild reversibility in the inferior wall concerning for ischemia/  normal LV function and wall motion , ef 57%  . CATARACT EXTRACTION W/ INTRAOCULAR LENS  IMPLANT, BILATERAL  2015  approx.  . CYSTOSCOPY WITH BIOPSY N/A 05/02/2017   Procedure: CYSTOSCOPY WITH BLADDER BIOPSY AND FULGURATION;  Surgeon: Cleon Gustin, MD;  Location: [facility];  Service:  Urology;  Laterality: N/A;  . CYSTOSCOPY WITH INSERTION OF UROLIFT N/A 01/31/2017   Procedure: CYSTOSCOPY WITH INSERTION OF UROLIFT;  Surgeon: Cleon Gustin, MD;  Location: [facility];  Service: Urology;  Laterality: N/A;  . CYSTOSCOPY WITH INSERTION OF UROLIFT N/A 05/02/2017   Procedure: CYSTOSCOPY WITH INSERTION OF UROLIFT;  Surgeon: Cleon Gustin, MD;  Location: [facility];  Service: Urology;  Laterality: N/A;  . ERCP N/A 04/04/2017   Procedure: ENDOSCOPIC RETROGRADE CHOLANGIOPANCREATOGRAPHY (ERCP);  Surgeon: Rogene Houston, MD;  [facility];  Service: Endoscopy;  Laterality: N/A;  . KNEE ARTHROPLASTY Left 02/19/2016   Procedure: LEFT TOTAL KNEE ARTHROPLASTY WITH COMPUTER NAVIGATION;  Surgeon: Rod Can, MD;  [facility];  Service: Orthopedics;  Laterality: Left;  . STENT REMOVAL  04/04/2017   Procedure: BILIARY STENT REMOVAL;  Surgeon: Rogene Houston, MD;  [facility];  Service: Endoscopy;;  . TONSILLECTOMY  child    FAMILY HISTORY Family History  Problem Relation Age of Onset  . Colon cancer Mother   . Other Father        MVA    SOCIAL HISTORY Social History   Tobacco Use  . Smoking status: Former Smoker    Packs/day: 1.00    Years: 30.00    Pack years: 30.00    Types: Cigarettes    Quit date: 03/01/1960    Years since quitting: 59.8  . Smokeless tobacco: Never Used  Vaping Use  . Vaping Use: Never used  Substance Use Topics  . Alcohol use: No  . Drug use: No    Comment: h/o recreational drug use in the 85's         OPHTHALMIC EXAM: Base Eye Exam    Visual Acuity (Snellen - Linear)      Right Left   Dist cc 20/400 + CF @ 2'   Dist ph cc NI NI   Correction: Glasses       Tonometry (Tonopen, 2:27 PM)      Right Left   Pressure 17 18       Pupils      Pupils Dark Light Shape React APD   Right PERRL 4 3 Round Slow None   Left PERRL 4 3 Round Slow None       Visual Fields (Counting fingers)       Left Right    Full Full       Neuro/Psych    Oriented x3: Yes   Mood/Affect: Normal       Dilation    Right eye: 1.0% Mydriacyl, 2.5% Phenylephrine @ 2:27 PM        Slit Lamp and Fundus Exam    External Exam      Right Left   External Normal Normal  Slit Lamp Exam      Right Left   Lids/Lashes Normal Normal   Conjunctiva/Sclera White and quiet White and quiet   Cornea Clear Clear   Anterior Chamber Deep and quiet Deep and quiet   Iris Round and reactive Round and reactive   Lens Posterior chamber intraocular lens Posterior chamber intraocular lens   Anterior Vitreous Normal Normal       Fundus Exam      Right Left   Posterior Vitreous Normal    Disc Normal    C/D Ratio 0.7    Macula Soft drusen,  less Macular thickening, Geographic atrophy    Vessels Normal    Periphery Normal           IMAGING AND PROCEDURES  Imaging and Procedures for 12/17/19  OCT, Retina - OU - Both Eyes       Right Eye Quality was good. Scan locations included subfoveal. Central Foveal Thickness: 234. Progression has improved. Findings include cystoid macular edema, central retinal atrophy, subretinal scarring, disciform scar, no SRF.   Left Eye Quality was good. Scan locations included subfoveal. Central Foveal Thickness: 202. Progression has been stable. Findings include no SRF, no IRF, outer retinal atrophy, central retinal atrophy.   Notes Macular anatomy OD has improved nicely post intravitreal Avastin No. 2, 7 weeks previous, repeat intravitreal Avastin today as much less subretinal fluid       Intravitreal Injection, Pharmacologic Agent - OD - Right Eye       Time Out 12/17/2019. 3:26 PM. Confirmed correct patient, procedure, site, and patient consented.   Anesthesia Topical anesthesia was used. Anesthetic medications included Akten 3.5%.   Procedure Preparation included Tobramycin 0.3%, 10% betadine to eyelids, 5% betadine to ocular surface, Ofloxacin . A 30  gauge needle was used.   Injection:  1.25 mg Bevacizumab (AVASTIN) SOLN   NDC: 70360-001-02, Lot: [id number]   Route: Intravitreal, Site: Right Eye, Waste: 0 mg  Post-op Post injection exam found visual acuity of at least counting fingers. The patient tolerated the procedure well. There were no complications. The patient received written and verbal post procedure care education. Post injection medications were not given.                 ASSESSMENT/PLAN:  Exudative age-related macular degeneration of right eye with active choroidal neovascularization (HCC) Much improved anatomy, now with foveal atrophy accounting for the acuity, status post Avastin No. 2 for subfoveal CNVM.  Primary open angle glaucoma of left eye, severe stage Follow-up as scheduled      ICD-10-CM   1. Exudative age-related macular degeneration of right eye with active choroidal neovascularization (HCC)  H35.3211 OCT, Retina - OU - Both Eyes    Intravitreal Injection, Pharmacologic Agent - OD - Right Eye    Bevacizumab (AVASTIN) SOLN 1.25 mg  2. Primary open angle glaucoma of left eye, severe stage  H40.1123     1.  Repeat intravitreal Avastin OD today for vastly improved macular anatomy from subfoveal CNVM therapy, Avastin No. 3 today  2.  Patient scheduled to see "glaucoma specialist" later this week.  I encouraged that visit.  [facility]  3.  Ophthalmic Meds Ordered this visit:  Meds ordered this encounter  Medications  . Bevacizumab (AVASTIN) SOLN 1.25 mg       Return in about 7 weeks (around 02/04/2020) for dilate, OD, AVASTIN OCT.  Patient Instructions  Patient instructed to contact the office promptly for new onset visual acuity decline  or distortions    Explained the diagnoses, plan, and follow up with the patient and they expressed understanding.  Patient expressed understanding of the importance of proper follow up care.   Clent Demark Trevonn Hallum M.D. Diseases & Surgery of the Retina and  Vitreous Retina & Diabetic [place] 12/17/19     Abbreviations: M myopia (nearsighted); A astigmatism; H hyperopia (farsighted); P presbyopia; Mrx spectacle prescription;  CTL contact lenses; OD right eye; OS left eye; OU both eyes  XT exotropia; ET esotropia; PEK punctate epithelial keratitis; PEE punctate epithelial erosions; DES dry eye syndrome; MGD meibomian gland dysfunction; ATs artificial tears; PFAT's preservative free artificial tears; [place] nuclear sclerotic cataract; PSC posterior subcapsular cataract; ERM epi-retinal membrane; PVD posterior vitreous detachment; RD retinal detachment; DM diabetes mellitus; DR diabetic retinopathy; NPDR non-proliferative diabetic retinopathy; PDR proliferative diabetic retinopathy; CSME clinically significant macular edema; DME diabetic macular edema; dbh dot blot hemorrhages; CWS cotton wool spot; POAG primary open angle glaucoma; C/D cup-to-disc ratio; HVF humphrey visual field; GVF goldmann visual field; OCT optical coherence tomography; IOP intraocular pressure; BRVO Branch retinal vein occlusion; CRVO central retinal vein occlusion; CRAO central retinal artery occlusion; BRAO branch retinal artery occlusion; RT retinal tear; SB scleral buckle; PPV pars plana vitrectomy; VH Vitreous hemorrhage; PRP panretinal laser photocoagulation; IVK intravitreal kenalog; VMT vitreomacular traction; MH Macular hole;  NVD neovascularization of the disc; NVE neovascularization elsewhere; AREDS age related eye disease study; ARMD age related macular degeneration; POAG primary open angle glaucoma; EBMD epithelial/anterior basement membrane dystrophy; ACIOL anterior chamber intraocular lens; IOL intraocular lens; PCIOL posterior chamber intraocular lens; Phaco/IOL phacoemulsification with intraocular lens placement; [place] photorefractive keratectomy; LASIK laser assisted in situ keratomileusis; HTN hypertension; DM diabetes mellitus; COPD chronic obstructive pulmonary disease

## 2019-12-17 NOTE — Assessment & Plan Note (Signed)
Follow up as scheduled.  

## 2019-12-17 NOTE — Patient Instructions (Signed)
Patient instructed to contact the office promptly for new onset visual acuity decline or distortions 

## 2019-12-19 DIAGNOSIS — H401133 Primary open-angle glaucoma, bilateral, severe stage: Secondary | ICD-10-CM | POA: Diagnosis not present

## 2020-01-07 DIAGNOSIS — I6782 Cerebral ischemia: Secondary | ICD-10-CM | POA: Diagnosis not present

## 2020-01-07 DIAGNOSIS — R413 Other amnesia: Secondary | ICD-10-CM | POA: Diagnosis not present

## 2020-01-07 DIAGNOSIS — G319 Degenerative disease of nervous system, unspecified: Secondary | ICD-10-CM | POA: Diagnosis not present

## 2020-01-07 DIAGNOSIS — R69 Illness, unspecified: Secondary | ICD-10-CM | POA: Diagnosis not present

## 2020-01-29 DIAGNOSIS — I129 Hypertensive chronic kidney disease with stage 1 through stage 4 chronic kidney disease, or unspecified chronic kidney disease: Secondary | ICD-10-CM | POA: Diagnosis not present

## 2020-01-29 DIAGNOSIS — R69 Illness, unspecified: Secondary | ICD-10-CM | POA: Diagnosis not present

## 2020-01-29 DIAGNOSIS — N183 Chronic kidney disease, stage 3 unspecified: Secondary | ICD-10-CM | POA: Diagnosis not present

## 2020-01-29 DIAGNOSIS — G309 Alzheimer's disease, unspecified: Secondary | ICD-10-CM | POA: Diagnosis not present

## 2020-02-04 ENCOUNTER — Encounter (INDEPENDENT_AMBULATORY_CARE_PROVIDER_SITE_OTHER): Payer: Medicare HMO | Admitting: Ophthalmology

## 2020-02-06 ENCOUNTER — Encounter (INDEPENDENT_AMBULATORY_CARE_PROVIDER_SITE_OTHER): Payer: Self-pay | Admitting: Ophthalmology

## 2020-02-06 ENCOUNTER — Ambulatory Visit (INDEPENDENT_AMBULATORY_CARE_PROVIDER_SITE_OTHER): Payer: Medicare HMO | Admitting: Ophthalmology

## 2020-02-06 ENCOUNTER — Other Ambulatory Visit: Payer: Self-pay

## 2020-02-06 DIAGNOSIS — H353211 Exudative age-related macular degeneration, right eye, with active choroidal neovascularization: Secondary | ICD-10-CM

## 2020-02-06 DIAGNOSIS — H353124 Nonexudative age-related macular degeneration, left eye, advanced atrophic with subfoveal involvement: Secondary | ICD-10-CM

## 2020-02-06 DIAGNOSIS — H353114 Nonexudative age-related macular degeneration, right eye, advanced atrophic with subfoveal involvement: Secondary | ICD-10-CM | POA: Insufficient documentation

## 2020-02-06 MED ORDER — BEVACIZUMAB 2.5 MG/0.1ML IZ SOSY
2.5000 mg | PREFILLED_SYRINGE | INTRAVITREAL | Status: AC | PRN
  Administered 2020-02-06: 2.5 mg via INTRAVITREAL

## 2020-02-06 NOTE — Progress Notes (Signed)
02/06/2020     CHIEF COMPLAINT Patient presents for Retina Follow Up   HISTORY OF PRESENT ILLNESS: Kurt Doig Ashby Sr. is a 86 y.o. male who presents to the clinic today for:   HPI    Retina Follow Up    Patient presents with  Wet AMD.  In right eye.  This started 7 weeks ago.  Severity is mild.  Duration of 7 weeks.  Since onset it is stable.          Comments    7 WK F/U OD, POSS AVASTIN OD. Pt takes a 3rd drop Rx'd by Dr. Kathlen Mody but unsure of what it is.    Pt reports stable vision, no new f/f, no pain or pressure. Pt complains he cannot see out of glasses.        Last edited by Nichola Sizer D on 02/06/2020 10:32 AM. (History)      Referring physician: Curlene Labrum, MD [place],  [place] [address]  HISTORICAL INFORMATION:   Selected notes from the MEDICAL RECORD NUMBER       CURRENT MEDICATIONS: Current Outpatient Medications (Ophthalmic Drugs)  Medication Sig  . brimonidine (ALPHAGAN) 0.15 % ophthalmic solution Place 1 drop into the left eye 2 (two) times daily.  Marland Kitchen latanoprost (XALATAN) 0.005 % ophthalmic solution Place 1 drop into both eyes at bedtime.    No current facility-administered medications for this visit. (Ophthalmic Drugs)   Current Outpatient Medications (Other)  Medication Sig  . atenolol-chlorthalidone (TENORETIC) 100-25 MG per tablet Take 1 tablet by mouth daily.   . diphenhydrAMINE (BENADRYL) 25 MG tablet Take 50 mg by mouth at bedtime.   Marland Kitchen KLOR-CON M10 10 MEQ tablet TAKE 1 TABLET BY MOUTH ONCE DAILY  . levothyroxine (SYNTHROID, LEVOTHROID) 25 MCG tablet Take 25 mcg by mouth daily before breakfast.   . mirabegron ER (MYRBETRIQ) 25 MG TB24 tablet Take 25 mg by mouth daily.  . Multiple Vitamins-Minerals (OCUVITE EXTRA PO) Take 1 tablet by mouth daily.  . Probiotic CAPS Take 1 capsule by mouth daily.   No current facility-administered medications for this visit. (Other)      REVIEW OF SYSTEMS:    ALLERGIES No Known  Allergies  PAST MEDICAL HISTORY Past Medical History:  Diagnosis Date  . BPH (benign prostatic hypertrophy)   . Coronary artery disease cardiologist-  [name] (previously dr Domenic Polite)   abnormal stress test 11-07-2015 ; s/p  cardiac cath 11-27-2015 , non-obstructive cad and normal LVF (pLAD to mLAD 20%; mCx 20%)  . Dementia ([place])    per wife he has dementia but has not been dx by a md  . Essential hypertension, benign   . Glaucoma, both eyes   . Hypothyroidism   . Loose bowel movements   . Lower urinary tract symptoms (LUTS)   . Mixed hyperlipidemia   . OA (osteoarthritis)   . Personal history of MI (myocardial infarction)    per stress test 11-07-2015 large fixed inferior defect consistent w/ old infarction  . PVC's (premature ventricular contractions)   . RBBB (right bundle branch block)   . Wears glasses    Past Surgical History:  Procedure Laterality Date  . APPENDECTOMY  1948  . CARDIAC CATHETERIZATION N/A 11/27/2015   Procedure: Left Heart Cath and Coronary Angiography;  Surgeon: Burnell Blanks, MD;  Location: [facility];  Service: Cardiovascular;  Laterality: N/A;   MILD NON-OBSTRUCTIVE CAD, NORMAL LVSF, EF 55-65%  . CARDIAC CATHETERIZATION  08-23-2007   dr cooper   essentially normal coronary arteries w/ no obstructive cad, normal lvf, ef 65%  . CARDIOVASCULAR STRESS TEST  11-07-2015   [facility], [place]   Intermediate risk nuclear study w/ large fixed inferior defect noted consistent with old infarction, mild reversibility in the inferior wall concerning for ischemia/  normal LV function and wall motion , ef 57%  . CATARACT EXTRACTION W/ INTRAOCULAR LENS  IMPLANT, BILATERAL  2015  approx.  . CYSTOSCOPY WITH BIOPSY N/A 05/02/2017   Procedure: CYSTOSCOPY WITH BLADDER BIOPSY AND FULGURATION;  Surgeon: Cleon Gustin, MD;  Location: [facility];  Service: Urology;  Laterality: N/A;  . CYSTOSCOPY WITH INSERTION OF UROLIFT N/A 01/31/2017   Procedure:  CYSTOSCOPY WITH INSERTION OF UROLIFT;  Surgeon: Cleon Gustin, MD;  Location: [facility];  Service: Urology;  Laterality: N/A;  . CYSTOSCOPY WITH INSERTION OF UROLIFT N/A 05/02/2017   Procedure: CYSTOSCOPY WITH INSERTION OF UROLIFT;  Surgeon: Cleon Gustin, MD;  Location: [facility];  Service: Urology;  Laterality: N/A;  . ERCP N/A 04/04/2017   Procedure: ENDOSCOPIC RETROGRADE CHOLANGIOPANCREATOGRAPHY (ERCP);  Surgeon: Rogene Houston, MD;  [facility];  Service: Endoscopy;  Laterality: N/A;  . KNEE ARTHROPLASTY Left 02/19/2016   Procedure: LEFT TOTAL KNEE ARTHROPLASTY WITH COMPUTER NAVIGATION;  Surgeon: Rod Can, MD;  Location: [facility];  Service: Orthopedics;  Laterality: Left;  . STENT REMOVAL  04/04/2017   Procedure: BILIARY STENT REMOVAL;  Surgeon: Rogene Houston, MD;  [facility];  Service: Endoscopy;;  . TONSILLECTOMY  child    FAMILY HISTORY Family History  Problem Relation Age of Onset  . Colon cancer Mother   . Other Father        MVA    SOCIAL HISTORY Social History   Tobacco Use  . Smoking status: Former Smoker    Packs/day: 1.00    Years: 30.00    Pack years: 30.00    Types: Cigarettes    Quit date: 03/01/1960    Years since quitting: 59.9  . Smokeless tobacco: Never Used  Vaping Use  . Vaping Use: Never used  Substance Use Topics  . Alcohol use: No  . Drug use: No    Comment: h/o recreational drug use in the 66's         OPHTHALMIC EXAM: Base Eye Exam    Visual Acuity (ETDRS)      Right Left   Dist [place] 20/400 CF at 2'   Dist ph [place] NI        Tonometry (Tonopen, 10:38 AM)      Right Left   Pressure 16 21  2x OS       Pupils      Pupils Dark Light Shape React APD   Right PERRL 4 3 Round Slow None   Left PERRL 4 3 Round Slow None       Visual Fields (Counting fingers)      Left Right     Full   Restrictions Partial outer superior nasal, inferior nasal deficiencies        Extraocular Movement       Right Left    Full Full       Neuro/Psych    Oriented x3: Yes   Mood/Affect: Normal       Dilation    Right eye: 1.0% Mydriacyl, 2.5% Phenylephrine @ 10:39 AM        Slit Lamp and Fundus Exam  External Exam      Right Left   External Normal Normal       Slit Lamp Exam      Right Left   Lids/Lashes Normal Normal   Conjunctiva/Sclera White and quiet White and quiet   Cornea Clear Clear   Anterior Chamber Deep and quiet Deep and quiet   Iris Round and reactive Round and reactive   Lens Posterior chamber intraocular lens Posterior chamber intraocular lens   Anterior Vitreous Normal Normal       Fundus Exam      Right Left   Posterior Vitreous Normal    Disc Normal    C/D Ratio 0.7    Macula Soft drusen,  less Macular thickening, Geographic atrophy, Hard drusen, no hemorrhage    Vessels Normal    Periphery Normal           IMAGING AND PROCEDURES  Imaging and Procedures for 02/06/20  OCT, Retina - OU - Both Eyes       Right Eye Quality was good. Scan locations included subfoveal. Central Foveal Thickness: 222. Progression has improved. Findings include abnormal foveal contour, no IRF, no SRF.   Left Eye Quality was good. Scan locations included subfoveal. Progression has been stable. Findings include abnormal foveal contour, outer retinal atrophy, central retinal atrophy, inner retinal atrophy, retinal drusen , no SRF, no IRF.   Notes Much less active CNVM much less subretinal fluid as compared to July 2021 OD, currently at 7-week follow-up interval       Intravitreal Injection, Pharmacologic Agent - OD - Right Eye       Time Out 02/06/2020. 11:24 AM. Confirmed correct patient, procedure, site, and patient consented.   Anesthesia Topical anesthesia was used. Anesthetic medications included Akten 3.5%.   Procedure Preparation included Tobramycin 0.3%, 10% betadine to eyelids, 5% betadine to ocular surface. A 30 gauge needle was used.    Injection:  2.5 mg Bevacizumab (AVASTIN) 2.5mg /0.50mL SOSY   NDC: 02585-277-82, Lot: [id number]   Route: Intravitreal, Site: Right Eye  Post-op Post injection exam found visual acuity of at least counting fingers. The patient tolerated the procedure well. There were no complications. The patient received written and verbal post procedure care education. Post injection medications were not given.                 ASSESSMENT/PLAN:  Exudative age-related macular degeneration of right eye with active choroidal neovascularization (HCC) Subretinal scarring accounts for acuity yet much less subretinal fluid currently post Avastin currently 7-week interval.  Repeat today and examination OD in 9-week  Advanced nonexudative age-related macular degeneration of left eye with subfoveal involvement No signs of CNVM OS      ICD-10-CM   1. Exudative age-related macular degeneration of right eye with active choroidal neovascularization (HCC)  H35.3211 OCT, Retina - OU - Both Eyes    Intravitreal Injection, Pharmacologic Agent - OD - Right Eye    bevacizumab (AVASTIN) SOSY 2.5 mg  2. Advanced nonexudative age-related macular degeneration of left eye with subfoveal involvement  H35.3124     1.  2.  3.  Ophthalmic Meds Ordered this visit:  Meds ordered this encounter  Medications  . bevacizumab (AVASTIN) SOSY 2.5 mg       Return in about 9 weeks (around 04/09/2020) for DILATE OU, AVASTIN OCT, OD.  There are no Patient Instructions on file for this visit.   Explained the diagnoses, plan, and follow up with the patient and they expressed understanding.  Patient expressed understanding of the importance of proper follow up care.   Clent Demark Thao Bauza M.D. Diseases & Surgery of the Retina and Vitreous Retina & Diabetic [place] 02/06/20     Abbreviations: M myopia (nearsighted); A astigmatism; H hyperopia (farsighted); P presbyopia; Mrx spectacle prescription;  CTL contact lenses; OD  right eye; OS left eye; OU both eyes  XT exotropia; ET esotropia; PEK punctate epithelial keratitis; PEE punctate epithelial erosions; DES dry eye syndrome; MGD meibomian gland dysfunction; ATs artificial tears; PFAT's preservative free artificial tears; [place] nuclear sclerotic cataract; PSC posterior subcapsular cataract; ERM epi-retinal membrane; PVD posterior vitreous detachment; RD retinal detachment; DM diabetes mellitus; DR diabetic retinopathy; NPDR non-proliferative diabetic retinopathy; PDR proliferative diabetic retinopathy; CSME clinically significant macular edema; DME diabetic macular edema; dbh dot blot hemorrhages; CWS cotton wool spot; POAG primary open angle glaucoma; C/D cup-to-disc ratio; HVF humphrey visual field; GVF goldmann visual field; OCT optical coherence tomography; IOP intraocular pressure; BRVO Branch retinal vein occlusion; CRVO central retinal vein occlusion; CRAO central retinal artery occlusion; BRAO branch retinal artery occlusion; RT retinal tear; SB scleral buckle; PPV pars plana vitrectomy; VH Vitreous hemorrhage; PRP panretinal laser photocoagulation; IVK intravitreal kenalog; VMT vitreomacular traction; MH Macular hole;  NVD neovascularization of the disc; NVE neovascularization elsewhere; AREDS age related eye disease study; ARMD age related macular degeneration; POAG primary open angle glaucoma; EBMD epithelial/anterior basement membrane dystrophy; ACIOL anterior chamber intraocular lens; IOL intraocular lens; PCIOL posterior chamber intraocular lens; Phaco/IOL phacoemulsification with intraocular lens placement; [place] photorefractive keratectomy; LASIK laser assisted in situ keratomileusis; HTN hypertension; DM diabetes mellitus; COPD chronic obstructive pulmonary disease

## 2020-02-06 NOTE — Assessment & Plan Note (Signed)
Subretinal scarring accounts for acuity yet much less subretinal fluid currently post Avastin currently 7-week interval.  Repeat today and examination OD in 9-week

## 2020-02-06 NOTE — Patient Instructions (Signed)
Patient instructed to contact the office promptly for new onset visual declines distortions or changes  Okay to have testing for glasses at any time.  I did explain that the scarring from the previous macular degenerative changes will limit the vision, which is technically currently legally blind but still with ambulatory ability

## 2020-02-06 NOTE — Assessment & Plan Note (Signed)
No signs of CNVM OS

## 2020-02-12 DIAGNOSIS — L570 Actinic keratosis: Secondary | ICD-10-CM | POA: Diagnosis not present

## 2020-02-29 DIAGNOSIS — N183 Chronic kidney disease, stage 3 unspecified: Secondary | ICD-10-CM | POA: Diagnosis not present

## 2020-02-29 DIAGNOSIS — R69 Illness, unspecified: Secondary | ICD-10-CM | POA: Diagnosis not present

## 2020-02-29 DIAGNOSIS — I129 Hypertensive chronic kidney disease with stage 1 through stage 4 chronic kidney disease, or unspecified chronic kidney disease: Secondary | ICD-10-CM | POA: Diagnosis not present

## 2020-02-29 DIAGNOSIS — G309 Alzheimer's disease, unspecified: Secondary | ICD-10-CM | POA: Diagnosis not present

## 2020-03-29 DIAGNOSIS — G309 Alzheimer's disease, unspecified: Secondary | ICD-10-CM | POA: Diagnosis not present

## 2020-03-29 DIAGNOSIS — I129 Hypertensive chronic kidney disease with stage 1 through stage 4 chronic kidney disease, or unspecified chronic kidney disease: Secondary | ICD-10-CM | POA: Diagnosis not present

## 2020-03-29 DIAGNOSIS — R69 Illness, unspecified: Secondary | ICD-10-CM | POA: Diagnosis not present

## 2020-03-29 DIAGNOSIS — N183 Chronic kidney disease, stage 3 unspecified: Secondary | ICD-10-CM | POA: Diagnosis not present

## 2020-04-09 ENCOUNTER — Ambulatory Visit (INDEPENDENT_AMBULATORY_CARE_PROVIDER_SITE_OTHER): Payer: Medicare HMO | Admitting: Ophthalmology

## 2020-04-09 ENCOUNTER — Encounter (INDEPENDENT_AMBULATORY_CARE_PROVIDER_SITE_OTHER): Payer: Self-pay | Admitting: Ophthalmology

## 2020-04-09 ENCOUNTER — Other Ambulatory Visit: Payer: Self-pay

## 2020-04-09 DIAGNOSIS — H353114 Nonexudative age-related macular degeneration, right eye, advanced atrophic with subfoveal involvement: Secondary | ICD-10-CM

## 2020-04-09 DIAGNOSIS — H353211 Exudative age-related macular degeneration, right eye, with active choroidal neovascularization: Secondary | ICD-10-CM

## 2020-04-09 MED ORDER — BEVACIZUMAB 2.5 MG/0.1ML IZ SOSY
2.5000 mg | PREFILLED_SYRINGE | INTRAVITREAL | Status: AC | PRN
  Administered 2020-04-09: 2.5 mg via INTRAVITREAL

## 2020-04-09 NOTE — Progress Notes (Signed)
04/09/2020     CHIEF COMPLAINT Patient presents for Retina Follow Up (9 Week F/U OU, poss Avastin OD//Pt denies noticeable changes to [place] OU since last visit. Pt denies ocular pain, flashes of light, or floaters OU. Pt sts he is not using any eye drops.//)   HISTORY OF PRESENT ILLNESS: Kurt French. is a 87 y.o. male who presents to the clinic today for:   HPI    Retina Follow Up    Patient presents with  Wet AMD.  In right eye.  This started 9 weeks ago.  Severity is mild.  Duration of 9 weeks.  Since onset it is stable. Additional comments: 9 Week F/U OU, poss Avastin OD  Pt denies noticeable changes to [place] OU since last visit. Pt denies ocular pain, flashes of light, or floaters OU. Pt sts he is not using any eye drops.         Last edited by Rockie Neighbours, [place] on 04/09/2020 10:55 AM. (History)      Referring physician: Curlene Labrum, MD [place],  [place] [address]  HISTORICAL INFORMATION:   Selected notes from the MEDICAL RECORD NUMBER       CURRENT MEDICATIONS: Current Outpatient Medications (Ophthalmic Drugs)  Medication Sig  . brimonidine (ALPHAGAN) 0.15 % ophthalmic solution Place 1 drop into the left eye 2 (two) times daily. (Patient not taking: Reported on 04/09/2020)  . latanoprost (XALATAN) 0.005 % ophthalmic solution Place 1 drop into both eyes at bedtime.  (Patient not taking: Reported on 04/09/2020)   No current facility-administered medications for this visit. (Ophthalmic Drugs)   Current Outpatient Medications (Other)  Medication Sig  . atenolol-chlorthalidone (TENORETIC) 100-25 MG per tablet Take 1 tablet by mouth daily.   . diphenhydrAMINE (BENADRYL) 25 MG tablet Take 50 mg by mouth at bedtime.   Marland Kitchen KLOR-CON M10 10 MEQ tablet TAKE 1 TABLET BY MOUTH ONCE DAILY  . levothyroxine (SYNTHROID, LEVOTHROID) 25 MCG tablet Take 25 mcg by mouth daily before breakfast.   . mirabegron ER (MYRBETRIQ) 25 MG TB24 tablet Take 25 mg by mouth daily.  . Multiple  Vitamins-Minerals (OCUVITE EXTRA PO) Take 1 tablet by mouth daily.  . Probiotic CAPS Take 1 capsule by mouth daily.   No current facility-administered medications for this visit. (Other)      REVIEW OF SYSTEMS:    ALLERGIES No Known Allergies  PAST MEDICAL HISTORY Past Medical History:  Diagnosis Date  . BPH (benign prostatic hypertrophy)   . Coronary artery disease cardiologist-  [name] (previously dr Domenic Polite)   abnormal stress test 11-07-2015 ; s/p  cardiac cath 11-27-2015 , non-obstructive cad and normal LVF (pLAD to mLAD 20%; mCx 20%)  . Dementia ([place])    per wife he has dementia but has not been dx by a md  . Essential hypertension, benign   . Glaucoma, both eyes   . Hypothyroidism   . Loose bowel movements   . Lower urinary tract symptoms (LUTS)   . Mixed hyperlipidemia   . OA (osteoarthritis)   . Personal history of MI (myocardial infarction)    per stress test 11-07-2015 large fixed inferior defect consistent w/ old infarction  . PVC's (premature ventricular contractions)   . RBBB (right bundle branch block)   . Wears glasses    Past Surgical History:  Procedure Laterality Date  . APPENDECTOMY  1948  . CARDIAC CATHETERIZATION N/A 11/27/2015   Procedure: Left Heart Cath and Coronary Angiography;  Surgeon:  Burnell Blanks, MD;  Location: [facility];  Service: Cardiovascular;  Laterality: N/A;   MILD NON-OBSTRUCTIVE CAD, NORMAL LVSF, EF 55-65%  . CARDIAC CATHETERIZATION  08-23-2007   dr cooper   essentially normal coronary arteries w/ no obstructive cad, normal lvf, ef 65%  . CARDIOVASCULAR STRESS TEST  11-07-2015   [facility], [place]   Intermediate risk nuclear study w/ large fixed inferior defect noted consistent with old infarction, mild reversibility in the inferior wall concerning for ischemia/  normal LV function and wall motion , ef 57%  . CATARACT EXTRACTION W/ INTRAOCULAR LENS  IMPLANT, BILATERAL  2015  approx.  . CYSTOSCOPY  WITH BIOPSY N/A 05/02/2017   Procedure: CYSTOSCOPY WITH BLADDER BIOPSY AND FULGURATION;  Surgeon: Cleon Gustin, MD;  Location: [facility];  Service: Urology;  Laterality: N/A;  . CYSTOSCOPY WITH INSERTION OF UROLIFT N/A 01/31/2017   Procedure: CYSTOSCOPY WITH INSERTION OF UROLIFT;  Surgeon: Cleon Gustin, MD;  Location: [facility];  Service: Urology;  Laterality: N/A;  . CYSTOSCOPY WITH INSERTION OF UROLIFT N/A 05/02/2017   Procedure: CYSTOSCOPY WITH INSERTION OF UROLIFT;  Surgeon: Cleon Gustin, MD;  Location: [facility];  Service: Urology;  Laterality: N/A;  . ERCP N/A 04/04/2017   Procedure: ENDOSCOPIC RETROGRADE CHOLANGIOPANCREATOGRAPHY (ERCP);  Surgeon: Rogene Houston, MD;  [facility];  Service: Endoscopy;  Laterality: N/A;  . KNEE ARTHROPLASTY Left 02/19/2016   Procedure: LEFT TOTAL KNEE ARTHROPLASTY WITH COMPUTER NAVIGATION;  Surgeon: Rod Can, MD;  [facility];  Service: Orthopedics;  Laterality: Left;  . STENT REMOVAL  04/04/2017   Procedure: BILIARY STENT REMOVAL;  Surgeon: Rogene Houston, MD;  [facility];  Service: Endoscopy;;  . TONSILLECTOMY  child    FAMILY HISTORY Family History  Problem Relation Age of Onset  . Colon cancer Mother   . Other Father        MVA    SOCIAL HISTORY Social History   Tobacco Use  . Smoking status: Former Smoker    Packs/day: 1.00    Years: 30.00    Pack years: 30.00    Types: Cigarettes    Quit date: 03/01/1960    Years since quitting: 60.1  . Smokeless tobacco: Never Used  Vaping Use  . Vaping Use: Never used  Substance Use Topics  . Alcohol use: No  . Drug use: No    Comment: h/o recreational drug use in the 37's         OPHTHALMIC EXAM:  Base Eye Exam    Visual Acuity (ETDRS)      Right Left   Dist cc 20/400 CF @ 4'   Dist ph cc NI NI   Correction: Glasses       Tonometry (Tonopen, 10:57 AM)      Right Left   Pressure 18 23       Pupils      Pupils Dark  Light Shape React APD   Right PERRL 5 4 Round Slow None   Left PERRL 5 4 Round Slow None       Visual Fields (Counting fingers)      Left Right     Full   Restrictions Total superior temporal deficiency; Partial outer inferior temporal deficiency        Extraocular Movement      Right Left    Full Full       Neuro/Psych    Oriented x3: Yes  Mood/Affect: Normal       Dilation    Right eye: 1.0% Mydriacyl, 2.5% Phenylephrine @ 11:00 AM        Slit Lamp and Fundus Exam    External Exam      Right Left   External Normal Normal       Slit Lamp Exam      Right Left   Lids/Lashes Normal Normal   Conjunctiva/Sclera White and quiet White and quiet   Cornea Clear Clear   Anterior Chamber Deep and quiet Deep and quiet   Iris Round and reactive Round and reactive   Lens Posterior chamber intraocular lens Posterior chamber intraocular lens   Anterior Vitreous Normal Normal       Fundus Exam      Right Left   Posterior Vitreous Normal    Disc Normal    C/D Ratio 0.7    Macula Soft drusen,  less Macular thickening, Geographic atrophy in the FAZ, Hard drusen, no hemorrhage    Vessels Normal    Periphery Normal           IMAGING AND PROCEDURES  Imaging and Procedures for 04/09/20  OCT, Retina - OU - Both Eyes       Right Eye Quality was good. Scan locations included subfoveal. Central Foveal Thickness: 222. Progression has improved. Findings include abnormal foveal contour, no IRF, no SRF.   Left Eye Quality was good. Scan locations included subfoveal. Progression has been stable. Findings include abnormal foveal contour, outer retinal atrophy, central retinal atrophy, inner retinal atrophy, retinal drusen , no SRF, no IRF.   Notes Much less active CNVM much less subretinal fluid as compared to July 2021 OD, currently at 9-week follow-up interval  Central foveal atrophy OD accounts for acuity.  No signs of perifoveal CNVM formation today       Intravitreal  Injection, Pharmacologic Agent - OD - Right Eye       Time Out 04/09/2020. 12:18 PM. Confirmed correct patient, procedure, site, and patient consented.   Anesthesia Topical anesthesia was used. Anesthetic medications included Akten 3.5%.   Procedure Preparation included Tobramycin 0.3%, 10% betadine to eyelids, 5% betadine to ocular surface. A 30 gauge needle was used.   Injection:  2.5 mg Bevacizumab (AVASTIN) 2.5mg /0.41mL SOSY   NDC: 68032-122-48, Lot: [id number]   Route: Intravitreal, Site: Right Eye  Post-op Post injection exam found visual acuity of at least counting fingers. The patient tolerated the procedure well. There were no complications. The patient received written and verbal post procedure care education. Post injection medications were not given.                 ASSESSMENT/PLAN:  Advanced nonexudative age-related macular degeneration of right eye with subfoveal involvement Central atrophy accounts for acuity  Exudative age-related macular degeneration of right eye with active choroidal neovascularization (HCC) Much less active intraretinal fluid and stable on the extension from 7 to 9-week interval, will repeat injection today and extend interval of 9 weeks today to 3 months next      ICD-10-CM   1. Exudative age-related macular degeneration of right eye with active choroidal neovascularization (HCC)  H35.3211 OCT, Retina - OU - Both Eyes    Intravitreal Injection, Pharmacologic Agent - OD - Right Eye    bevacizumab (AVASTIN) SOSY 2.5 mg  2. Advanced nonexudative age-related macular degeneration of right eye with subfoveal involvement  H35.3114     1.  OD vastly improved macular condition since onset of  wet age-related maculopathy August 2021.  Currently follow-up at 9-week interval.  Repeat injection today and exam interval again in 3 months  2.  Dilate OU next possible Avastin OD  3.  Ophthalmic Meds Ordered this visit:  Meds ordered this encounter   Medications  . bevacizumab (AVASTIN) SOSY 2.5 mg       Return in about 3 months (around 07/07/2020) for DILATE OU, AVASTIN OCT, OD.  There are no Patient Instructions on file for this visit.   Explained the diagnoses, plan, and follow up with the patient and they expressed understanding.  Patient expressed understanding of the importance of proper follow up care.   Clent Demark Tahiry Spicer M.D. Diseases & Surgery of the Retina and Vitreous Retina & Diabetic [place] 04/09/20     Abbreviations: M myopia (nearsighted); A astigmatism; H hyperopia (farsighted); P presbyopia; Mrx spectacle prescription;  CTL contact lenses; OD right eye; OS left eye; OU both eyes  XT exotropia; ET esotropia; PEK punctate epithelial keratitis; PEE punctate epithelial erosions; DES dry eye syndrome; MGD meibomian gland dysfunction; ATs artificial tears; PFAT's preservative free artificial tears; [place] nuclear sclerotic cataract; PSC posterior subcapsular cataract; ERM epi-retinal membrane; PVD posterior vitreous detachment; RD retinal detachment; DM diabetes mellitus; DR diabetic retinopathy; NPDR non-proliferative diabetic retinopathy; PDR proliferative diabetic retinopathy; CSME clinically significant macular edema; DME diabetic macular edema; dbh dot blot hemorrhages; CWS cotton wool spot; POAG primary open angle glaucoma; C/D cup-to-disc ratio; HVF humphrey visual field; GVF goldmann visual field; OCT optical coherence tomography; IOP intraocular pressure; BRVO Branch retinal vein occlusion; CRVO central retinal vein occlusion; CRAO central retinal artery occlusion; BRAO branch retinal artery occlusion; RT retinal tear; SB scleral buckle; PPV pars plana vitrectomy; VH Vitreous hemorrhage; PRP panretinal laser photocoagulation; IVK intravitreal kenalog; VMT vitreomacular traction; MH Macular hole;  NVD neovascularization of the disc; NVE neovascularization elsewhere; AREDS age related eye disease study; ARMD age related  macular degeneration; POAG primary open angle glaucoma; EBMD epithelial/anterior basement membrane dystrophy; ACIOL anterior chamber intraocular lens; IOL intraocular lens; PCIOL posterior chamber intraocular lens; Phaco/IOL phacoemulsification with intraocular lens placement; [place] photorefractive keratectomy; LASIK laser assisted in situ keratomileusis; HTN hypertension; DM diabetes mellitus; COPD chronic obstructive pulmonary disease

## 2020-04-09 NOTE — Assessment & Plan Note (Signed)
Much less active intraretinal fluid and stable on the extension from 7 to 9-week interval, will repeat injection today and extend interval of 9 weeks today to 3 months next

## 2020-04-09 NOTE — Assessment & Plan Note (Signed)
Central atrophy accounts for acuity

## 2020-04-20 ENCOUNTER — Other Ambulatory Visit: Payer: Self-pay | Admitting: Urology

## 2020-04-22 ENCOUNTER — Telehealth: Payer: Self-pay

## 2020-04-23 ENCOUNTER — Other Ambulatory Visit: Payer: Self-pay

## 2020-04-23 ENCOUNTER — Ambulatory Visit (INDEPENDENT_AMBULATORY_CARE_PROVIDER_SITE_OTHER): Payer: Medicare HMO | Admitting: Urology

## 2020-04-23 ENCOUNTER — Encounter: Payer: Self-pay | Admitting: Urology

## 2020-04-23 ENCOUNTER — Ambulatory Visit (HOSPITAL_COMMUNITY): Payer: Medicare HMO

## 2020-04-23 VITALS — BP 135/78 | HR 64 | Temp 98.5°F | Ht 69.0 in | Wt 230.0 lb

## 2020-04-23 DIAGNOSIS — R351 Nocturia: Secondary | ICD-10-CM | POA: Diagnosis not present

## 2020-04-23 DIAGNOSIS — R310 Gross hematuria: Secondary | ICD-10-CM | POA: Diagnosis not present

## 2020-04-23 DIAGNOSIS — N138 Other obstructive and reflux uropathy: Secondary | ICD-10-CM

## 2020-04-23 DIAGNOSIS — N3021 Other chronic cystitis with hematuria: Secondary | ICD-10-CM

## 2020-04-23 DIAGNOSIS — N401 Enlarged prostate with lower urinary tract symptoms: Secondary | ICD-10-CM | POA: Diagnosis not present

## 2020-04-23 DIAGNOSIS — H401133 Primary open-angle glaucoma, bilateral, severe stage: Secondary | ICD-10-CM | POA: Diagnosis not present

## 2020-04-23 LAB — URINALYSIS, ROUTINE W REFLEX MICROSCOPIC
Bilirubin, UA: NEGATIVE
Glucose, UA: NEGATIVE
Nitrite, UA: NEGATIVE
Specific Gravity, UA: 1.025 (ref 1.005–1.030)
Urobilinogen, Ur: 0.2 mg/dL (ref 0.2–1.0)
pH, UA: 5.5 (ref 5.0–7.5)

## 2020-04-23 LAB — BLADDER SCAN AMB NON-IMAGING: Scan Result: 176

## 2020-04-23 LAB — MICROSCOPIC EXAMINATION
Epithelial Cells (non renal): NONE SEEN /hpf (ref 0–10)
RBC, Urine: >30 /hpf — AB (ref 0–2)
Renal Epithel, UA: NONE SEEN /hpf

## 2020-04-23 MED ORDER — TAMSULOSIN HCL 0.4 MG PO CAPS: 0.4000 mg | ORAL_CAPSULE | Freq: Every day | ORAL | 11 refills | Status: DC

## 2020-04-23 MED ORDER — NITROFURANTOIN MONOHYD MACRO 100 MG PO CAPS: 100.0000 mg | ORAL_CAPSULE | Freq: Two times a day (BID) | ORAL | 0 refills | Status: AC

## 2020-04-23 NOTE — Progress Notes (Signed)
04/23/2020 9:30 AM   [name] 1932/12/28 [id number]  Referring provider: Curlene Labrum, MD [place],  [place] [id number]  Gross hematuria  HPI: [name] is a 87yo here for evaluation of gross hematuria. UA today shows >30 RBCs with few bacteria. He was last seen 1 year ago. Since last visit his urination has become worse. He is up every 1-2 hours at night. IPSS 23. He is on finasteride. No recent abdominal imaging. He is very unhappy with his urination.  PVR 176  His records from [facility] are as follows. I have blood in my urine.  HPI: Kurt French is a 87 year-old male established patient who is here for blood in the urine.  He did see the blood in his urine. He has not seen blood clots.   He does not have a burning sensation when he urinates. He is not currently having trouble urinating.   He is not having pain. He has not recently had unwanted weight loss.   05/26/2018: He had a fall 2 weeks ago he had a fall and developed internal bleeding and gross hematuria requiring 2 units of RBCs. He is urinating well. No dysuria. UA shows 20-40 RBCs/hpf.   07/06/2018: The patient has had 3 episodes of intermittent gross hematuria. No dysuria.   01/09/2019: 1 episode of gross hematuria since last visit. He is on finasteride 5mg  daily   03/22/2019: For the past week he has had gross hematuria with associated dysuria     CC: I have acute cystitis.  HPI: He does have a burning sensation when he urinates. He does not have pelvic or rectal pain related to voiding. He does not have recurrent infections.   He is having problems with urinary control or incontinence. He is not incontinent immediately following the strong urge to urinate. He is not urinating more frequently now than usual.   1/21/202: Starting 1 week ago he developed worsening LUTS and UA is concerning for infection     AUA Symptom Score: Less than 50% of the time he has the sensation of not emptying his  bladder completely when finished urinating. Less than 20% of the time he has to urinate again fewer than two hours after he has finished urinating. Less than 50% of the time he has to start and stop again several times when he urinates. 50% of the time he finds it difficult to postpone urination. 50% of the time he has a weak urinary stream. Less than 50% of the time he has to push or strain to begin urination. He has to get up to urinate 2 times from the time he goes to bed until the time he gets up in the morning.   Calculated AUA Symptom Score: 15     PMH: Past Medical History:  Diagnosis Date  . BPH (benign prostatic hypertrophy)   . Coronary artery disease cardiologist-  [name] (previously dr Domenic Polite)   abnormal stress test 11-07-2015 ; s/p  cardiac cath 11-27-2015 , non-obstructive cad and normal LVF (pLAD to mLAD 20%; mCx 20%)  . Dementia ([place])    per wife he has dementia but has not been dx by a md  . Essential hypertension, benign   . Glaucoma, both eyes   . Hypothyroidism   . Loose bowel movements   . Lower urinary tract symptoms (LUTS)   . Mixed hyperlipidemia   . OA (osteoarthritis)   . Personal history of MI (myocardial infarction)  per stress test 11-07-2015 large fixed inferior defect consistent w/ old infarction  . PVC's (premature ventricular contractions)   . RBBB (right bundle branch block)   . Wears glasses     Surgical History: Past Surgical History:  Procedure Laterality Date  . APPENDECTOMY  1948  . CARDIAC CATHETERIZATION N/A 11/27/2015   Procedure: Left Heart Cath and Coronary Angiography;  Surgeon: Burnell Blanks, MD;  Location: [facility];  Service: Cardiovascular;  Laterality: N/A;   MILD NON-OBSTRUCTIVE CAD, NORMAL LVSF, EF 55-65%  . CARDIAC CATHETERIZATION  08-23-2007   dr cooper   essentially normal coronary arteries w/ no obstructive cad, normal lvf, ef 65%  . CARDIOVASCULAR STRESS TEST  11-07-2015   [facility], [place]  [place]   Intermediate risk nuclear study w/ large fixed inferior defect noted consistent with old infarction, mild reversibility in the inferior wall concerning for ischemia/  normal LV function and wall motion , ef 57%  . CATARACT EXTRACTION W/ INTRAOCULAR LENS  IMPLANT, BILATERAL  2015  approx.  . CYSTOSCOPY WITH BIOPSY N/A 05/02/2017   Procedure: CYSTOSCOPY WITH BLADDER BIOPSY AND FULGURATION;  Surgeon: Cleon Gustin, MD;  Location: [facility];  Service: Urology;  Laterality: N/A;  . CYSTOSCOPY WITH INSERTION OF UROLIFT N/A 01/31/2017   Procedure: CYSTOSCOPY WITH INSERTION OF UROLIFT;  Surgeon: Cleon Gustin, MD;  Location: [facility];  Service: Urology;  Laterality: N/A;  . CYSTOSCOPY WITH INSERTION OF UROLIFT N/A 05/02/2017   Procedure: CYSTOSCOPY WITH INSERTION OF UROLIFT;  Surgeon: Cleon Gustin, MD;  Location: [facility];  Service: Urology;  Laterality: N/A;  . ERCP N/A 04/04/2017   Procedure: ENDOSCOPIC RETROGRADE CHOLANGIOPANCREATOGRAPHY (ERCP);  Surgeon: Rogene Houston, MD;  Location: [facility];  Service: Endoscopy;  Laterality: N/A;  . KNEE ARTHROPLASTY Left 02/19/2016   Procedure: LEFT TOTAL KNEE ARTHROPLASTY WITH COMPUTER NAVIGATION;  Surgeon: Rod Can, MD;  Location: [facility];  Service: Orthopedics;  Laterality: Left;  . STENT REMOVAL  04/04/2017   Procedure: BILIARY STENT REMOVAL;  Surgeon: Rogene Houston, MD;  Location: [facility];  Service: Endoscopy;;  . TONSILLECTOMY  child    Home Medications:  Allergies as of 04/23/2020   No Known Allergies     Medication List       Accurate as of April 23, 2020  9:30 AM. If you have any questions, ask your nurse or doctor.        STOP taking these medications   Myrbetriq 25 MG Tb24 tablet Generic drug: mirabegron ER Stopped by: Nicolette Bang, MD     TAKE these medications   amLODipine 2.5 MG tablet Commonly known as: NORVASC Take 2.5 mg by mouth daily.   atenolol-chlorthalidone 100-25 MG  tablet Commonly known as: TENORETIC Take 1 tablet by mouth daily.   brimonidine 0.2 % ophthalmic solution Commonly known as: ALPHAGAN 1 drop 2 (two) times daily. What changed: Another medication with the same name was removed. Continue taking this medication, and follow the directions you see here. Changed by: Nicolette Bang, MD   chlorthalidone 25 MG tablet Commonly known as: HYGROTON Take 25 mg by mouth daily.   diphenhydrAMINE 25 MG tablet Commonly known as: BENADRYL Take 50 mg by mouth at bedtime.   donepezil 10 MG tablet Commonly known as: ARICEPT Take 10 mg by mouth daily.   finasteride 5 MG tablet Commonly known as: PROSCAR Take 1 tablet by mouth once daily   Klor-Con M10 10 MEQ tablet Generic drug:  potassium chloride TAKE 1 TABLET BY MOUTH ONCE DAILY   latanoprost 0.005 % ophthalmic solution Commonly known as: XALATAN Place 1 drop into both eyes at bedtime.   levothyroxine 25 MCG tablet Commonly known as: SYNTHROID Take 25 mcg by mouth daily before breakfast.   memantine 10 MG tablet Commonly known as: NAMENDA Take 10 mg by mouth 2 (two) times daily.   OCUVITE EXTRA PO Take 1 tablet by mouth daily.   Probiotic Caps Take 1 capsule by mouth daily.       Allergies: No Known Allergies  Family History: Family History  Problem Relation Age of Onset  . Colon cancer Mother   . Other Father        MVA    Social History:  reports that he quit smoking about 60 years ago. His smoking use included cigarettes. He has a 30.00 pack-year smoking history. He has never used smokeless tobacco. He reports that he does not drink alcohol and does not use drugs.  ROS: All other review of systems were reviewed and are negative except what is noted above in HPI  Physical Exam: BP 135/78   Pulse 64   Temp 98.5 F (36.9 C)   Ht 5\' 9"  (1.753 m)   Wt 230 lb (104.3 kg)   BMI 33.97 kg/m   Constitutional:  Alert and oriented, No acute distress. HEENT: [place] AT, moist  mucus membranes.  Trachea midline, no masses. Cardiovascular: No clubbing, cyanosis, or edema. Respiratory: Normal respiratory effort, no increased work of breathing. GI: Abdomen is soft, nontender, nondistended, no abdominal masses GU: No CVA tenderness.  Lymph: No cervical or inguinal lymphadenopathy. Skin: No rashes, bruises or suspicious lesions. Neurologic: Grossly intact, no focal deficits, moving all 4 extremities. Psychiatric: Normal mood and affect.  Laboratory Data: Lab Results  Component Value Date   WBC 11.5 (H) 03/17/2017   HGB 13.8 03/17/2017   HCT 42.0 03/17/2017   MCV 90.1 03/17/2017   PLT 344 03/17/2017    Lab Results  Component Value Date   CREATININE 1.59 (H) 03/17/2017    No results found for: PSA  No results found for: TESTOSTERONE  No results found for: HGBA1C  Urinalysis No results found for: COLORURINE, APPEARANCEUR, LABSPEC, PHURINE, GLUCOSEU, HGBUR, BILIRUBINUR, KETONESUR, PROTEINUR, UROBILINOGEN, NITRITE, LEUKOCYTESUR  No results found for: LABMICR, [place], RBCUA, LABEPIT, MUCUS, BACTERIA  Pertinent Imaging:  No results found for this or any previous visit.  No results found for this or any previous visit.  No results found for this or any previous visit.  No results found for this or any previous visit.  No results found for this or any previous visit.  No results found for this or any previous visit.  No results found for this or any previous visit.  No results found for this or any previous visit.   Assessment & Plan:    1. Gross hematuria -CT stone study -RTC 2 weeks for cystoscopy - Urinalysis, Routine w reflex microscopic  2. BPH with LUTS, nocturia -start flomax 0.4mg   3. Acute cystitis  Urine for culture, will call with results -macrobid 100mg  BID for 7 days   No follow-ups on file.  Nicolette Bang, MD  [facility] [place]

## 2020-04-23 NOTE — Patient Instructions (Signed)

## 2020-04-23 NOTE — Telephone Encounter (Signed)
Patient in office to see MD today.

## 2020-04-23 NOTE — Progress Notes (Signed)
Bladder Scan Patient can void: 176 ml Performed By: Durenda Guthrie, lpn  Urological Symptom Review  Patient is experiencing the following symptoms: none  Review of Systems  Gastrointestinal (upper)  : Negative for upper GI symptoms  Gastrointestinal (lower) : Diarrhea  Constitutional : Night Sweats  Skin: Itching  Eyes: Blurred vision  Ear/Nose/Throat : Sinus problems  Hematologic/Lymphatic: Negative for Hematologic/Lymphatic symptoms  Cardiovascular : Leg swelling  Respiratory : Shortness of breath  Endocrine: Negative for endocrine symptoms  Musculoskeletal: Negative for musculoskeletal symptoms  Neurological: Negative for neurological symptoms  Psychologic: Anxiety

## 2020-04-24 ENCOUNTER — Ambulatory Visit (HOSPITAL_COMMUNITY)
Admission: RE | Admit: 2020-04-24 | Discharge: 2020-04-24 | Disposition: A | Discharge status: Home / Self Care | Payer: Medicare HMO | Source: Ambulatory Visit | Attending: Urology | Admitting: Urology

## 2020-04-24 ENCOUNTER — Other Ambulatory Visit: Payer: Self-pay

## 2020-04-24 DIAGNOSIS — N400 Enlarged prostate without lower urinary tract symptoms: Secondary | ICD-10-CM | POA: Diagnosis not present

## 2020-04-24 DIAGNOSIS — R310 Gross hematuria: Secondary | ICD-10-CM | POA: Diagnosis not present

## 2020-04-24 DIAGNOSIS — R350 Frequency of micturition: Secondary | ICD-10-CM | POA: Diagnosis not present

## 2020-04-24 DIAGNOSIS — K4090 Unilateral inguinal hernia, without obstruction or gangrene, not specified as recurrent: Secondary | ICD-10-CM | POA: Diagnosis not present

## 2020-04-25 LAB — URINE CULTURE

## 2020-04-28 DIAGNOSIS — G309 Alzheimer's disease, unspecified: Secondary | ICD-10-CM | POA: Diagnosis not present

## 2020-04-28 DIAGNOSIS — R69 Illness, unspecified: Secondary | ICD-10-CM | POA: Diagnosis not present

## 2020-04-28 DIAGNOSIS — N183 Chronic kidney disease, stage 3 unspecified: Secondary | ICD-10-CM | POA: Diagnosis not present

## 2020-04-28 DIAGNOSIS — I129 Hypertensive chronic kidney disease with stage 1 through stage 4 chronic kidney disease, or unspecified chronic kidney disease: Secondary | ICD-10-CM | POA: Diagnosis not present

## 2020-04-30 ENCOUNTER — Telehealth: Payer: Self-pay

## 2020-05-05 NOTE — Telephone Encounter (Signed)
Opened in error

## 2020-05-09 ENCOUNTER — Other Ambulatory Visit: Payer: Self-pay

## 2020-05-09 ENCOUNTER — Ambulatory Visit (INDEPENDENT_AMBULATORY_CARE_PROVIDER_SITE_OTHER): Payer: Medicare HMO | Admitting: Urology

## 2020-05-09 VITALS — BP 167/80 | HR 58 | Temp 97.7°F | Ht 69.0 in | Wt 230.0 lb

## 2020-05-09 DIAGNOSIS — R310 Gross hematuria: Secondary | ICD-10-CM | POA: Diagnosis not present

## 2020-05-09 LAB — MICROSCOPIC EXAMINATION
RBC, Urine: >30 /hpf — AB (ref 0–2)
Renal Epithel, UA: NONE SEEN /hpf

## 2020-05-09 LAB — URINALYSIS, ROUTINE W REFLEX MICROSCOPIC
Bilirubin, UA: NEGATIVE
Glucose, UA: NEGATIVE
Ketones, UA: NEGATIVE
Nitrite, UA: NEGATIVE
Specific Gravity, UA: 1.025 (ref 1.005–1.030)
Urobilinogen, Ur: 0.2 mg/dL (ref 0.2–1.0)
pH, UA: 5.5 (ref 5.0–7.5)

## 2020-05-09 MED ORDER — CIPROFLOXACIN HCL 500 MG PO TABS
500.0000 mg | ORAL_TABLET | Freq: Once | ORAL | Status: AC
  Administered 2020-05-09: 500 mg via ORAL

## 2020-05-09 NOTE — Progress Notes (Signed)
   05/09/20  CC: followup gross hematuria  HPI:  Blood pressure (!) 167/80, pulse (!) 58, temperature 97.7 F (36.5 C), height 5\' 9"  (1.753 m), weight 230 lb (104.3 kg). NED. A&Ox3.   No respiratory distress   Abd soft, NT, ND Normal phallus with bilateral descended testicles  Cystoscopy Procedure Note  Patient identification was confirmed, informed consent was obtained, and patient was prepped using Betadine solution.  Lidocaine jelly was administered per urethral meatus.     Pre-Procedure: - Inspection reveals a normal caliber ureteral meatus.  Procedure: The flexible cystoscope was introduced without difficulty - No urethral strictures/lesions are present. - Enlarged prostate  - Elevated bladder neck - Bilateral ureteral orifices identified - Bladder mucosa  1cm erythematous lesion in left lateral wall diverticulum - No bladder stones - No trabeculation  Retroflexion shows 1cm intravesical prostatic protrusion   Post-Procedure: - Patient tolerated the procedure well  Assessment/ Plan: We discussed the management of his bladder lesion and bladder neck obstruction including bladder biopsy and TURP. AFter discussing the options the patient elects to proceed with bladder biopsy and TURP. Risks/benefits/alternatives discussed  No follow-ups on file.  Nicolette Bang, MD

## 2020-05-09 NOTE — Progress Notes (Signed)
Urological Symptom Review  Patient is experiencing the following symptoms: Hard to postpone urination Get up at night to urinate Leakage of urine Stream starts and stops Trouble starting stream Have to strain to urinate Blood in urine Weak stream   Review of Systems  Gastrointestinal (upper)  : Negative for upper GI symptoms  Gastrointestinal (lower) : Diarrhea  Constitutional : Night Sweats Fatigue  Skin: Skin rash/lesion  Eyes: Negative for eye symptoms  Ear/Nose/Throat : Negative for Ear/Nose/Throat symptoms  Hematologic/Lymphatic: Negative for Hematologic/Lymphatic symptoms  Cardiovascular : Leg swelling  Respiratory : Shortness of breath  Endocrine: Excessive thirst  Musculoskeletal: Back pain  Neurological: Negative for neurological symptoms  Psychologic: Negative for psychiatric symptoms

## 2020-05-11 LAB — URINE CULTURE

## 2020-05-12 NOTE — Progress Notes (Signed)
Pt called and made aware urine analysis negative.

## 2020-05-13 ENCOUNTER — Encounter: Payer: Self-pay | Admitting: Urology

## 2020-05-13 NOTE — Patient Instructions (Signed)
Bladder Biopsy A bladder biopsy is a procedure to remove a small sample of tissue from the bladder. The procedure is done so that the tissue can be examined under a microscope. You may have a bladder biopsy to diagnose or rule out cancer of the bladder. During a bladder biopsy, your health care provider may insert a long, thin scope with a lighted camera (cystoscope) into the urethra and move it into your bladder. The cystoscope will allow your health care provider to check the lining of the urethra and bladder and remove the tissue sample. If your health care provider also needs to check your ureters, a longer tube (ureteroscope) may be used. Tell your health care provider about:  Any allergies you have.  All medicines you are taking, including vitamins, herbs, eye drops, creams, and over-the-counter medicines.  Any problems you or family members have had with anesthetic medicines.  Any blood disorders you have.  Any surgeries you have had.  Any medical conditions you have.  Whether you are pregnant or may be pregnant. What are the risks? Generally, this is a safe procedure. However, problems may occur, including:  Bleeding.  Infection, especially a urinary tract infection (UTI).  Allergic reactions to medicines.  Damage to nearby structures or organs, including the urethra, bladder, or ureters.  Abdominal pain.  Burning or pain during urination.  Narrowing of the urethra due to scar tissue.  Difficulty urinating due to swelling. What happens before the procedure? Medicines Ask your health care provider about:  Changing or stopping your regular medicines. This is especially important if you are taking diabetes medicines or blood thinners.  Taking medicines such as aspirin and ibuprofen. These medicines can thin your blood. Do not take these medicines unless your health care provider tells you to take them.  Taking over-the-counter medicines, vitamins, herbs, and  supplements. Surgery safety Ask your health care provider:  How your surgery site will be marked.  What steps will be taken to help prevent infection. These may include: ? Removing hair at the surgery site. ? Washing skin with a germ-killing soap. ? Receiving antibiotic medicine. General instructions  Follow instructions from your health care provider about eating and drinking restrictions.  You may be asked to drink plenty of fluids.  You may be asked to urinate right before the procedure. You may have a urine sample taken for UTI testing.  Plan to have someone take you home from the hospital or clinic.  If you will be going home right after the procedure, plan to have someone with you for 24 hours. What happens during the procedure?  An IV may be inserted into one of your veins.  You may be given one or more of the following: ? A medicine to help you relax (sedative). ? A medicine to numb the opening of the urethra (local anesthetic). ? A medicine to make you fall asleep (general anesthetic).  You will lie on your back with your knees bent and spread apart.  The cystoscope or ureteroscope will be inserted into your urethra and guided into your bladder or ureters.  Your bladder may be slowly filled with germ-free (sterile) water. This will make it easier for your health care provider to view the wall or lining of your bladder.  Small instruments will be inserted through the scope to collect a small tissue sample that will be examined under a microscope. The procedure may vary among health care providers and hospitals.   What happens after the procedure?    Your blood pressure, heart rate, breathing rate, and blood oxygen level will be monitored until you leave the hospital or clinic.  You may be asked to empty your bladder, or your bladder may be emptied for you.  Do not drive for 24 hours if you received a sedative. Summary  A bladder biopsy is a procedure to remove a  small sample of tissue from the bladder.  You may have a bladder biopsy to diagnose or rule out cancer of the bladder.  Follow instructions from your health care provider about eating and drinking restrictions.  Ask your health care provider if you need to stop or change any medicines that you are taking.  If you will be going home right after the procedure, plan to have someone with you for 24 hours. This information is not intended to replace advice given to you by your health care provider. Make sure you discuss any questions you have with your health care provider. Document Revised: 08/23/2018 Document Reviewed: 08/23/2018 Elsevier Patient Education  2021 [employer]  

## 2020-05-14 NOTE — Progress Notes (Signed)
Medical clearance request faxed to Dr. Lizbeth Bark office

## 2020-05-20 ENCOUNTER — Telehealth: Payer: Self-pay | Admitting: Urology

## 2020-05-20 NOTE — Telephone Encounter (Signed)
Please contact patient about the next steps for his surgery.   Patient's wife called to inquire about pre-surgery information and instructions.  Thank you.

## 2020-05-21 ENCOUNTER — Ambulatory Visit: Payer: Medicare HMO | Admitting: Urology

## 2020-05-22 NOTE — Telephone Encounter (Signed)
Called patient back. No answer. No way to leave message.  Day surgery at [facility] has been attempting to reach patient to schedule pre op appts as well.  My chart message sent to patient to contact Day Surgery.

## 2020-05-23 NOTE — Telephone Encounter (Signed)
Patient has spoken with Hoyle Sauer, [place] scheduler

## 2020-06-23 NOTE — Patient Instructions (Signed)
Your procedure is scheduled on: 06/26/2020  Report to [facility] entrance at    6:45  AM.  Call this number if you have problems the morning of surgery: [phone]   Remember:   Do not Eat or Drink after midnight         No Smoking the morning of surgery  :  Take these medicines the morning of surgery with A SIP OF WATER: Amlodipine and levothyroxine   Do not wear jewelry, make-up or nail polish.  Do not wear lotions, powders, or perfumes. You may wear deodorant.  Do not shave 48 hours prior to surgery. Men may shave face and neck.  Do not bring valuables to the hospital.  Contacts, dentures or bridgework may not be worn into surgery.  Leave suitcase in the car. After surgery it may be brought to your room.  For patients admitted to the hospital, checkout time is 11:00 AM the day of discharge.   Patients discharged the day of surgery will not be allowed to drive home.    Special Instructions: Shower using CHG night before surgery and shower the day of surgery use CHG.  Use special wash - you have one bottle of CHG for all showers.  You should use approximately 1/2 of the bottle for each shower.  How to Use Chlorhexidine for Bathing Chlorhexidine gluconate (CHG) is a germ-killing (antiseptic) solution that is used to clean the skin. It can get rid of the bacteria that normally live on the skin and can keep them away for about 24 hours. To clean your skin with CHG, you may be given:  A CHG solution to use in the shower or as part of a sponge bath.  A prepackaged cloth that contains CHG. Cleaning your skin with CHG may help lower the risk for infection:  While you are staying in the intensive care unit of the hospital.  If you have a vascular access, such as a central line, to provide short-term or long-term access to your veins.  If you have a catheter to drain urine from your bladder.  If you are on a ventilator. A ventilator is a machine that helps you breathe by  moving air in and out of your lungs.  After surgery. What are the risks? Risks of using CHG include:  A skin reaction.  Hearing loss, if CHG gets in your ears.  Eye injury, if CHG gets in your eyes and is not rinsed out.  The CHG product catching fire. Make sure that you avoid smoking and flames after applying CHG to your skin. Do not use CHG:  If you have a chlorhexidine allergy or have previously reacted to chlorhexidine.  On babies younger than 58 months of age. How to use CHG solution  Use CHG only as told by your health care provider, and follow the instructions on the label.  Use the full amount of CHG as directed. Usually, this is one bottle. During a shower Follow these steps when using CHG solution during a shower (unless your health care provider gives you different instructions): 1. Start the shower. 2. Use your normal soap and shampoo to wash your face and hair. 3. Turn off the shower or move out of the shower stream. 4. Pour the CHG onto a clean washcloth. Do not use any type of brush or rough-edged sponge. 5. Starting at your neck, lather your body down to your toes. Make sure you follow these instructions: ? If you will  be having surgery, pay special attention to the part of your body where you will be having surgery. Scrub this area for at least 1 minute. ? Do not use CHG on your head or face. If the solution gets into your ears or eyes, rinse them well with water. ? Avoid your genital area. ? Avoid any areas of skin that have broken skin, cuts, or scrapes. ? Scrub your back and under your arms. Make sure to wash skin folds. 6. Let the lather sit on your skin for 1-2 minutes or as long as told by your health care provider. 7. Thoroughly rinse your entire body in the shower. Make sure that all body creases and crevices are rinsed well. 8. Dry off with a clean towel. Do not put any substances on your body afterward--such as powder, lotion, or perfume--unless you are  told to do so by your health care provider. Only use lotions that are recommended by the manufacturer. 9. Put on clean clothes or pajamas. 10. If it is the night before your surgery, sleep in clean sheets.   During a sponge bath Follow these steps when using CHG solution during a sponge bath (unless your health care provider gives you different instructions): 1. Use your normal soap and shampoo to wash your face and hair. 2. Pour the CHG onto a clean washcloth. 3. Starting at your neck, lather your body down to your toes. Make sure you follow these instructions: ? If you will be having surgery, pay special attention to the part of your body where you will be having surgery. Scrub this area for at least 1 minute. ? Do not use CHG on your head or face. If the solution gets into your ears or eyes, rinse them well with water. ? Avoid your genital area. ? Avoid any areas of skin that have broken skin, cuts, or scrapes. ? Scrub your back and under your arms. Make sure to wash skin folds. 4. Let the lather sit on your skin for 1-2 minutes or as long as told by your health care provider. 5. Using a different clean, wet washcloth, thoroughly rinse your entire body. Make sure that all body creases and crevices are rinsed well. 6. Dry off with a clean towel. Do not put any substances on your body afterward--such as powder, lotion, or perfume--unless you are told to do so by your health care provider. Only use lotions that are recommended by the manufacturer. 7. Put on clean clothes or pajamas. 8. If it is the night before your surgery, sleep in clean sheets. How to use CHG prepackaged cloths  Only use CHG cloths as told by your health care provider, and follow the instructions on the label.  Use the CHG cloth on clean, dry skin.  Do not use the CHG cloth on your head or face unless your health care provider tells you to.  When washing with the CHG cloth: ? Avoid your genital area. ? Avoid any areas  of skin that have broken skin, cuts, or scrapes. Before surgery Follow these steps when using a CHG cloth to clean before surgery (unless your health care provider gives you different instructions): 1. Using the CHG cloth, vigorously scrub the part of your body where you will be having surgery. Scrub using a back-and-forth motion for 3 minutes. The area on your body should be completely wet with CHG when you are done scrubbing. 2. Do not rinse. Discard the cloth and let the area air-dry. Do not  put any substances on the area afterward, such as powder, lotion, or perfume. 3. Put on clean clothes or pajamas. 4. If it is the night before your surgery, sleep in clean sheets.   For general bathing Follow these steps when using CHG cloths for general bathing (unless your health care provider gives you different instructions). 1. Use a separate CHG cloth for each area of your body. Make sure you wash between any folds of skin and between your fingers and toes. Wash your body in the following order, switching to a new cloth after each step: ? The front of your neck, shoulders, and chest. ? Both of your arms, under your arms, and your hands. ? Your stomach and groin area, avoiding the genitals. ? Your right leg and foot. ? Your left leg and foot. ? The back of your neck, your back, and your buttocks. 2. Do not rinse. Discard the cloth and let the area air-dry. Do not put any substances on your body afterward--such as powder, lotion, or perfume--unless you are told to do so by your health care provider. Only use lotions that are recommended by the manufacturer. 3. Put on clean clothes or pajamas. Contact a health care provider if:  Your skin gets irritated after scrubbing.  You have questions about using your solution or cloth. Get help right away if:  Your eyes become very red or swollen.  Your eyes itch badly.  Your skin itches badly and is red or swollen.  Your hearing changes.  You have  trouble seeing.  You have swelling or tingling in your mouth or throat.  You have trouble breathing.  You swallow any chlorhexidine. Summary  Chlorhexidine gluconate (CHG) is a germ-killing (antiseptic) solution that is used to clean the skin. Cleaning your skin with CHG may help to lower your risk for infection.  You may be given CHG to use for bathing. It may be in a bottle or in a prepackaged cloth to use on your skin. Carefully follow your health care provider's instructions and the instructions on the product label.  Do not use CHG if you have a chlorhexidine allergy.  Contact your health care provider if your skin gets irritated after scrubbing. This information is not intended to replace advice given to you by your health care provider. Make sure you discuss any questions you have with your health care provider. Document Revised: 08/03/2019 Document Reviewed: 08/03/2019 Elsevier Patient Education  2021 [employer].  Transurethral Resection of the Prostate, Care After This sheet gives you information about how to care for yourself after your procedure. Your health care provider may also give you more specific instructions. If you have problems or questions, contact your health care provider. What can I expect after the procedure? After the procedure, it is common to have:  Mild pain in your lower abdomen.  Soreness or mild discomfort in your penis from having the catheter inserted during the procedure.  A feeling of urgency when you need to urinate.  A small amount of blood in your urine. You may notice some small blood clots in your urine. These are normal. Follow these instructions at home: Medicines  Take over-the-counter and prescription medicines only as told by your health care provider.  If you were prescribed an antibiotic medicine, take it as told by your health care provider. Do not stop taking the antibiotic even if you start to feel better.  Ask your health  care provider if the medicine prescribed to you: ?  Requires you to avoid driving or using heavy machinery. ? Can cause constipation. You may need to take actions to prevent or treat constipation, such as:  Take over-the-counter or prescription medicines.  Eat foods that are high in fiber, such as fresh fruits and vegetables, whole grains, and beans.  Limit foods that are high in fat and processed sugars, such as fried or sweet foods.  Do not drive for 24 hours if you were given a sedative during your procedure. Activity  Return to your normal activities as told by your health care provider. Ask your health care provider what activities are safe for you.  Do not lift anything that is heavier than 10 lb (4.5 kg), or the limit that you are told, for 3 weeks after the procedure or until your health care provider says that it is safe.  Avoid intense physical activity for as long as told by your health care provider.  Avoid sitting for a long time without moving. Get up and move around one or more times every few hours. This helps to prevent blood clots. You may increase your physical activity gradually as you start to feel better.   Lifestyle  Do not drink alcohol for as long as told by your health care provider. This is especially important if you are taking prescription pain medicines.  Do not engage in sexual activity until your health care provider says that you can do this. General instructions  Do not take baths, swim, or use a hot tub until your health care provider approves.  Drink enough fluid to keep your urine pale yellow.  Urinate as soon as you feel the need to. Do not try to hold your urine for long periods of time.  If your health care provider approves, you may take a stool softener for 2-3 weeks to prevent you from straining to have a bowel movement.  Wear compression stockings as told by your health care provider. These stockings help to prevent blood clots and reduce  swelling in your legs.  Keep all follow-up visits as told by your health care provider. This is important.   Contact a health care provider if you have:  Difficulty urinating.  A fever.  Pain that gets worse or does not improve with medicine.  Blood in your urine that does not go away after 1 week of resting and drinking more fluids.  Swelling in your penis or testicles. Get help right away if:  You are unable to urinate.  You are having more blood clots in your urine instead of fewer.  You have: ? Large blood clots. ? A lot of blood in your urine. ? Pain in your back or lower abdomen. ? Pain or swelling in your legs. ? Chills and you are shaking. ? Difficulty breathing or shortness of breath. Summary  After the procedure, it is common to have a small amount of blood in your urine.  Avoid heavy lifting and intense physical activity for as long as told by your health care provider.  Urinate as soon as you feel the need to. Do not try to hold your urine for long periods of time.  Keep all follow-up visits as told by your health care provider. This is important. This information is not intended to replace advice given to you by your health care provider. Make sure you discuss any questions you have with your health care provider. Document Revised: 06/07/2018 Document Reviewed: 11/16/2017 Elsevier Patient Education  2021 [employer].  General Anesthesia, Adult, Care After This sheet gives you information about how to care for yourself after your procedure. Your health care provider may also give you more specific instructions. If you have problems or questions, contact your health care provider. What can I expect after the procedure? After the procedure, the following side effects are common:  Pain or discomfort at the IV site.  Nausea.  Vomiting.  Sore throat.  Trouble concentrating.  Feeling cold or chills.  Feeling weak or tired.  Sleepiness and  fatigue.  Soreness and body aches. These side effects can affect parts of the body that were not involved in surgery. Follow these instructions at home: For the time period you were told by your health care provider:  Rest.  Do not participate in activities where you could fall or become injured.  Do not drive or use machinery.  Do not drink alcohol.  Do not take sleeping pills or medicines that cause drowsiness.  Do not make important decisions or sign legal documents.  Do not take care of children on your own.   Eating and drinking  Follow any instructions from your health care provider about eating or drinking restrictions.  When you feel hungry, start by eating small amounts of foods that are soft and easy to digest (bland), such as toast. Gradually return to your regular diet.  Drink enough fluid to keep your urine pale yellow.  If you vomit, rehydrate by drinking water, juice, or clear broth. General instructions  If you have sleep apnea, surgery and certain medicines can increase your risk for breathing problems. Follow instructions from your health care provider about wearing your sleep device: ? Anytime you are sleeping, including during daytime naps. ? While taking prescription pain medicines, sleeping medicines, or medicines that make you drowsy.  Have a responsible adult stay with you for the time you are told. It is important to have someone help care for you until you are awake and alert.  Return to your normal activities as told by your health care provider. Ask your health care provider what activities are safe for you.  Take over-the-counter and prescription medicines only as told by your health care provider.  If you smoke, do not smoke without supervision.  Keep all follow-up visits as told by your health care provider. This is important. Contact a health care provider if:  You have nausea or vomiting that does not get better with medicine.  You  cannot eat or drink without vomiting.  You have pain that does not get better with medicine.  You are unable to pass urine.  You develop a skin rash.  You have a fever.  You have redness around your IV site that gets worse. Get help right away if:  You have difficulty breathing.  You have chest pain.  You have blood in your urine or stool, or you vomit blood. Summary  After the procedure, it is common to have a sore throat or nausea. It is also common to feel tired.  Have a responsible adult stay with you for the time you are told. It is important to have someone help care for you until you are awake and alert.  When you feel hungry, start by eating small amounts of foods that are soft and easy to digest (bland), such as toast. Gradually return to your regular diet.  Drink enough fluid to keep your urine pale yellow.  Return to your normal activities as told by your health care  provider. Ask your health care provider what activities are safe for you. This information is not intended to replace advice given to you by your health care provider. Make sure you discuss any questions you have with your health care provider. Document Revised: 11/01/2019 Document Reviewed: 05/31/2019 Elsevier Patient Education  2021 [employer].

## 2020-06-24 ENCOUNTER — Other Ambulatory Visit: Payer: Self-pay

## 2020-06-24 ENCOUNTER — Other Ambulatory Visit (HOSPITAL_COMMUNITY)
Admission: RE | Admit: 2020-06-24 | Discharge: 2020-06-24 | Disposition: A | Discharge status: Home / Self Care | Payer: Medicare HMO | Source: Ambulatory Visit | Attending: Urology | Admitting: Urology

## 2020-06-24 ENCOUNTER — Telehealth: Payer: Self-pay

## 2020-06-24 ENCOUNTER — Encounter (HOSPITAL_COMMUNITY)
Admission: RE | Admit: 2020-06-24 | Discharge: 2020-06-24 | Disposition: A | Discharge status: Home / Self Care | Payer: Medicare HMO | Source: Ambulatory Visit | Attending: Urology | Admitting: Urology

## 2020-06-24 ENCOUNTER — Encounter (HOSPITAL_COMMUNITY): Payer: Self-pay

## 2020-06-24 DIAGNOSIS — Z20822 Contact with and (suspected) exposure to covid-19: Secondary | ICD-10-CM | POA: Diagnosis not present

## 2020-06-24 DIAGNOSIS — Z01818 Encounter for other preprocedural examination: Secondary | ICD-10-CM | POA: Diagnosis not present

## 2020-06-24 LAB — CBC WITH DIFFERENTIAL/PLATELET
Abs Immature Granulocytes: 0.06 10*3/uL (ref 0.00–0.07)
Basophils Absolute: 0.1 10*3/uL (ref 0.0–0.1)
Basophils Relative: 1 %
Eosinophils Absolute: 0.2 10*3/uL (ref 0.0–0.5)
Eosinophils Relative: 2 %
HCT: 41.9 % (ref 39.0–52.0)
Hemoglobin: 13.8 g/dL (ref 13.0–17.0)
Immature Granulocytes: 1 %
Lymphocytes Relative: 22 %
Lymphs Abs: 2.5 10*3/uL (ref 0.7–4.0)
MCH: 29.7 pg (ref 26.0–34.0)
MCHC: 32.9 g/dL (ref 30.0–36.0)
MCV: 90.1 fL (ref 80.0–100.0)
Monocytes Absolute: 0.8 10*3/uL (ref 0.1–1.0)
Monocytes Relative: 7 %
Neutro Abs: 7.6 10*3/uL (ref 1.7–7.7)
Neutrophils Relative %: 67 %
Platelets: 377 10*3/uL (ref 150–400)
RBC: 4.65 MIL/uL (ref 4.22–5.81)
RDW: 13.3 % (ref 11.5–15.5)
WBC: 11.1 10*3/uL — ABNORMAL HIGH (ref 4.0–10.5)
nRBC: 0 % (ref 0.0–0.2)

## 2020-06-24 LAB — BASIC METABOLIC PANEL
Anion gap: 10 (ref 5–15)
BUN: 22 mg/dL (ref 8–23)
CO2: 25 mmol/L (ref 22–32)
Calcium: 8.5 mg/dL — ABNORMAL LOW (ref 8.9–10.3)
Chloride: 100 mmol/L (ref 98–111)
Creatinine, Ser: 1.61 mg/dL — ABNORMAL HIGH (ref 0.61–1.24)
GFR, Estimated: 42 mL/min — ABNORMAL LOW (ref >60)
Glucose, Bld: 98 mg/dL (ref 70–99)
Potassium: 2.6 mmol/L — CL (ref 3.5–5.1)
Sodium: 135 mmol/L (ref 135–145)

## 2020-06-24 NOTE — Telephone Encounter (Signed)
Received a message Dr. Alyson Ingles unavailable to perform surgery on 04/28 for medical reason-  Called number listed to inform surgery postpone no way to leave message.   Message sent via my chart to call office.

## 2020-06-24 NOTE — Progress Notes (Signed)
Potassium 2.6 - Dr Alyson Ingles notified. KDur 20 meq daily x 5 days called into [facility] in [place]  per order Dr. Alyson Ingles.

## 2020-06-25 ENCOUNTER — Telehealth: Payer: Self-pay

## 2020-06-25 LAB — SARS CORONAVIRUS 2 (TAT 6-24 HRS): SARS Coronavirus 2: NEGATIVE

## 2020-06-25 NOTE — Telephone Encounter (Signed)
I spoke with wife, Rollene Fare, and new surgery date given for May 3rd at [facility].  Patient wife understands new covid appt date for Monday, May 2nd at 8am

## 2020-06-27 ENCOUNTER — Encounter (HOSPITAL_COMMUNITY)
Admission: RE | Admit: 2020-06-27 | Discharge: 2020-06-27 | Disposition: A | Discharge status: Home / Self Care | Payer: Medicare HMO | Source: Ambulatory Visit | Attending: Urology | Admitting: Urology

## 2020-06-27 ENCOUNTER — Other Ambulatory Visit: Payer: Self-pay

## 2020-06-27 NOTE — Pre-Procedure Instructions (Signed)
Attempted to contact patient with  New surgery date and time but his phone, "was not accepting messages at this time".

## 2020-06-28 DIAGNOSIS — G309 Alzheimer's disease, unspecified: Secondary | ICD-10-CM | POA: Diagnosis not present

## 2020-06-28 DIAGNOSIS — R69 Illness, unspecified: Secondary | ICD-10-CM | POA: Diagnosis not present

## 2020-06-28 DIAGNOSIS — I129 Hypertensive chronic kidney disease with stage 1 through stage 4 chronic kidney disease, or unspecified chronic kidney disease: Secondary | ICD-10-CM | POA: Diagnosis not present

## 2020-06-28 DIAGNOSIS — N183 Chronic kidney disease, stage 3 unspecified: Secondary | ICD-10-CM | POA: Diagnosis not present

## 2020-06-30 ENCOUNTER — Telehealth: Payer: Self-pay

## 2020-06-30 ENCOUNTER — Other Ambulatory Visit: Payer: Self-pay

## 2020-06-30 ENCOUNTER — Other Ambulatory Visit (HOSPITAL_COMMUNITY)
Admission: RE | Admit: 2020-06-30 | Discharge: 2020-06-30 | Disposition: A | Discharge status: Home / Self Care | Payer: Medicare HMO | Source: Ambulatory Visit | Attending: Urology | Admitting: Urology

## 2020-06-30 DIAGNOSIS — I10 Essential (primary) hypertension: Secondary | ICD-10-CM | POA: Diagnosis not present

## 2020-06-30 DIAGNOSIS — E039 Hypothyroidism, unspecified: Secondary | ICD-10-CM | POA: Diagnosis not present

## 2020-06-30 DIAGNOSIS — Z20822 Contact with and (suspected) exposure to covid-19: Secondary | ICD-10-CM | POA: Diagnosis not present

## 2020-06-30 DIAGNOSIS — I2510 Atherosclerotic heart disease of native coronary artery without angina pectoris: Secondary | ICD-10-CM | POA: Diagnosis not present

## 2020-06-30 DIAGNOSIS — Z96652 Presence of left artificial knee joint: Secondary | ICD-10-CM | POA: Diagnosis not present

## 2020-06-30 DIAGNOSIS — R69 Illness, unspecified: Secondary | ICD-10-CM | POA: Diagnosis not present

## 2020-06-30 DIAGNOSIS — N138 Other obstructive and reflux uropathy: Secondary | ICD-10-CM | POA: Diagnosis not present

## 2020-06-30 DIAGNOSIS — N401 Enlarged prostate with lower urinary tract symptoms: Secondary | ICD-10-CM | POA: Diagnosis not present

## 2020-06-30 DIAGNOSIS — F0390 Unspecified dementia without behavioral disturbance: Secondary | ICD-10-CM | POA: Diagnosis not present

## 2020-06-30 DIAGNOSIS — Z87891 Personal history of nicotine dependence: Secondary | ICD-10-CM | POA: Diagnosis not present

## 2020-06-30 NOTE — Telephone Encounter (Signed)
Received voicemail from patients wife this am stating he would not be at his covid appt test today due to not sleeping last night.  I attempted to call wife back. No answer- no way to leave message.

## 2020-07-01 ENCOUNTER — Encounter (HOSPITAL_COMMUNITY): Payer: Self-pay | Admitting: Urology

## 2020-07-01 ENCOUNTER — Encounter (HOSPITAL_COMMUNITY)
Admission: RE | Disposition: A | Discharge status: Home / Self Care | Payer: Self-pay | Source: Home / Self Care | Attending: Urology

## 2020-07-01 ENCOUNTER — Observation Stay (HOSPITAL_COMMUNITY)
Admission: RE | Admit: 2020-07-01 | Discharge: 2020-07-02 | Disposition: A | Discharge status: Home / Self Care | Payer: Medicare HMO | Attending: Urology | Admitting: Urology

## 2020-07-01 ENCOUNTER — Ambulatory Visit (HOSPITAL_COMMUNITY): Payer: Medicare HMO | Admitting: Anesthesiology

## 2020-07-01 DIAGNOSIS — Z96652 Presence of left artificial knee joint: Secondary | ICD-10-CM | POA: Insufficient documentation

## 2020-07-01 DIAGNOSIS — Z87891 Personal history of nicotine dependence: Secondary | ICD-10-CM | POA: Insufficient documentation

## 2020-07-01 DIAGNOSIS — E039 Hypothyroidism, unspecified: Secondary | ICD-10-CM | POA: Diagnosis not present

## 2020-07-01 DIAGNOSIS — N138 Other obstructive and reflux uropathy: Secondary | ICD-10-CM | POA: Diagnosis not present

## 2020-07-01 DIAGNOSIS — N3943 Post-void dribbling: Secondary | ICD-10-CM | POA: Diagnosis not present

## 2020-07-01 DIAGNOSIS — F0390 Unspecified dementia without behavioral disturbance: Secondary | ICD-10-CM | POA: Insufficient documentation

## 2020-07-01 DIAGNOSIS — I10 Essential (primary) hypertension: Secondary | ICD-10-CM | POA: Diagnosis not present

## 2020-07-01 DIAGNOSIS — R69 Illness, unspecified: Secondary | ICD-10-CM | POA: Diagnosis not present

## 2020-07-01 DIAGNOSIS — Z20822 Contact with and (suspected) exposure to covid-19: Secondary | ICD-10-CM | POA: Diagnosis not present

## 2020-07-01 DIAGNOSIS — N400 Enlarged prostate without lower urinary tract symptoms: Secondary | ICD-10-CM | POA: Diagnosis not present

## 2020-07-01 DIAGNOSIS — I2510 Atherosclerotic heart disease of native coronary artery without angina pectoris: Secondary | ICD-10-CM | POA: Insufficient documentation

## 2020-07-01 DIAGNOSIS — N401 Enlarged prostate with lower urinary tract symptoms: Principal | ICD-10-CM | POA: Insufficient documentation

## 2020-07-01 HISTORY — PX: TRANSURETHRAL RESECTION OF PROSTATE: SHX73

## 2020-07-01 LAB — BASIC METABOLIC PANEL
Anion gap: 6 (ref 5–15)
BUN: 26 mg/dL — ABNORMAL HIGH (ref 8–23)
CO2: 30 mmol/L (ref 22–32)
Calcium: 8.7 mg/dL — ABNORMAL LOW (ref 8.9–10.3)
Chloride: 102 mmol/L (ref 98–111)
Creatinine, Ser: 1.54 mg/dL — ABNORMAL HIGH (ref 0.61–1.24)
GFR, Estimated: 44 mL/min — ABNORMAL LOW (ref >60)
Glucose, Bld: 105 mg/dL — ABNORMAL HIGH (ref 70–99)
Potassium: 4.2 mmol/L (ref 3.5–5.1)
Sodium: 138 mmol/L (ref 135–145)

## 2020-07-01 LAB — CBC
HCT: 43.2 % (ref 39.0–52.0)
Hemoglobin: 14.1 g/dL (ref 13.0–17.0)
MCH: 30.4 pg (ref 26.0–34.0)
MCHC: 32.6 g/dL (ref 30.0–36.0)
MCV: 93.1 fL (ref 80.0–100.0)
Platelets: 353 10*3/uL (ref 150–400)
RBC: 4.64 MIL/uL (ref 4.22–5.81)
RDW: 13.3 % (ref 11.5–15.5)
WBC: 13.4 10*3/uL — ABNORMAL HIGH (ref 4.0–10.5)
nRBC: 0 % (ref 0.0–0.2)

## 2020-07-01 LAB — POCT I-STAT, CHEM 8
BUN: 28 mg/dL — ABNORMAL HIGH (ref 8–23)
Calcium, Ion: 1.19 mmol/L (ref 1.15–1.40)
Chloride: 101 mmol/L (ref 98–111)
Creatinine, Ser: 1.7 mg/dL — ABNORMAL HIGH (ref 0.61–1.24)
Glucose, Bld: 107 mg/dL — ABNORMAL HIGH (ref 70–99)
HCT: 46 % (ref 39.0–52.0)
Hemoglobin: 15.6 g/dL (ref 13.0–17.0)
Potassium: 3.6 mmol/L (ref 3.5–5.1)
Sodium: 143 mmol/L (ref 135–145)
TCO2: 29 mmol/L (ref 22–32)

## 2020-07-01 LAB — SARS CORONAVIRUS 2 (TAT 6-24 HRS): SARS Coronavirus 2: NEGATIVE

## 2020-07-01 SURGERY — TURP (TRANSURETHRAL RESECTION OF PROSTATE)
Anesthesia: Monitor Anesthesia Care | Site: Prostate

## 2020-07-01 MED ORDER — SODIUM CHLORIDE 0.9 % IR SOLN
Status: DC | PRN
  Administered 2020-07-01 (×8): 3000 mL via INTRAVESICAL

## 2020-07-01 MED ORDER — BUPIVACAINE IN DEXTROSE 0.75-8.25 % IT SOLN
INTRATHECAL | Status: DC | PRN
  Administered 2020-07-01: 1.6 mL via INTRATHECAL

## 2020-07-01 MED ORDER — HYDROCODONE-ACETAMINOPHEN 5-325 MG PO TABS
1.0000 | ORAL_TABLET | ORAL | Status: DC | PRN
  Administered 2020-07-01: 2 via ORAL
  Filled 2020-07-01: qty 2

## 2020-07-01 MED ORDER — BRIMONIDINE TARTRATE 0.2 % OP SOLN
1.0000 [drp] | Freq: Two times a day (BID) | OPHTHALMIC | Status: DC
  Administered 2020-07-01 – 2020-07-02 (×2): 1 [drp] via OPHTHALMIC
  Filled 2020-07-01 (×2): qty 5

## 2020-07-01 MED ORDER — PROPOFOL 10 MG/ML IV BOLUS
INTRAVENOUS | Status: DC | PRN
  Administered 2020-07-01: 10 mg via INTRAVENOUS

## 2020-07-01 MED ORDER — MELATONIN 3 MG PO TABS
9.0000 mg | ORAL_TABLET | Freq: Every day | ORAL | Status: DC
  Administered 2020-07-01: 9 mg via ORAL
  Filled 2020-07-01: qty 3

## 2020-07-01 MED ORDER — LEVOTHYROXINE SODIUM 50 MCG PO TABS
50.0000 ug | ORAL_TABLET | Freq: Every day | ORAL | Status: DC
  Administered 2020-07-02: 50 ug via ORAL
  Filled 2020-07-01: qty 1

## 2020-07-01 MED ORDER — ACETAMINOPHEN 325 MG PO TABS: 650.0000 mg | ORAL_TABLET | ORAL | Status: DC | PRN

## 2020-07-01 MED ORDER — SODIUM CHLORIDE 0.9 % IR SOLN
3000.0000 mL | Status: DC
  Administered 2020-07-01: 3000 mL

## 2020-07-01 MED ORDER — LATANOPROST 0.005 % OP SOLN
1.0000 [drp] | Freq: Every day | OPHTHALMIC | Status: DC
  Administered 2020-07-01: 1 [drp] via OPHTHALMIC
  Filled 2020-07-01: qty 2.5

## 2020-07-01 MED ORDER — FENTANYL CITRATE (PF) 100 MCG/2ML IJ SOLN: 25.0000 ug | INTRAMUSCULAR | Status: DC | PRN

## 2020-07-01 MED ORDER — DONEPEZIL HCL 5 MG PO TABS
10.0000 mg | ORAL_TABLET | Freq: Every day | ORAL | Status: DC
  Administered 2020-07-01 – 2020-07-02 (×2): 10 mg via ORAL
  Filled 2020-07-01 (×2): qty 2

## 2020-07-01 MED ORDER — SODIUM CHLORIDE 0.9 % IV SOLN: INTRAVENOUS | Status: DC

## 2020-07-01 MED ORDER — DORZOLAMIDE HCL 2 % OP SOLN
1.0000 [drp] | Freq: Two times a day (BID) | OPHTHALMIC | Status: DC
  Administered 2020-07-01 – 2020-07-02 (×2): 1 [drp] via OPHTHALMIC
  Filled 2020-07-01 (×2): qty 10

## 2020-07-01 MED ORDER — LACTATED RINGERS IV SOLN
INTRAVENOUS | Status: DC
  Administered 2020-07-01: 1000 mL via INTRAVENOUS

## 2020-07-01 MED ORDER — BELLADONNA ALKALOIDS-OPIUM 16.2-60 MG RE SUPP
1.0000 | Freq: Four times a day (QID) | RECTAL | Status: DC | PRN
  Administered 2020-07-01: 1 via RECTAL
  Filled 2020-07-01 (×2): qty 1

## 2020-07-01 MED ORDER — ONDANSETRON HCL 4 MG/2ML IJ SOLN: 4.0000 mg | INTRAMUSCULAR | Status: DC | PRN

## 2020-07-01 MED ORDER — STERILE WATER FOR IRRIGATION IR SOLN
Status: DC | PRN
  Administered 2020-07-01: 1000 mL

## 2020-07-01 MED ORDER — DIPHENHYDRAMINE HCL 50 MG/ML IJ SOLN: 12.5000 mg | Freq: Four times a day (QID) | INTRAMUSCULAR | Status: DC | PRN

## 2020-07-01 MED ORDER — CHLORHEXIDINE GLUCONATE 0.12 % MT SOLN
15.0000 mL | Freq: Once | OROMUCOSAL | Status: AC
  Administered 2020-07-01: 15 mL via OROMUCOSAL
  Filled 2020-07-01: qty 15

## 2020-07-01 MED ORDER — ZOLPIDEM TARTRATE 5 MG PO TABS: 5.0000 mg | ORAL_TABLET | Freq: Every evening | ORAL | Status: DC | PRN

## 2020-07-01 MED ORDER — FENTANYL CITRATE (PF) 100 MCG/2ML IJ SOLN
INTRAMUSCULAR | Status: DC | PRN
  Administered 2020-07-01: 25 ug via INTRAVENOUS

## 2020-07-01 MED ORDER — FINASTERIDE 5 MG PO TABS
5.0000 mg | ORAL_TABLET | Freq: Every day | ORAL | Status: DC
  Administered 2020-07-01: 5 mg via ORAL
  Filled 2020-07-01: qty 1

## 2020-07-01 MED ORDER — FENTANYL CITRATE (PF) 100 MCG/2ML IJ SOLN
INTRAMUSCULAR | Status: AC
  Filled 2020-07-01: qty 2

## 2020-07-01 MED ORDER — HALOPERIDOL LACTATE 5 MG/ML IJ SOLN
2.0000 mg | Freq: Once | INTRAMUSCULAR | Status: AC
  Administered 2020-07-01: 2 mg via INTRAVENOUS
  Filled 2020-07-01: qty 1

## 2020-07-01 MED ORDER — ORAL CARE MOUTH RINSE: 15.0000 mL | Freq: Once | OROMUCOSAL | Status: AC

## 2020-07-01 MED ORDER — CHLORTHALIDONE 25 MG PO TABS
25.0000 mg | ORAL_TABLET | Freq: Every day | ORAL | Status: DC
  Administered 2020-07-01 – 2020-07-02 (×2): 25 mg via ORAL
  Filled 2020-07-01 (×2): qty 1

## 2020-07-01 MED ORDER — DIPHENHYDRAMINE HCL 12.5 MG/5ML PO ELIX: 12.5000 mg | ORAL_SOLUTION | Freq: Four times a day (QID) | ORAL | Status: DC | PRN

## 2020-07-01 MED ORDER — AMLODIPINE BESYLATE 5 MG PO TABS
2.5000 mg | ORAL_TABLET | Freq: Every day | ORAL | Status: DC
  Administered 2020-07-01: 2.5 mg via ORAL
  Filled 2020-07-01: qty 1

## 2020-07-01 MED ORDER — MEMANTINE HCL 10 MG PO TABS
10.0000 mg | ORAL_TABLET | Freq: Two times a day (BID) | ORAL | Status: DC
  Administered 2020-07-01 – 2020-07-02 (×3): 10 mg via ORAL
  Filled 2020-07-01 (×3): qty 1

## 2020-07-01 MED ORDER — SODIUM CHLORIDE 0.9 % IV SOLN
2.0000 g | INTRAVENOUS | Status: AC
  Administered 2020-07-01: 2 g via INTRAVENOUS
  Filled 2020-07-01: qty 20

## 2020-07-01 MED ORDER — CHLORHEXIDINE GLUCONATE CLOTH 2 % EX PADS
6.0000 | MEDICATED_PAD | Freq: Every day | CUTANEOUS | Status: DC
  Administered 2020-07-02: 6 via TOPICAL

## 2020-07-01 MED ORDER — PROPOFOL 10 MG/ML IV BOLUS
INTRAVENOUS | Status: AC
  Filled 2020-07-01: qty 40

## 2020-07-01 MED ORDER — DOXEPIN HCL 25 MG PO CAPS
25.0000 mg | ORAL_CAPSULE | Freq: Every day | ORAL | Status: DC
  Administered 2020-07-01: 25 mg via ORAL
  Filled 2020-07-01: qty 1

## 2020-07-01 MED ORDER — POTASSIUM CHLORIDE CRYS ER 10 MEQ PO TBCR
10.0000 meq | EXTENDED_RELEASE_TABLET | Freq: Every day | ORAL | Status: DC
  Administered 2020-07-01 – 2020-07-02 (×2): 10 meq via ORAL
  Filled 2020-07-01 (×2): qty 1

## 2020-07-01 SURGICAL SUPPLY — 24 items
BAG DRAIN URO TABLE W/ADPT NS (BAG) ×3 IMPLANT
BAG HAMPER (MISCELLANEOUS) ×3 IMPLANT
BAG URINE DRAIN TURP 4L (OSTOMY) IMPLANT
CATH FOLEY 3WAY 30CC 22F (CATHETERS) ×3 IMPLANT
CLOTH BEACON ORANGE TIMEOUT ST (SAFETY) ×3 IMPLANT
ELECT REM PT RETURN 9FT ADLT (ELECTROSURGICAL) ×3
ELECTRODE REM PT RTRN 9FT ADLT (ELECTROSURGICAL) ×2 IMPLANT
GLOVE BIO SURGEON STRL SZ8 (GLOVE) ×3 IMPLANT
GLOVE SURG UNDER POLY LF SZ7 (GLOVE) ×6 IMPLANT
GOWN STRL REUS W/TWL LRG LVL3 (GOWN DISPOSABLE) ×3 IMPLANT
GOWN STRL REUS W/TWL XL LVL3 (GOWN DISPOSABLE) ×3 IMPLANT
IV NS IRRIG 3000ML ARTHROMATIC (IV SOLUTION) ×24 IMPLANT
KIT TURNOVER CYSTO (KITS) ×3 IMPLANT
LOOP CUT BIPOLAR 24F LRG (ELECTROSURGICAL) ×3 IMPLANT
MANIFOLD NEPTUNE II (INSTRUMENTS) ×3 IMPLANT
NEEDLE HYPO 18GX1.5 BLUNT FILL (NEEDLE) ×3 IMPLANT
PACK CYSTO (CUSTOM PROCEDURE TRAY) ×3 IMPLANT
PAD ARMBOARD 7.5X6 YLW CONV (MISCELLANEOUS) ×6 IMPLANT
PAD TELFA 3X4 1S STER (GAUZE/BANDAGES/DRESSINGS) ×3 IMPLANT
SYR 30ML LL (SYRINGE) ×3 IMPLANT
SYR TOOMEY IRRIG 70ML (MISCELLANEOUS) ×3
SYRINGE TOOMEY IRRIG 70ML (MISCELLANEOUS) ×2 IMPLANT
TOWEL OR 17X26 4PK STRL BLUE (TOWEL DISPOSABLE) ×3 IMPLANT
WATER STERILE IRR 1000ML POUR (IV SOLUTION) ×3 IMPLANT

## 2020-07-01 NOTE — H&P (Signed)
Urology Admission H&P  Chief Complaint: BPH  History of Present Illness: [name] is a 87yo here for TURP. He has BPH that has failed medical therapy and Urolift. He has urinary retention  Past Medical History:  Diagnosis Date  . BPH (benign prostatic hypertrophy)   . Coronary artery disease cardiologist-  [name] (previously dr Domenic Polite)   abnormal stress test 11-07-2015 ; s/p  cardiac cath 11-27-2015 , non-obstructive cad and normal LVF (pLAD to mLAD 20%; mCx 20%)  . Dementia ([place])    per wife he has dementia but has not been dx by a md  . Essential hypertension, benign   . Glaucoma, both eyes   . Hypothyroidism   . Loose bowel movements   . Lower urinary tract symptoms (LUTS)   . Mixed hyperlipidemia   . OA (osteoarthritis)   . Personal history of MI (myocardial infarction)    per stress test 11-07-2015 large fixed inferior defect consistent w/ old infarction  . PVC's (premature ventricular contractions)   . RBBB (right bundle branch block)   . Wears glasses    Past Surgical History:  Procedure Laterality Date  . APPENDECTOMY  1948  . CARDIAC CATHETERIZATION N/A 11/27/2015   Procedure: Left Heart Cath and Coronary Angiography;  Surgeon: Burnell Blanks, MD;  Location: [facility];  Service: Cardiovascular;  Laterality: N/A;   MILD NON-OBSTRUCTIVE CAD, NORMAL LVSF, EF 55-65%  . CARDIAC CATHETERIZATION  08-23-2007   dr cooper   essentially normal coronary arteries w/ no obstructive cad, normal lvf, ef 65%  . CARDIOVASCULAR STRESS TEST  11-07-2015   [facility], [place]   Intermediate risk nuclear study w/ large fixed inferior defect noted consistent with old infarction, mild reversibility in the inferior wall concerning for ischemia/  normal LV function and wall motion , ef 57%  . CATARACT EXTRACTION W/ INTRAOCULAR LENS  IMPLANT, BILATERAL  2015  approx.  . CYSTOSCOPY WITH BIOPSY N/A 05/02/2017   Procedure: CYSTOSCOPY WITH BLADDER BIOPSY AND FULGURATION;   Surgeon: Cleon Gustin, MD;  Location: [facility];  Service: Urology;  Laterality: N/A;  . CYSTOSCOPY WITH INSERTION OF UROLIFT N/A 01/31/2017   Procedure: CYSTOSCOPY WITH INSERTION OF UROLIFT;  Surgeon: Cleon Gustin, MD;  Location: [facility];  Service: Urology;  Laterality: N/A;  . CYSTOSCOPY WITH INSERTION OF UROLIFT N/A 05/02/2017   Procedure: CYSTOSCOPY WITH INSERTION OF UROLIFT;  Surgeon: Cleon Gustin, MD;  Location: [facility];  Service: Urology;  Laterality: N/A;  . ERCP N/A 04/04/2017   Procedure: ENDOSCOPIC RETROGRADE CHOLANGIOPANCREATOGRAPHY (ERCP);  Surgeon: Rogene Houston, MD;  [facility];  Service: Endoscopy;  Laterality: N/A;  . KNEE ARTHROPLASTY Left 02/19/2016   Procedure: LEFT TOTAL KNEE ARTHROPLASTY WITH COMPUTER NAVIGATION;  Surgeon: Rod Can, MD;  Location: [facility];  Service: Orthopedics;  Laterality: Left;  . STENT REMOVAL  04/04/2017   Procedure: BILIARY STENT REMOVAL;  Surgeon: Rogene Houston, MD;  [facility];  Service: Endoscopy;;  . TONSILLECTOMY  child    Home Medications:  Current Facility-Administered Medications  Medication Dose Route Frequency Provider Last Rate Last Admin  . cefTRIAXone (ROCEPHIN) 2 g in sodium chloride 0.9 % 100 mL IVPB  2 g Intravenous 30 min Pre-Op Alizeh Madril, Candee Furbish, MD      . lactated ringers infusion   Intravenous Continuous Battula, Rajamani C, MD 50 mL/hr at 07/01/20 0700 1,000 mL at 07/01/20 0700   Allergies: No Known Allergies  Family  History  Problem Relation Age of Onset  . Colon cancer Mother   . Other Father        MVA   Social History:  reports that he quit smoking about 60 years ago. His smoking use included cigarettes. He has a 30.00 pack-year smoking history. He has never used smokeless tobacco. He reports that he does not drink alcohol and does not use drugs.  Review of Systems  Genitourinary: Positive for difficulty urinating.  All other systems reviewed and  are negative.   Physical Exam:  Vital signs in last 24 hours: Temp:  [98 F (36.7 C)] 98 F (36.7 C) (05/03 0652) Pulse Rate:  [54] 54 (05/03 0652) Resp:  [20] 20 (05/03 0652) BP: (159)/(85) 159/85 (05/03 0652) SpO2:  [99 %] 99 % (05/03 9509) Physical Exam Constitutional:      Appearance: Normal appearance.  HENT:     Head: Normocephalic and atraumatic.     Nose: Nose normal.     Mouth/Throat:     Mouth: Mucous membranes are dry.  Eyes:     Extraocular Movements: Extraocular movements intact.     Pupils: Pupils are equal, round, and reactive to light.  Cardiovascular:     Rate and Rhythm: Normal rate and regular rhythm.  Pulmonary:     Effort: Pulmonary effort is normal. No respiratory distress.  Abdominal:     General: Abdomen is flat. There is no distension.  Musculoskeletal:        General: No swelling. Normal range of motion.     Cervical back: Normal range of motion and neck supple.  Skin:    General: Skin is warm and dry.  Neurological:     General: No focal deficit present.     Mental Status: He is alert and oriented to person, place, and time.  Psychiatric:        Mood and Affect: Mood normal.        Behavior: Behavior normal.        Thought Content: Thought content normal.        Judgment: Judgment normal.     Laboratory Data:  Results for orders placed or performed during the hospital encounter of 07/01/20 (from the past 24 hour(s))  SARS CORONAVIRUS 2 (TAT 6-24 HRS) Nasopharyngeal Nasopharyngeal Swab     Status: None   Collection Time: 06/30/20 10:59 AM   Specimen: Nasopharyngeal Swab  Result Value Ref Range   SARS Coronavirus 2 NEGATIVE NEGATIVE  I-STAT, chem 8     Status: Abnormal   Collection Time: 07/01/20  6:44 AM  Result Value Ref Range   Sodium 143 135 - 145 mmol/L   Potassium 3.6 3.5 - 5.1 mmol/L   Chloride 101 98 - 111 mmol/L   BUN 28 (H) 8 - 23 mg/dL   Creatinine, Ser 1.70 (H) 0.61 - 1.24 mg/dL   Glucose, Bld 107 (H) 70 - 99 mg/dL    Calcium, Ion 1.19 1.15 - 1.40 mmol/L   TCO2 29 22 - 32 mmol/L   Hemoglobin 15.6 13.0 - 17.0 g/dL   HCT 46.0 39.0 - 52.0 %   Recent Results (from the past 240 hour(s))  SARS CORONAVIRUS 2 (TAT 6-24 HRS) Nasopharyngeal Nasopharyngeal Swab     Status: None   Collection Time: 06/24/20  1:00 PM   Specimen: Nasopharyngeal Swab  Result Value Ref Range Status   SARS Coronavirus 2 NEGATIVE NEGATIVE Final    Comment: (NOTE) SARS-CoV-2 target nucleic acids are NOT DETECTED.  The SARS-CoV-2  RNA is generally detectable in upper and lower respiratory specimens during the acute phase of infection. Negative results do not preclude SARS-CoV-2 infection, do not rule out co-infections with other pathogens, and should not be used as the sole basis for treatment or other patient management decisions. Negative results must be combined with clinical observations, patient history, and epidemiological information. The expected result is Negative.  Fact Sheet for Patients: SugarRoll.be  Fact Sheet for Healthcare Providers: https://www.woods-mathews.com/  This test is not yet approved or cleared by the [place] FDA and  has been authorized for detection and/or diagnosis of SARS-CoV-2 by FDA under an Emergency Use Authorization (EUA). This EUA will remain  in effect (meaning this test can be used) for the duration of the COVID-19 declaration under Se ction 564(b)(1) of the Act, 21 U.S.C. section 360bbb-3(b)(1), unless the authorization is terminated or revoked sooner.  Performed at [facility], [place] [address], [place], [place] [address]   SARS CORONAVIRUS 2 (TAT 6-24 HRS) Nasopharyngeal Nasopharyngeal Swab     Status: None   Collection Time: 06/30/20 10:59 AM   Specimen: Nasopharyngeal Swab  Result Value Ref Range Status   SARS Coronavirus 2 NEGATIVE NEGATIVE Final    Comment: (NOTE) SARS-CoV-2 target nucleic acids are NOT DETECTED.  The  SARS-CoV-2 RNA is generally detectable in upper and lower respiratory specimens during the acute phase of infection. Negative results do not preclude SARS-CoV-2 infection, do not rule out co-infections with other pathogens, and should not be used as the sole basis for treatment or other patient management decisions. Negative results must be combined with clinical observations, patient history, and epidemiological information. The expected result is Negative.  Fact Sheet for Patients: SugarRoll.be  Fact Sheet for Healthcare Providers: https://www.woods-mathews.com/  This test is not yet approved or cleared by the [place] FDA and  has been authorized for detection and/or diagnosis of SARS-CoV-2 by FDA under an Emergency Use Authorization (EUA). This EUA will remain  in effect (meaning this test can be used) for the duration of the COVID-19 declaration under Se ction 564(b)(1) of the Act, 21 U.S.C. section 360bbb-3(b)(1), unless the authorization is terminated or revoked sooner.  Performed at [facility], [place] [address] [place], [place] [address]    Creatinine: Recent Labs    06/24/20 1401 07/01/20 0644  CREATININE 1.61* 1.70*   Baseline Creatinine: 1.6  Impression/Assessment:  87yo with BPH  Plan:  We discussed the management of his BPH including continued medical therapy, Rezum, Urolift, TURP and simple prostatectomy. After discussing the options the patient has elected to proceed with TURP. Risks/benefits/alternatives discussed.   Nicolette Bang 07/01/2020, 7:31 AM

## 2020-07-01 NOTE — Transfer of Care (Signed)
Immediate Anesthesia Transfer of Care Note  Patient: Kurt Tigert Calzada Sr.  Procedure(s) Performed: CYSTOSCOPY WITH BIOPSY (N/A Prostate) TRANSURETHRAL RESECTION OF THE PROSTATE (TURP) (N/A Prostate)  Patient Location: PACU  Anesthesia Type:Spinal  Level of Consciousness: awake, alert  and patient cooperative  Airway & Oxygen Therapy: Patient Spontanous Breathing and Patient connected to nasal cannula oxygen  Post-op Assessment: Report given to RN and Post -op Vital signs reviewed and stable  Post vital signs: Reviewed and stable  Last Vitals:  Vitals Value Taken Time  BP 124/72 07/01/20 0926  Temp    Pulse 52 07/01/20 0929  Resp 16 07/01/20 0929  SpO2 100 % 07/01/20 0929  Vitals shown include unvalidated device data.  Last Pain:  Vitals:   07/01/20 0652  TempSrc: Oral  PainSc: 0-No pain      Patients Stated Pain Goal: 6 (53/97/67 3419)  Complications: No complications documented.

## 2020-07-01 NOTE — Anesthesia Postprocedure Evaluation (Signed)
Anesthesia Post Note  Patient: Kurt French.  Procedure(s) Performed: TRANSURETHRAL RESECTION OF THE PROSTATE (TURP) (N/A Prostate)  Patient location during evaluation: PACU Anesthesia Type: MAC and Spinal Level of consciousness: awake and alert Pain management: pain level controlled Vital Signs Assessment: post-procedure vital signs reviewed and stable Respiratory status: spontaneous breathing and respiratory function stable Cardiovascular status: blood pressure returned to baseline and stable Postop Assessment: no apparent nausea or vomiting Anesthetic complications: no   No complications documented.   Last Vitals:  Vitals:   07/01/20 1030 07/01/20 1045  BP:  (!) 173/84  Pulse: (!) 52 (!) 52  Resp: 18 (!) 24  Temp:    SpO2: 100% 100%    Last Pain:  Vitals:   07/01/20 1045  TempSrc:   PainSc: 0-No pain                 Gehrig Patras C Carinna Newhart

## 2020-07-01 NOTE — Anesthesia Preprocedure Evaluation (Signed)
Anesthesia Evaluation  Patient identified by MRN, date of birth, ID band Patient awake and Patient confused    Reviewed: Allergy & Precautions, NPO status , Patient's Chart, lab work & pertinent test results  Airway Mallampati: III  TM Distance: >3 FB Neck ROM: Full    Dental  (+) Dental Advisory Given, Chipped, Missing, Loose   Pulmonary shortness of breath and with exertion, former smoker,    Pulmonary exam normal breath sounds clear to auscultation       Cardiovascular Exercise Tolerance: Good hypertension, Pt. on medications + CAD and + Past MI  Normal cardiovascular exam+ dysrhythmias  Rhythm:Regular Rate:Normal  24-Jun-2020 12:57:52 [facility] ROUTINE RECORD 1933-01-26 (60 yr) Male Caucasian Room: Loc:905 Technician: Test ind: Vent. rate 63 BPM PR interval 202 ms QRS duration 124 ms QT/QTcB 440/450 ms P-R-T axes 12 -52 14 Normal sinus rhythm Left anterior fascicular block Abnormal ECG Poor R wave progression Confirmed by Asencion Noble [phone]) on 06/24/2020 7:40:24 PM   Neuro/Psych PSYCHIATRIC DISORDERS Dementia    GI/Hepatic negative GI ROS, Neg liver ROS,   Endo/Other  Hypothyroidism   Renal/GU negative Renal ROS     Musculoskeletal  (+) Arthritis ,   Abdominal   Peds  Hematology negative hematology ROS (+)   Anesthesia Other Findings   Reproductive/Obstetrics                          Anesthesia Physical Anesthesia Plan  ASA: III  Anesthesia Plan: MAC and Spinal   Post-op Pain Management:    Induction: Intravenous  PONV Risk Score and Plan: 4 or greater and Ondansetron and Dexamethasone  Airway Management Planned: Nasal Cannula and Natural Airway  Additional Equipment:   Intra-op Plan:   Post-operative Plan:   Informed Consent: I have reviewed the patients History and Physical, chart, labs and discussed the procedure including the risks,  benefits and alternatives for the proposed anesthesia with the patient or authorized representative who has indicated his/her understanding and acceptance.     Dental advisory given  Plan Discussed with: CRNA and Surgeon  Anesthesia Plan Comments: (Possible GA with airway was explained to the wife.)       Anesthesia Quick Evaluation

## 2020-07-01 NOTE — Anesthesia Procedure Notes (Signed)
Spinal  Patient location during procedure: OR Start time: 07/01/2020 7:48 AM End time: 07/01/2020 7:54 AM Reason for block: surgical anesthesia Staffing Performed: anesthesiologist  Anesthesiologist: Denese Killings, MD Preanesthetic Checklist Completed: patient identified, IV checked, site marked, risks and benefits discussed, surgical consent, monitors and equipment checked, pre-op evaluation and timeout performed Spinal Block Patient position: sitting Prep: DuraPrep Patient monitoring: heart rate, cardiac monitor, continuous pulse ox and blood pressure Approach: midline Location: L3-4 Injection technique: single-shot Needle Needle type: Sprotte and Spinocan  Needle gauge: 24 G Needle length: 9 cm Needle insertion depth: 6 cm Assessment Events: CSF return

## 2020-07-01 NOTE — Op Note (Signed)
Preoperative diagnosis: BPH  Postoperative diagnosis: BPH  Procedure: 1 cystoscopy 2. Transurethral resection of the prostate  Attending: Nicolette Bang  Anesthesia: General  Estimated blood loss: Minimal  Drains: 22 French foley  Specimens: 1. Prostate Chips  Antibiotics: Rocephin  Findings: Trilobar prostate enlargement. No bladder lesions identified. Ureteral orifices in normal anatomic location.   Indications: Patient is a 87 year old male with a history of BPH and elevated PVR.  After discussing treatment options, they decided proceed with transurethral resection of the prostate.  Procedure her in detail: The patient was brought to the operating room and a brief timeout was done to ensure correct patient, correct procedure, correct site.  General anesthesia was administered patient was placed in dorsal lithotomy position.  Their genitalia was then prepped and draped in usual sterile fashion.  A rigid 33 French cystoscope was passed in the urethra and the bladder.  Bladder was inspected and we noted no masses or lesions.  the ureteral orifices were in the normal orthotopic locations. removed the cystoscope and placed a resectoscope into the bladder. We then turned our attention to the prostate resection. Using the bipolar resectoscope we resected the median lobe first from the bladder neck to the verumontanum. We then started at the 12 oclock position on the left lobe and resection to the 6 o'clock position from the bladder neck to the verumontanum. We then did the same resection of the right lobe. Once the resection was complete we then cauterized individual bleeders. We then removed the prostate chips and sent them for pathology.  We then re-inspected the prostatic fossa and found no residual bleeding.  the bladder was then drained, a 22 French foley was placed and this concluded the procedure which was well tolerated by patient.  Complications: None  Condition: Stable, extubated,  transferred to PACU  Plan: Patient is admitted overnight with continuous bladder irrigation. If their urine is clear tomorrow they will be discharged home and followup in 5 days for foley catheter removal and pathology discussion.

## 2020-07-02 ENCOUNTER — Ambulatory Visit: Payer: Medicare HMO | Admitting: Urology

## 2020-07-02 ENCOUNTER — Encounter (HOSPITAL_COMMUNITY): Payer: Self-pay | Admitting: Urology

## 2020-07-02 ENCOUNTER — Telehealth: Payer: Self-pay

## 2020-07-02 ENCOUNTER — Encounter: Payer: Self-pay | Admitting: Urology

## 2020-07-02 DIAGNOSIS — Z96652 Presence of left artificial knee joint: Secondary | ICD-10-CM | POA: Diagnosis not present

## 2020-07-02 DIAGNOSIS — E039 Hypothyroidism, unspecified: Secondary | ICD-10-CM | POA: Diagnosis not present

## 2020-07-02 DIAGNOSIS — R69 Illness, unspecified: Secondary | ICD-10-CM | POA: Diagnosis not present

## 2020-07-02 DIAGNOSIS — Z20822 Contact with and (suspected) exposure to covid-19: Secondary | ICD-10-CM | POA: Diagnosis not present

## 2020-07-02 DIAGNOSIS — I10 Essential (primary) hypertension: Secondary | ICD-10-CM | POA: Diagnosis not present

## 2020-07-02 DIAGNOSIS — Z87891 Personal history of nicotine dependence: Secondary | ICD-10-CM | POA: Diagnosis not present

## 2020-07-02 DIAGNOSIS — I2510 Atherosclerotic heart disease of native coronary artery without angina pectoris: Secondary | ICD-10-CM | POA: Diagnosis not present

## 2020-07-02 DIAGNOSIS — N401 Enlarged prostate with lower urinary tract symptoms: Secondary | ICD-10-CM | POA: Diagnosis not present

## 2020-07-02 DIAGNOSIS — N138 Other obstructive and reflux uropathy: Secondary | ICD-10-CM | POA: Diagnosis not present

## 2020-07-02 LAB — BASIC METABOLIC PANEL
Anion gap: 7 (ref 5–15)
BUN: 30 mg/dL — ABNORMAL HIGH (ref 8–23)
CO2: 29 mmol/L (ref 22–32)
Calcium: 8.7 mg/dL — ABNORMAL LOW (ref 8.9–10.3)
Chloride: 103 mmol/L (ref 98–111)
Creatinine, Ser: 1.77 mg/dL — ABNORMAL HIGH (ref 0.61–1.24)
GFR, Estimated: 37 mL/min — ABNORMAL LOW (ref >60)
Glucose, Bld: 124 mg/dL — ABNORMAL HIGH (ref 70–99)
Potassium: 4 mmol/L (ref 3.5–5.1)
Sodium: 139 mmol/L (ref 135–145)

## 2020-07-02 LAB — CBC
HCT: 39.5 % (ref 39.0–52.0)
Hemoglobin: 13 g/dL (ref 13.0–17.0)
MCH: 30.4 pg (ref 26.0–34.0)
MCHC: 32.9 g/dL (ref 30.0–36.0)
MCV: 92.3 fL (ref 80.0–100.0)
Platelets: 334 10*3/uL (ref 150–400)
RBC: 4.28 MIL/uL (ref 4.22–5.81)
RDW: 13.1 % (ref 11.5–15.5)
WBC: 22 10*3/uL — ABNORMAL HIGH (ref 4.0–10.5)
nRBC: 0 % (ref 0.0–0.2)

## 2020-07-02 LAB — SURGICAL PATHOLOGY

## 2020-07-02 NOTE — Discharge Instructions (Signed)
Indwelling Urinary Catheter Care, Adult °An indwelling urinary catheter is a thin tube that is put into your bladder. The tube helps to drain pee (urine) out of your body. The tube goes in through your urethra. Your urethra is where pee comes out of your body. Your pee will come out through the catheter, then it will go into a bag (drainage bag). °Take good care of your catheter so it will work well. °How to wear your catheter and bag °Supplies needed °· Sticky tape (adhesive tape) or a leg strap. °· Alcohol wipe or soap and water (if you use tape). °· A clean towel (if you use tape). °· Large overnight bag. °· Smaller bag (leg bag). °Wearing your catheter °Attach your catheter to your leg with tape or a leg strap. °· Make sure the catheter is not pulled tight. °· If a leg strap gets wet, take it off and put on a dry strap. °· If you use tape to hold the bag on your leg: °1. Use an alcohol wipe or soap and water to wash your skin where the tape made it sticky before. °2. Use a clean towel to pat-dry that skin. °3. Use new tape to make the bag stay on your leg. °Wearing your bags °You should have been given a large overnight bag. °· You may wear the overnight bag in the day or night. °· Always have the overnight bag lower than your bladder.  Do not let the bag touch the floor. °· Before you go to sleep, put a clean plastic bag in a wastebasket. Then hang the overnight bag inside the wastebasket. °You should also have a smaller leg bag that fits under your clothes. °· Always wear the leg bag below your knee. °· Do not wear your leg bag at night. °How to care for your skin and catheter °Supplies needed °· A clean washcloth. °· Water and mild soap. °· A clean towel. °Caring for your skin and catheter °· Clean the skin around your catheter every day: °1. Wash your hands with soap and water. °2. Wet a clean washcloth in warm water and mild soap. °3. Clean the skin around your urethra. °§ If you are male: °§ Gently  spread the folds of skin around your vagina (labia). °§ With the washcloth in your other hand, wipe the inner side of your labia on each side. Wipe from front to back. °§ If you are male: °§ Pull back any skin that covers the end of your penis (foreskin). °§ With the washcloth in your other hand, wipe your penis in small circles. Start wiping at the tip of your penis, then move away from the catheter. °§ Move the foreskin back in place, if needed. °4. With your free hand, hold the catheter close to where it goes into your body. °§ Keep holding the catheter during cleaning so it does not get pulled out. °5. With the washcloth in your other hand, clean the catheter. °§ Only wipe downward on the catheter. °§ Do not wipe upward toward your body. Doing this may push germs into your urethra and cause infection. °6. Use a clean towel to pat-dry the catheter and the skin around it. Make sure to wipe off all soap. °7. Wash your hands with soap and water. °· Shower every day. Do not take baths. °· Do not use cream, ointment, or lotion on the area where the catheter goes into your body, unless your doctor tells you to. °· Do not   use powders, sprays, or lotions on your genital area. °· Check your skin around the catheter every day for signs of infection. Check for: °? Redness, swelling, or pain. °? Fluid or blood. °? Warmth. °? Pus or a bad smell.  °  °  °How to empty the bag °Supplies needed °· Rubbing alcohol. °· Gauze pad or cotton ball. °· Tape or a leg strap. °Emptying the bag °Pour the pee out of your bag when it is ?-½ full, or at least 2-3 times a day. Do this for your overnight bag and your leg bag. °1. Wash your hands with soap and water. °2. Separate (detach) the bag from your leg. °3. Hold the bag over the toilet or a clean pail. Keep the bag lower than your hips and bladder. This is so the pee (urine) does not go back into the tube. °4. Open the pour spout. It is at the bottom of the bag. °5. Empty the pee into the  toilet or pail. Do not let the pour spout touch any surface. °6. Put rubbing alcohol on a gauze pad or cotton ball. °7. Use the gauze pad or cotton ball to clean the pour spout. °8. Close the pour spout. °9. Attach the bag to your leg with tape or a leg strap. °10. Wash your hands with soap and water. °Follow instructions for cleaning the drainage bag: °· From the product maker. °· As told by your doctor. °How to change the bag °Supplies needed °· Alcohol wipes. °· A clean bag. °· Tape or a leg strap. °Changing the bag °Replace your bag when it starts to leak, smell bad, or look dirty. °1. Wash your hands with soap and water. °2. Separate the dirty bag from your leg. °3. Pinch the catheter with your fingers so that pee does not spill out. °4. Separate the catheter tube from the bag tube where these tubes connect (at the connection valve). Do not let the tubes touch any surface. °5. Clean the end of the catheter tube with an alcohol wipe. Use a different alcohol wipe to clean the end of the bag tube. °6. Connect the catheter tube to the tube of the clean bag. °7. Attach the clean bag to your leg with tape or a leg strap. Do not make the bag tight on your leg. °8. Wash your hands with soap and water. °General rules °· Never pull on your catheter. Never try to take it out. Doing that can hurt you. °· Always wash your hands before and after you touch your catheter or bag. Use a mild, fragrance-free soap. If you do not have soap and water, use hand sanitizer. °· Always make sure there are no twists or bends (kinks) in the catheter tube. °· Always make sure there are no leaks in the catheter or bag. °· Drink enough fluid to keep your pee pale yellow. °· Do not take baths, swim, or use a hot tub. °· If you are male, wipe from front to back after you poop (have a bowel movement).   °Contact a doctor if: °· Your pee is cloudy. °· Your pee smells worse than usual. °· Your catheter gets clogged. °· Your catheter  leaks. °· Your bladder feels full. °Get help right away if: °· You have redness, swelling, or pain where the catheter goes into your body. °· You have fluid, blood, pus, or a bad smell coming from the area where the catheter goes into your body. °· Your skin feels   warm where the catheter goes into your body. °· You have a fever. °· You have pain in your: °? Belly (abdomen). °? Legs. °? Lower back. °? Bladder. °· You see blood in the catheter. °· Your pee is pink or red. °· You feel sick to your stomach (nauseous). °· You throw up (vomit). °· You have chills. °· Your pee is not draining into the bag. °· Your catheter gets pulled out. °Summary °· An indwelling urinary catheter is a thin tube that is placed into the bladder to help drain pee (urine) out of the body. °· The catheter is placed into the part of the body that drains pee from the bladder (urethra). °· Taking good care of your catheter will keep it working properly and help prevent problems. °· Always wash your hands before and after touching your catheter or bag. °· Never pull on your catheter or try to take it out. °This information is not intended to replace advice given to you by your health care provider. Make sure you discuss any questions you have with your health care provider. °Document Revised: 06/09/2018 Document Reviewed: 10/01/2016 °Elsevier Patient Education © 2021 [employer] ° °

## 2020-07-02 NOTE — Telephone Encounter (Signed)
Returned a call to patient wife from a voice mail-  Kurt French was concerned about Kurt French recent blood work with WBC elevation after surgery and concern for infection. I spoke with Dr. Alyson Ingles about elevation and he stated WBC elevation after TURP is expected. I relayed information to Kurt French and still concerned patient needed antibiotics.  I did review patient pre-op medications and informed Kurt French patient was given Rocephin IV in pre-op day of surgery.

## 2020-07-02 NOTE — Progress Notes (Signed)
Patient alert with confusion. Was very restless and anxious beginning of shift. Was climbing out of bed, pulled out IV and tagging on indwelling catheter. MD notified and Haldol ordered and administered. Pt calmed down and went to sleep. Took the rest of his medication. Daughter  At bedside. Continues on continuos bladder irrigation, getting less bloody as shift progresses. Will continue to monitor.

## 2020-07-02 NOTE — Progress Notes (Signed)
Nsg Discharge Note  Admit Date:  07/01/2020 Discharge date: 07/02/2020   Kurt Roys Wienke Sr. to be D/C'd Home per MD order.  AVS completed.  Copy for chart, and copy for patient signed, and dated. Patient/caregiver able to verbalize understanding.  Discharge Medication: Allergies as of 07/02/2020   No Known Allergies     Medication List    STOP taking these medications   tamsulosin 0.4 MG Caps capsule Commonly known as: FLOMAX     TAKE these medications   amLODipine 2.5 MG tablet Commonly known as: NORVASC Take 2.5 mg by mouth at bedtime.   brimonidine 0.2 % ophthalmic solution Commonly known as: ALPHAGAN Place 1 drop into the left eye 2 (two) times daily.   chlorthalidone 25 MG tablet Commonly known as: HYGROTON Take 25 mg by mouth daily.   diphenhydrAMINE 25 MG tablet Commonly known as: BENADRYL Take 25-50 mg by mouth every 6 (six) hours as needed for allergies.   donepezil 10 MG tablet Commonly known as: ARICEPT Take 10 mg by mouth daily.   dorzolamide 2 % ophthalmic solution Commonly known as: TRUSOPT Place 1 drop into both eyes 2 (two) times daily.   doxepin 25 MG capsule Commonly known as: SINEQUAN Take 25 mg by mouth at bedtime.   finasteride 5 MG tablet Commonly known as: PROSCAR Take 1 tablet by mouth once daily What changed: when to take this   Klor-Con M10 10 MEQ tablet Generic drug: potassium chloride TAKE 1 TABLET BY MOUTH ONCE DAILY What changed: how much to take   latanoprost 0.005 % ophthalmic solution Commonly known as: XALATAN Place 1 drop into both eyes at bedtime.   levothyroxine 50 MCG tablet Commonly known as: SYNTHROID Take 50 mcg by mouth daily before breakfast.   Melatonin 10 MG Caps Take 10 mg by mouth at bedtime.   memantine 10 MG tablet Commonly known as: NAMENDA Take 10 mg by mouth 2 (two) times daily.   nitrofurantoin (macrocrystal-monohydrate) 100 MG capsule Commonly known as: MACROBID Take 1 capsule (100 mg total)  by mouth every 12 (twelve) hours.   Probiotic Caps Take 1 capsule by mouth daily.   VISION FORMULA PO Take 1 tablet by mouth daily.       Discharge Assessment: Vitals:   07/02/20 0357 07/02/20 1308  BP: (!) 154/91 134/72  Pulse: 72 (!) 53  Resp: 20   Temp: 98.1 F (36.7 C) 98.5 F (36.9 C)  SpO2: 97% 96%   Skin clean, dry and intact without evidence of skin break down, no evidence of skin tears noted. IV catheter discontinued intact. Site without signs and symptoms of complications - no redness or edema noted at insertion site, patient denies c/o pain - only slight tenderness at site.  Dressing with slight pressure applied.  D/c Instructions-Education: Discharge instructions given to patient/family with verbalized understanding. D/c education completed with patient/family including follow up instructions, medication list, d/c activities limitations if indicated, with other d/c instructions as indicated by MD - patient able to verbalize understanding, all questions fully answered. Patient instructed to return to ED, call 911, or call MD for any changes in condition.  Patient escorted via [place], and D/C home via private auto.  Dorcas Mcmurray, LPN 04/07/621 7:62 PM

## 2020-07-02 NOTE — Progress Notes (Signed)
Discharged home with foley catheter in place,caretaker educated on foley care, verbalized understanding with returned demonstration of emptying foley, and verbalized how to provide foley care. Follow up with Dr Alyson Ingles in one week.Marland Kitchen

## 2020-07-02 NOTE — Care Management Obs Status (Signed)
[place] NOTIFICATION   Patient Details  Name: Kurt [facility]loff [facility] Sr. MRN: [id number] Date of Birth: Apr 19, 1932   Medicare Observation Status Notification Given:  Yes    Tommy Medal 07/02/2020, 1:25 PM

## 2020-07-04 ENCOUNTER — Ambulatory Visit (INDEPENDENT_AMBULATORY_CARE_PROVIDER_SITE_OTHER): Payer: Medicare HMO

## 2020-07-04 ENCOUNTER — Other Ambulatory Visit: Payer: Self-pay

## 2020-07-04 DIAGNOSIS — N138 Other obstructive and reflux uropathy: Secondary | ICD-10-CM | POA: Diagnosis not present

## 2020-07-04 DIAGNOSIS — N401 Enlarged prostate with lower urinary tract symptoms: Secondary | ICD-10-CM | POA: Diagnosis not present

## 2020-07-04 MED ORDER — AMBULATORY NON FORMULARY MEDICATION: 5 refills | Status: AC

## 2020-07-04 NOTE — Progress Notes (Signed)
Fill and Pull Catheter Removal  Patient is present today for a catheter removal.  Patient was cleaned and prepped in a sterile fashion 165ml of sterile water/ saline was instilled into the bladder when the patient felt the urge to urinate. 135ml of water was then drained from the balloon.  A 22FR foley cath was removed from the bladder no complications were noted .  Patient as then given some time to void on their own.  Patient can void  149ml on their own after some time.  Patient tolerated well.  Performed by: Estill Bamberg RN  Follow up/ Additional notes: 1 month PVR OV.  Urinal given to wife for patient home use.  Wife asked for a condom cath rx as well.

## 2020-07-07 ENCOUNTER — Ambulatory Visit: Payer: Medicare HMO | Admitting: Urology

## 2020-07-09 ENCOUNTER — Encounter (INDEPENDENT_AMBULATORY_CARE_PROVIDER_SITE_OTHER): Payer: Medicare HMO | Admitting: Ophthalmology

## 2020-07-10 NOTE — Discharge Summary (Signed)
Physician Discharge Summary  Patient ID: Kurt [facility] Sr. MRN: [id number] DOB/AGE: 1933/01/25 87 y.o.  Admit date: 07/01/2020 Discharge date:07/02/2020  Admission Diagnoses:  BPH Discharge Diagnoses:  Active Problems:   BPH with urinary obstruction   Past Medical History:  Diagnosis Date  . BPH (benign prostatic hypertrophy)   . Coronary artery disease cardiologist-  [name] (previously dr Domenic Polite)   abnormal stress test 11-07-2015 ; s/p  cardiac cath 11-27-2015 , non-obstructive cad and normal LVF (pLAD to mLAD 20%; mCx 20%)  . Dementia ([place])    per wife he has dementia but has not been dx by a md  . Essential hypertension, benign   . Glaucoma, both eyes   . Hypothyroidism   . Loose bowel movements   . Lower urinary tract symptoms (LUTS)   . Mixed hyperlipidemia   . OA (osteoarthritis)   . Personal history of MI (myocardial infarction)    per stress test 11-07-2015 large fixed inferior defect consistent w/ old infarction  . PVC's (premature ventricular contractions)   . RBBB (right bundle branch block)   . Wears glasses     Surgeries: Procedure(s): TRANSURETHRAL RESECTION OF THE PROSTATE (TURP) on 07/01/2020   Consultants (if any):   Discharged Condition: Improved  Hospital Course: Kurt [facility] Sr. is an 87 y.o. male who was admitted 07/01/2020 with a diagnosis of <principal problem not specified> and went to the operating room on 07/01/2020 and underwent the above named procedures.    He was given perioperative antibiotics:  Anti-infectives (From admission, onward)   Start     Dose/Rate Route Frequency Ordered Stop   07/01/20 0650  cefTRIAXone (ROCEPHIN) 2 g in sodium chloride 0.9 % 100 mL IVPB        2 g 200 mL/hr over 30 Minutes Intravenous 30 min pre-op 07/01/20 0650 07/01/20 0812    .  He was given sequential compression devices, early ambulation for DVT prophylaxis.  He benefited maximally from the hospital stay and there were no complications.     Recent vital signs:  Vitals:   07/02/20 0357 07/02/20 1308  BP: (!) 154/91 134/72  Pulse: 72 (!) 53  Resp: 20   Temp: 98.1 F (36.7 C) 98.5 F (36.9 C)  SpO2: 97% 96%    Recent laboratory studies:  Lab Results  Component Value Date   HGB 13.0 07/02/2020   HGB 14.1 07/01/2020   HGB 15.6 07/01/2020   Lab Results  Component Value Date   WBC 22.0 (H) 07/02/2020   PLT 334 07/02/2020   Lab Results  Component Value Date   INR 0.94 11/27/2015   Lab Results  Component Value Date   NA 139 07/02/2020   K 4.0 07/02/2020   CL 103 07/02/2020   CO2 29 07/02/2020   BUN 30 (H) 07/02/2020   CREATININE 1.77 (H) 07/02/2020   GLUCOSE 124 (H) 07/02/2020    Discharge Medications:   Allergies as of 07/02/2020   No Known Allergies     Medication List    STOP taking these medications   tamsulosin 0.4 MG Caps capsule Commonly known as: FLOMAX     TAKE these medications   amLODipine 2.5 MG tablet Commonly known as: NORVASC Take 2.5 mg by mouth at bedtime.   brimonidine 0.2 % ophthalmic solution Commonly known as: ALPHAGAN Place 1 drop into the left eye 2 (two) times daily.   chlorthalidone 25 MG tablet Commonly known as: HYGROTON Take 25 mg by mouth daily.   diphenhydrAMINE  25 MG tablet Commonly known as: BENADRYL Take 25-50 mg by mouth every 6 (six) hours as needed for allergies.   donepezil 10 MG tablet Commonly known as: ARICEPT Take 10 mg by mouth daily.   dorzolamide 2 % ophthalmic solution Commonly known as: TRUSOPT Place 1 drop into both eyes 2 (two) times daily.   doxepin 25 MG capsule Commonly known as: SINEQUAN Take 25 mg by mouth at bedtime.   finasteride 5 MG tablet Commonly known as: PROSCAR Take 1 tablet by mouth once daily What changed: when to take this   Klor-Con M10 10 MEQ tablet Generic drug: potassium chloride TAKE 1 TABLET BY MOUTH ONCE DAILY What changed: how much to take   latanoprost 0.005 % ophthalmic solution Commonly known  as: XALATAN Place 1 drop into both eyes at bedtime.   levothyroxine 50 MCG tablet Commonly known as: SYNTHROID Take 50 mcg by mouth daily before breakfast.   Melatonin 10 MG Caps Take 10 mg by mouth at bedtime.   memantine 10 MG tablet Commonly known as: NAMENDA Take 10 mg by mouth 2 (two) times daily.   nitrofurantoin (macrocrystal-monohydrate) 100 MG capsule Commonly known as: MACROBID Take 1 capsule (100 mg total) by mouth every 12 (twelve) hours.   Probiotic Caps Take 1 capsule by mouth daily.   VISION FORMULA PO Take 1 tablet by mouth daily.       Diagnostic Studies: No results found.  Disposition: Discharge disposition: 01-Home or Self Care       Discharge Instructions    Discharge patient   Complete by: As directed    Discharge disposition: 01-Home or Self Care   Discharge patient date: 07/02/2020       Follow-up Information    Nazaret Chea, Candee Furbish, MD. Call on 07/07/2020.   Specialty: Urology Why: voiding trial Contact information: [place]  [place] [address] [phone]        Curlene Labrum, MD Follow up.   Specialty: Family Medicine Why: call and make appointment for 1 week. Contact information: [place] [address] [phone]                Signed: Nicolette Bang 07/10/2020, 12:26 PM

## 2020-07-16 ENCOUNTER — Telehealth: Payer: Self-pay

## 2020-07-24 ENCOUNTER — Encounter (INDEPENDENT_AMBULATORY_CARE_PROVIDER_SITE_OTHER): Payer: Medicare HMO | Admitting: Ophthalmology

## 2020-07-28 DIAGNOSIS — I129 Hypertensive chronic kidney disease with stage 1 through stage 4 chronic kidney disease, or unspecified chronic kidney disease: Secondary | ICD-10-CM | POA: Diagnosis not present

## 2020-07-28 DIAGNOSIS — R69 Illness, unspecified: Secondary | ICD-10-CM | POA: Diagnosis not present

## 2020-07-28 DIAGNOSIS — G309 Alzheimer's disease, unspecified: Secondary | ICD-10-CM | POA: Diagnosis not present

## 2020-07-28 DIAGNOSIS — N183 Chronic kidney disease, stage 3 unspecified: Secondary | ICD-10-CM | POA: Diagnosis not present

## 2020-08-11 ENCOUNTER — Encounter (INDEPENDENT_AMBULATORY_CARE_PROVIDER_SITE_OTHER): Payer: Medicare HMO | Admitting: Ophthalmology

## 2020-08-20 ENCOUNTER — Ambulatory Visit: Payer: Medicare HMO | Admitting: Urology

## 2020-08-20 ENCOUNTER — Other Ambulatory Visit: Payer: Self-pay

## 2020-08-20 ENCOUNTER — Encounter: Payer: Self-pay | Admitting: Urology

## 2020-08-20 VITALS — BP 134/74 | HR 65

## 2020-08-20 DIAGNOSIS — N401 Enlarged prostate with lower urinary tract symptoms: Secondary | ICD-10-CM

## 2020-08-20 DIAGNOSIS — R351 Nocturia: Secondary | ICD-10-CM

## 2020-08-20 DIAGNOSIS — R310 Gross hematuria: Secondary | ICD-10-CM

## 2020-08-20 DIAGNOSIS — N138 Other obstructive and reflux uropathy: Secondary | ICD-10-CM

## 2020-08-20 LAB — BLADDER SCAN AMB NON-IMAGING: Scan Result: 128

## 2020-08-20 NOTE — Patient Instructions (Signed)
Benign Prostatic Hyperplasia  Benign prostatic hyperplasia (BPH) is an enlarged prostate gland that is caused by the normal aging process and not by cancer. The prostate is a walnut-sized gland that is involved in the production of semen. It is located in front of the rectum and below the bladder. The bladder stores urine and the urethra is the tube that carries the urine out of the body. The prostate may get bigger asa man gets older. An enlarged prostate can press on the urethra. This can make it harder to pass urine. The build-up of urine in the bladder can cause infection. Back pressure and infection may progress to bladder damage and kidney (renal) failure. What are the causes? This condition is part of a normal aging process. However, not all men develop problems from this condition. If the prostate enlarges away from the urethra, urine flow will not be blocked. If it enlarges toward the urethra andcompresses it, there will be problems passing urine. What increases the risk? This condition is more likely to develop in men over the age of 50 years. What are the signs or symptoms? Symptoms of this condition include: Getting up often during the night to urinate. Needing to urinate frequently during the day. Difficulty starting urine flow. Decrease in size and strength of your urine stream. Leaking (dribbling) after urinating. Inability to pass urine. This needs immediate treatment. Inability to completely empty your bladder. Pain when you pass urine. This is more common if there is also an infection. Urinary tract infection (UTI). How is this diagnosed? This condition is diagnosed based on your medical history, a physical exam, and your symptoms. Tests will also be done, such as: A post-void bladder scan. This measures any amount of urine that may remain in your bladder after you finish urinating. A digital rectal exam. In a rectal exam, your health care provider checks your prostate by  putting a lubricated, gloved finger into your rectum to feel the back of your prostate gland. This exam detects the size of your gland and any abnormal lumps or growths. An exam of your urine (urinalysis). A prostate specific antigen (PSA) screening. This is a blood test used to screen for prostate cancer. An ultrasound. This test uses sound waves to electronically produce a picture of your prostate gland. Your health care provider may refer you to a specialist in kidney and prostate diseases (urologist). How is this treated? Once symptoms begin, your health care provider will monitor your condition (active surveillance or watchful waiting). Treatment for this condition will depend on the severity of your condition. Treatment may include: Observation and yearly exams. This may be the only treatment needed if your condition and symptoms are mild. Medicines to relieve your symptoms, including: Medicines to shrink the prostate. Medicines to relax the muscle of the prostate. Surgery in severe cases. Surgery may include: Prostatectomy. In this procedure, the prostate tissue is removed completely through an open incision or with a laparoscope or robotics. Transurethral resection of the prostate (TURP). In this procedure, a tool is inserted through the opening at the tip of the penis (urethra). It is used to cut away tissue of the inner core of the prostate. The pieces are removed through the same opening of the penis. This removes the blockage. Transurethral incision (TUIP). In this procedure, small cuts are made in the prostate. This lessens the prostate's pressure on the urethra. Transurethral microwave thermotherapy (TUMT). This procedure uses microwaves to create heat. The heat destroys and removes a small   amount of prostate tissue. Transurethral needle ablation (TUNA). This procedure uses radio frequencies to destroy and remove a small amount of prostate tissue. Interstitial laser coagulation (ILC).  This procedure uses a laser to destroy and remove a small amount of prostate tissue. Transurethral electrovaporization (TUVP). This procedure uses electrodes to destroy and remove a small amount of prostate tissue. Prostatic urethral lift. This procedure inserts an implant to push the lobes of the prostate away from the urethra. Follow these instructions at home: Take over-the-counter and prescription medicines only as told by your health care provider. Monitor your symptoms for any changes. Contact your health care provider with any changes. Avoid drinking large amounts of liquid before going to bed or out in public. Avoid or reduce how much caffeine or alcohol you drink. Give yourself time when you urinate. Keep all follow-up visits as told by your health care provider. This is important. Contact a health care provider if: You have unexplained back pain. Your symptoms do not get better with treatment. You develop side effects from the medicine you are taking. Your urine becomes very dark or has a bad smell. Your lower abdomen becomes distended and you have trouble passing your urine. Get help right away if: You have a fever or chills. You suddenly cannot urinate. You feel lightheaded, or very dizzy, or you faint. There are large amounts of blood or clots in the urine. Your urinary problems become hard to manage. You develop moderate to severe low back or flank pain. The flank is the side of your body between the ribs and the hip. These symptoms may represent a serious problem that is an emergency. Do not wait to see if the symptoms will go away. Get medical help right away. Call your local emergency services (911 in the [place] Do not drive yourself to the hospital. Summary Benign prostatic hyperplasia (BPH) is an enlarged prostate that is caused by the normal aging process and not by cancer. An enlarged prostate can press on the urethra. This can make it hard to pass urine. This  condition is part of a normal aging process and is more likely to develop in men over the age of 50 years. Get help right away if you suddenly cannot urinate. This information is not intended to replace advice given to you by your health care provider. Make sure you discuss any questions you have with your healthcare provider. Document Revised: 10/25/2019 Document Reviewed: 10/25/2019 Else[employer] Patient Education  2022 [employer]  

## 2020-08-20 NOTE — Progress Notes (Signed)
08/20/2020 10:29 AM   Kurt Roys Castillo Sr. 1932/12/14 [id number]  Referring provider: Curlene Labrum, MD [place],  [place] [address]  Followup BPH and gross hematuria   HPI: Kurt French is a 87yo here for followup for BPH and gross hematuria. He underwent TURP 6 weeks ago. He had hematuria 2 weeks ago that resolved. No dysuria. He has intermittent urinary urgency and frequency. He is taking AZO prn after surgery which he has since stopped.  Incontinence has improved after TURP. Overall he is happy with his response after TURP. PVR 128cc.    PMH: Past Medical History:  Diagnosis Date   BPH (benign prostatic hypertrophy)    Coronary artery disease cardiologist-  [name] (previously dr Domenic Polite)   abnormal stress test 11-07-2015 ; s/p  cardiac cath 11-27-2015 , non-obstructive cad and normal LVF (pLAD to mLAD 20%; mCx 20%)   Kurt (Peterson)    per wife he has Kurt but has not been dx by a md   Essential hypertension, benign    Glaucoma, both eyes    Hypothyroidism    Loose bowel movements    Lower urinary tract symptoms (LUTS)    Mixed hyperlipidemia    OA (osteoarthritis)    Personal history of MI (myocardial infarction)    per stress test 11-07-2015 large fixed inferior defect consistent w/ old infarction   PVC's (premature ventricular contractions)    RBBB (right bundle branch block)    Wears glasses     Surgical History: Past Surgical History:  Procedure Laterality Date   [place] N/A 11/27/2015   Procedure: Left Heart Cath and Coronary Angiography;  Surgeon: Burnell Blanks, MD;  Location: [facility];  Service: Cardiovascular;  Laterality: N/A;   MILD NON-OBSTRUCTIVE CAD, NORMAL LVSF, EF 55-65%   CARDIAC CATHETERIZATION  08-23-2007   dr cooper   essentially normal coronary arteries w/ no obstructive cad, normal lvf, ef 65%   CARDIOVASCULAR STRESS TEST  11-07-2015   [facility] [place] [facility]    Intermediate risk nuclear study w/ large fixed inferior defect noted consistent with old infarction, mild reversibility in the inferior wall concerning for ischemia/  normal LV function and wall motion , ef 57%   CATARACT EXTRACTION W/ INTRAOCULAR LENS  IMPLANT, BILATERAL  2015  approx.   CYSTOSCOPY WITH BIOPSY N/A 05/02/2017   Procedure: CYSTOSCOPY WITH BLADDER BIOPSY AND FULGURATION;  Surgeon: Cleon Gustin, MD;  Location: [facility];  Service: Urology;  Laterality: N/A;   CYSTOSCOPY WITH INSERTION OF UROLIFT N/A 01/31/2017   Procedure: CYSTOSCOPY WITH INSERTION OF UROLIFT;  Surgeon: Cleon Gustin, MD;  Location: [facility];  Service: Urology;  Laterality: N/A;   CYSTOSCOPY WITH INSERTION OF UROLIFT N/A 05/02/2017   Procedure: CYSTOSCOPY WITH INSERTION OF UROLIFT;  Surgeon: Cleon Gustin, MD;  Location: [facility];  Service: Urology;  Laterality: N/A;   ERCP N/A 04/04/2017   Procedure: ENDOSCOPIC RETROGRADE CHOLANGIOPANCREATOGRAPHY (ERCP);  Surgeon: Rogene Houston, MD;  Location: [facility];  Service: Endoscopy;  Laterality: N/A;   KNEE ARTHROPLASTY Left 02/19/2016   Procedure: LEFT TOTAL KNEE ARTHROPLASTY WITH COMPUTER NAVIGATION;  Surgeon: Rod Can, MD;  Location: [facility];  Service: Orthopedics;  Laterality: Left;   STENT REMOVAL  04/04/2017   Procedure: BILIARY STENT REMOVAL;  Surgeon: Rogene Houston, MD;  [facility]: [facility];  Service: Endoscopy;;   TONSILLECTOMY  child   TRANSURETHRAL RESECTION OF PROSTATE  N/A 07/01/2020   Procedure: TRANSURETHRAL RESECTION OF THE PROSTATE (TURP);  Surgeon: Cleon Gustin, MD;  Location: [facility];  Service: Urology;  Laterality: N/A;    Home Medications:  Allergies as of 08/20/2020   No Known Allergies      Medication List        Accurate as of August 20, 2020 10:29 AM. If you have any questions, ask your nurse or doctor.          AMBULATORY NON FORMULARY MEDICATION Dispense 1 condom cath   amLODipine 2.5 MG  tablet Commonly known as: NORVASC Take 2.5 mg by mouth at bedtime.   brimonidine 0.2 % ophthalmic solution Commonly known as: ALPHAGAN Place 1 drop into the left eye 2 (two) times daily.   chlorthalidone 25 MG tablet Commonly known as: HYGROTON Take 25 mg by mouth daily.   diphenhydrAMINE 25 MG tablet Commonly known as: BENADRYL Take 25-50 mg by mouth every 6 (six) hours as needed for allergies.   donepezil 10 MG tablet Commonly known as: ARICEPT Take 10 mg by mouth daily.   dorzolamide 2 % ophthalmic solution Commonly known as: TRUSOPT Place 1 drop into both eyes 2 (two) times daily.   doxepin 25 MG capsule Commonly known as: SINEQUAN Take 25 mg by mouth at bedtime.   finasteride 5 MG tablet Commonly known as: PROSCAR Take 1 tablet by mouth once daily What changed: when to take this   Klor-Con M10 10 MEQ tablet Generic drug: potassium chloride TAKE 1 TABLET BY MOUTH ONCE DAILY What changed: how much to take   latanoprost 0.005 % ophthalmic solution Commonly known as: XALATAN Place 1 drop into both eyes at bedtime.   levothyroxine 50 MCG tablet Commonly known as: SYNTHROID Take 50 mcg by mouth daily before breakfast.   Melatonin 10 MG Caps Take 10 mg by mouth at bedtime.   memantine 10 MG tablet Commonly known as: NAMENDA Take 10 mg by mouth 2 (two) times daily.   nitrofurantoin (macrocrystal-monohydrate) 100 MG capsule Commonly known as: MACROBID Take 1 capsule (100 mg total) by mouth every 12 (twelve) hours.   Probiotic Caps Take 1 capsule by mouth daily.   VISION FORMULA PO Take 1 tablet by mouth daily.        Allergies: No Known Allergies  Family History: Family History  Problem Relation Age of Onset   Colon cancer Mother    Other Father        MVA    Social History:  reports that he quit smoking about 60 years ago. His smoking use included cigarettes. He has a 30.00 pack-year smoking history. He has never used smokeless tobacco. He  reports that he does not drink alcohol and does not use drugs.  ROS: All other review of systems were reviewed and are negative except what is noted above in HPI  Physical Exam: BP 134/74   Pulse 65   Constitutional:  Alert and oriented, No acute distress. HEENT: [place] AT, moist mucus membranes.  Trachea midline, no masses. Cardiovascular: No clubbing, cyanosis, or edema. Respiratory: Normal respiratory effort, no increased work of breathing. GI: Abdomen is soft, nontender, nondistended, no abdominal masses GU: No CVA tenderness.  Lymph: No cervical or inguinal lymphadenopathy. Skin: No rashes, bruises or suspicious lesions. Neurologic: Grossly intact, no focal deficits, moving all 4 extremities. Psychiatric: Normal mood and affect.  Laboratory Data: Lab Results  Component Value Date   WBC 22.0 (H) 07/02/2020   HGB 13.0 07/02/2020   HCT  39.5 07/02/2020   MCV 92.3 07/02/2020   PLT 334 07/02/2020    Lab Results  Component Value Date   CREATININE 1.77 (H) 07/02/2020    No results found for: PSA  No results found for: TESTOSTERONE  No results found for: HGBA1C  Urinalysis    Component Value Date/Time   APPEARANCEUR Hazy (A) 05/09/2020 1038   GLUCOSEU Negative 05/09/2020 1038   BILIRUBINUR Negative 05/09/2020 1038   PROTEINUR 2+ (A) 05/09/2020 1038   NITRITE Negative 05/09/2020 1038   LEUKOCYTESUR 1+ (A) 05/09/2020 1038    Lab Results  Component Value Date   LABMICR See below: 05/09/2020   WBCUA 6-10 (A) 05/09/2020   LABEPIT 0-10 05/09/2020   MUCUS Present 05/09/2020   BACTERIA Many (A) 05/09/2020    Pertinent Imaging:  No results found for this or any previous visit.  No results found for this or any previous visit.  No results found for this or any previous visit.  No results found for this or any previous visit.  No results found for this or any previous visit.  No results found for this or any previous visit.  No results found for this or any  previous visit.  Results for orders placed during the hospital encounter of 04/24/20  CT RENAL STONE STUDY  Narrative CLINICAL DATA:  Gross hematuria.  Urinary frequency.  EXAM: CT ABDOMEN AND PELVIS WITHOUT CONTRAST  TECHNIQUE: Multidetector CT imaging of the abdomen and pelvis was performed following the standard protocol without IV contrast.  COMPARISON:  05/25/2016 from [facility]  FINDINGS: Lower chest: No acute findings.  Hepatobiliary: No mass visualized on this unenhanced exam. Gallbladder is either contracted or surgically absent. No evidence of biliary ductal dilatation.  Pancreas: No mass or inflammatory process visualized on this unenhanced exam.  Spleen:  Within normal limits in size.  Adrenals/Urinary tract: A few small fluid attenuation renal cysts are again seen bilaterally. No evidence of urolithiasis or hydronephrosis. A large left-sided bladder diverticulum is again seen. Mild diffuse bladder wall thickening is seen, with soft tissues stranding along the left lateral bladder wall and diverticulum which is new since previous study. This is consistent with cystitis.  Stomach/Bowel: No evidence of obstruction, inflammatory process, or abnormal fluid collections.  Vascular/Lymphatic: No pathologically enlarged lymph nodes identified. No evidence of abdominal aortic aneurysm.  Reproductive: Mildly enlarged prostate again noted. Prior Uro-lift procedure noted.  Other:  Stable small right inguinal hernia containing only fat.  Musculoskeletal:  No suspicious bone lesions identified.  IMPRESSION: Mild diffuse bladder wall thickening, with soft tissue stranding along the left lateral bladder wall and large left-sided bladder diverticulum, consistent with cystitis.  No evidence of urolithiasis or hydronephrosis.  Stable mildly enlarged prostate.  Stable small right inguinal hernia containing only fat.   Electronically  Signed By: Marlaine Hind M.D. On: 04/24/2020 14:56   Assessment & Plan:    1. Benign prostatic hyperplasia with urinary obstruction -RTC 3 months with PVR - BLADDER SCAN AMB NON-IMAGING  2. Gross hematuria -resolved  3. Nocturia -Fluid management prior to bedtime   No follow-ups on file.  Nicolette Bang, MD  [facility] [place]

## 2020-08-20 NOTE — Progress Notes (Signed)
Pt here today for bladder scan. Bladder was scanned and 128 was visualized.   Patient not able to give urine sample.

## 2020-08-20 NOTE — Progress Notes (Addendum)
Urological Symptom Review  Patient is experiencing the following symptoms: Hard to postpone urination Get up at night to urinate Trouble starting stream Have to strain to urinate Blood in urine   Review of Systems  Gastrointestinal (upper)  : Negative for upper GI symptoms  Gastrointestinal (lower) : Negative for lower GI symptoms  Constitutional : Negative for symptoms  Skin: Negative for skin symptoms  Eyes: Negative for eye symptoms  Ear/Nose/Throat : Negative for Ear/Nose/Throat symptoms  Hematologic/Lymphatic: Negative for Hematologic/Lymphatic symptoms  Cardiovascular : Negative for cardiovascular symptoms  Respiratory : Negative for respiratory symptoms  Endocrine: Negative for endocrine symptoms  Musculoskeletal: Negative for musculoskeletal symptoms  Neurological: Negative for neurological symptoms  Psychologic: Negative for psychiatric symptoms

## 2020-08-21 ENCOUNTER — Encounter (INDEPENDENT_AMBULATORY_CARE_PROVIDER_SITE_OTHER): Payer: Self-pay | Admitting: Ophthalmology

## 2020-08-21 ENCOUNTER — Ambulatory Visit (INDEPENDENT_AMBULATORY_CARE_PROVIDER_SITE_OTHER): Payer: Medicare HMO | Admitting: Ophthalmology

## 2020-08-21 ENCOUNTER — Other Ambulatory Visit: Payer: Medicare HMO

## 2020-08-21 DIAGNOSIS — N138 Other obstructive and reflux uropathy: Secondary | ICD-10-CM

## 2020-08-21 DIAGNOSIS — H353211 Exudative age-related macular degeneration, right eye, with active choroidal neovascularization: Secondary | ICD-10-CM | POA: Diagnosis not present

## 2020-08-21 DIAGNOSIS — H353123 Nonexudative age-related macular degeneration, left eye, advanced atrophic without subfoveal involvement: Secondary | ICD-10-CM | POA: Diagnosis not present

## 2020-08-21 DIAGNOSIS — R310 Gross hematuria: Secondary | ICD-10-CM

## 2020-08-21 DIAGNOSIS — H401123 Primary open-angle glaucoma, left eye, severe stage: Secondary | ICD-10-CM | POA: Diagnosis not present

## 2020-08-21 DIAGNOSIS — N401 Enlarged prostate with lower urinary tract symptoms: Secondary | ICD-10-CM

## 2020-08-21 DIAGNOSIS — H353114 Nonexudative age-related macular degeneration, right eye, advanced atrophic with subfoveal involvement: Secondary | ICD-10-CM

## 2020-08-21 LAB — URINALYSIS, ROUTINE W REFLEX MICROSCOPIC
Bilirubin, UA: NEGATIVE
Glucose, UA: NEGATIVE
Ketones, UA: NEGATIVE
Nitrite, UA: NEGATIVE
Specific Gravity, UA: 1.025 (ref 1.005–1.030)
Urobilinogen, Ur: 0.2 mg/dL (ref 0.2–1.0)
pH, UA: 5.5 (ref 5.0–7.5)

## 2020-08-21 LAB — MICROSCOPIC EXAMINATION
RBC, Urine: >30 /hpf — AB (ref 0–2)
Renal Epithel, UA: NONE SEEN /hpf

## 2020-08-21 MED ORDER — BEVACIZUMAB 2.5 MG/0.1ML IZ SOSY
2.5000 mg | PREFILLED_SYRINGE | INTRAVITREAL | Status: AC | PRN
  Administered 2020-08-21: 2.5 mg via INTRAVITREAL

## 2020-08-21 NOTE — Progress Notes (Signed)
08/21/2020     CHIEF COMPLAINT Patient presents for Retina Follow Up (3 month fu OU and Avastin OD/Pt states VA OU stable since last visit. Pt denies FOL, floaters, or ocular pain OU. /Pt is a very poor historian and has no one accompanying him today for his office visit, but he states that he is not using Latanoprost anymore )   HISTORY OF PRESENT ILLNESS: Kurt Roys Crisco Sr. is a 87 y.o. male who presents to the clinic today for:   HPI     Retina Follow Up           Diagnosis: Wet AMD   Laterality: right eye   Onset: 3 months ago   Severity: mild   Duration: 3 months   Course: stable   Comments: 3 month fu OU and Avastin OD Pt states VA OU stable since last visit. Pt denies FOL, floaters, or ocular pain OU.  Pt is a very poor historian and has no one accompanying him today for his office visit, but he states that he is not using Latanoprost anymore        Last edited by Kendra Opitz, COA on 08/21/2020  1:49 PM.      Referring physician: Curlene Labrum, MD [place],  [place] [address]  HISTORICAL INFORMATION:   Selected notes from the [facility]: Current Outpatient Medications (Ophthalmic Drugs)  Medication Sig   brimonidine (ALPHAGAN) 0.2 % ophthalmic solution Place 1 drop into the left eye 2 (two) times daily.   dorzolamide (TRUSOPT) 2 % ophthalmic solution Place 1 drop into both eyes 2 (two) times daily.   latanoprost (XALATAN) 0.005 % ophthalmic solution Place 1 drop into both eyes at bedtime.   No current facility-administered medications for this visit. (Ophthalmic Drugs)   Current Outpatient Medications (Other)  Medication Sig   AMBULATORY NON FORMULARY MEDICATION Dispense 1 condom cath (Patient not taking: Reported on 08/20/2020)   amLODipine (NORVASC) 2.5 MG tablet Take 2.5 mg by mouth at bedtime.   chlorthalidone (HYGROTON) 25 MG tablet Take 25 mg by mouth daily.   diphenhydrAMINE (BENADRYL) 25 MG tablet  Take 25-50 mg by mouth every 6 (six) hours as needed for allergies. (Patient not taking: Reported on 08/20/2020)   donepezil (ARICEPT) 10 MG tablet Take 10 mg by mouth daily.   doxepin (SINEQUAN) 25 MG capsule Take 25 mg by mouth at bedtime.   finasteride (PROSCAR) 5 MG tablet Take 1 tablet by mouth once daily (Patient taking differently: Take 5 mg by mouth at bedtime.)   KLOR-CON M10 10 MEQ tablet TAKE 1 TABLET BY MOUTH ONCE DAILY (Patient taking differently: Take 10 mEq by mouth daily.)   levothyroxine (SYNTHROID) 50 MCG tablet Take 50 mcg by mouth daily before breakfast.   Melatonin 10 MG CAPS Take 10 mg by mouth at bedtime.   memantine (NAMENDA) 10 MG tablet Take 10 mg by mouth 2 (two) times daily.   Multiple Vitamins-Minerals (VISION FORMULA PO) Take 1 tablet by mouth daily.   nitrofurantoin, macrocrystal-monohydrate, (MACROBID) 100 MG capsule Take 1 capsule (100 mg total) by mouth every 12 (twelve) hours. (Patient not taking: No sig reported)   Probiotic CAPS Take 1 capsule by mouth daily.   No current facility-administered medications for this visit. (Other)      REVIEW OF SYSTEMS:    ALLERGIES No Known Allergies  PAST MEDICAL HISTORY Past Medical History:  Diagnosis Date  BPH (benign prostatic hypertrophy)    Coronary artery disease cardiologist-  [name] (previously dr Domenic Polite)   abnormal stress test 11-07-2015 ; s/p  cardiac cath 11-27-2015 , non-obstructive cad and normal LVF (pLAD to mLAD 20%; mCx 20%)   Dementia ([place])    per wife he has dementia but has not been dx by a md   Essential hypertension, benign    Glaucoma, both eyes    Hypothyroidism    Loose bowel movements    Lower urinary tract symptoms (LUTS)    Mixed hyperlipidemia    OA (osteoarthritis)    Personal history of MI (myocardial infarction)    per stress test 11-07-2015 large fixed inferior defect consistent w/ old infarction   PVC's (premature ventricular contractions)    RBBB (right bundle  branch block)    Wears glasses    Past Surgical History:  Procedure Laterality Date   Inger N/A 11/27/2015   Procedure: Left Heart Cath and Coronary Angiography;  Surgeon: Burnell Blanks, MD;  Location: [facility];  Service: Cardiovascular;  Laterality: N/A;   MILD NON-OBSTRUCTIVE CAD, NORMAL LVSF, EF 55-65%   CARDIAC CATHETERIZATION  08-23-2007   dr cooper   essentially normal coronary arteries w/ no obstructive cad, normal lvf, ef 65%   CARDIOVASCULAR STRESS TEST  11-07-2015   [facility] [place], [place]   Intermediate risk nuclear study w/ large fixed inferior defect noted consistent with old infarction, mild reversibility in the inferior wall concerning for ischemia/  normal LV function and wall motion , ef 57%   CATARACT EXTRACTION W/ INTRAOCULAR LENS  IMPLANT, BILATERAL  2015  approx.   CYSTOSCOPY WITH BIOPSY N/A 05/02/2017   Procedure: CYSTOSCOPY WITH BLADDER BIOPSY AND FULGURATION;  Surgeon: Cleon Gustin, MD;  Location: [facility];  Service: Urology;  Laterality: N/A;   CYSTOSCOPY WITH INSERTION OF UROLIFT N/A 01/31/2017   Procedure: CYSTOSCOPY WITH INSERTION OF UROLIFT;  Surgeon: Cleon Gustin, MD;  [facility];  Service: Urology;  Laterality: N/A;   CYSTOSCOPY WITH INSERTION OF UROLIFT N/A 05/02/2017   Procedure: CYSTOSCOPY WITH INSERTION OF UROLIFT;  Surgeon: Cleon Gustin, MD;  Location: [facility];  Service: Urology;  Laterality: N/A;   ERCP N/A 04/04/2017   Procedure: ENDOSCOPIC RETROGRADE CHOLANGIOPANCREATOGRAPHY (ERCP);  Surgeon: Rogene Houston, MD;  Location: [facility];  Service: Endoscopy;  Laterality: N/A;   KNEE ARTHROPLASTY Left 02/19/2016   Procedure: LEFT TOTAL KNEE ARTHROPLASTY WITH COMPUTER NAVIGATION;  Surgeon: Rod Can, MD;  Location: [facility];  Service: Orthopedics;  Laterality: Left;   STENT REMOVAL  04/04/2017   Procedure: BILIARY STENT REMOVAL;  Surgeon: Rogene Houston,  MD;  Location: [facility];  Service: Endoscopy;;   TONSILLECTOMY  child   TRANSURETHRAL RESECTION OF PROSTATE N/A 07/01/2020   Procedure: TRANSURETHRAL RESECTION OF THE PROSTATE (TURP);  Surgeon: Cleon Gustin, MD;  Location: [facility];  Service: Urology;  Laterality: N/A;    FAMILY HISTORY Family History  Problem Relation Age of Onset   Colon cancer Mother    Other Father        MVA    SOCIAL HISTORY Social History   Tobacco Use   Smoking status: Former    Packs/day: 1.00    Years: 30.00    Pack years: 30.00    Types: Cigarettes    Quit date: 03/01/1960    Years since quitting: 60.5   Smokeless tobacco: Never  Vaping  Use   Vaping Use: Never used  Substance Use Topics   Alcohol use: No   Drug use: No    Comment: h/o recreational drug use in the 61's         OPHTHALMIC EXAM:  Base Eye Exam     Visual Acuity (ETDRS)       Right Left   Dist cc 20/400 CF at 3'         Tonometry (Tonopen, 1:52 PM)       Right Left   Pressure 17 18         Pupils       Pupils Dark Light Shape React APD   Right PERRL 5 4 Round Slow None   Left PERRL 5 4 Round Slow None         Visual Fields       Left Right   Restrictions Total superior temporal, inferior temporal deficiencies Partial outer inferior temporal deficiency         Neuro/Psych     Mood/Affect: Normal         Dilation     Both eyes: 1.0% Mydriacyl, 2.5% Phenylephrine @ 1:52 PM           Slit Lamp and Fundus Exam     External Exam       Right Left   External Normal Normal         Slit Lamp Exam       Right Left   Lids/Lashes Normal Normal   Conjunctiva/Sclera White and quiet White and quiet   Cornea Clear Clear   Anterior Chamber Deep and quiet Deep and quiet   Iris Round and reactive Round and reactive   Lens Posterior chamber intraocular lens Posterior chamber intraocular lens   Anterior Vitreous Normal Normal         Fundus Exam       Right Left   Posterior  Vitreous Posterior vitreous detachment Posterior vitreous detachment   Disc Normal Normal   C/D Ratio 0.7 0.7   Macula Soft drusen,  less Macular thickening, Geographic atrophy in the FAZ, Hard drusen, no hemorrhage Soft drusen,Geographic atrophy in the FAZ, Hard drusen, no hemorrhage, Retinal pigment epithelial mottling   Vessels Normal Normal   Periphery Normal Normal            IMAGING AND PROCEDURES  Imaging and Procedures for 08/21/20  OCT, Retina - OU - Both Eyes       Right Eye Quality was good. Scan locations included subfoveal. Central Foveal Thickness: 217. Progression has improved. Findings include abnormal foveal contour, no IRF, no SRF.   Left Eye Quality was good. Scan locations included subfoveal. Central Foveal Thickness: 201. Progression has been stable. Findings include abnormal foveal contour, outer retinal atrophy, central retinal atrophy, inner retinal atrophy, retinal drusen , no SRF, no IRF.   Notes Much less active CNVM much less subretinal fluid as compared to July 2021 OD, currently at 12-week follow-up interval  Central foveal atrophy OD accounts for acuity.  No signs of perifoveal CNVM formation today     Intravitreal Injection, Pharmacologic Agent - OD - Right Eye       Time Out 08/21/2020. 2:10 PM. Confirmed correct patient, procedure, site, and patient consented.   Anesthesia Topical anesthesia was used. Anesthetic medications included Akten 3.5%.   Procedure Preparation included Tobramycin 0.3%, 10% betadine to eyelids, 5% betadine to ocular surface. A 30 gauge needle was used.   Injection: 2.5 mg  bevacizumab 2.5 MG/0.1ML   Route: Intravitreal   NDC: [phone], Lot: [id number]   Post-op Post injection exam found visual acuity of at least counting fingers. The patient tolerated the procedure well. There were no complications. The patient received written and verbal post procedure care education. Post injection medications were not given.               ASSESSMENT/PLAN:  Exudative age-related macular degeneration of right eye with active choroidal neovascularization (HCC) Prior active CNVM with enlargement of scotoma risk, onset November 2021, now doing very well at 12-week follow-up.  We will repeat injection today to maintain.  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 Exudative age-related macular degeneration of right eye with active choroidal neovascularization (HCC)  H35.3211 OCT, Retina - OU - Both Eyes    Intravitreal Injection, Pharmacologic Agent - OD - Right Eye    bevacizumab (AVASTIN) SOSY 2.5 mg    2. 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 Primary open angle glaucoma of left eye, severe stage  H40.1123       1.  OD vastly improved overall yet central visual acuity and scotoma size limited by geographic atrophy subfoveal. Perifoveal CNVM last active November 2021 now stabilized at 31-month interval.  We will repeat injection today and extend examination OU to 4 months  2.  3.  Ophthalmic Meds Ordered this visit:  Meds ordered this encounter  Medications   bevacizumab (AVASTIN) SOSY 2.5 mg       Return in about 4 months (around 12/21/2020) for DILATE OU, OD, AVASTIN OCT.  There are no Patient Instructions on file for this visit.   Explained the diagnoses, plan, and follow up with the patient and they expressed understanding.  Patient expressed understanding of the importance of proper follow up care.   Clent Demark Areana Kosanke M.D. Diseases & Surgery of the Retina and Vitreous [facility]ina [facility] Diabetic [place] 08/21/20     Abbreviations: M myopia (nearsighted); A  astigmatism; H hyperopia (farsighted); P presbyopia; Mrx spectacle prescription;  CTL contact lenses; OD right eye; OS left eye; OU both eyes  XT exotropia; ET esotropia; PEK punctate epithelial keratitis; PEE punctate epithelial erosions; DES dry eye syndrome; MGD meibomian gland dysfunction; ATs artificial tears; PFAT's preservative free artificial tears; [place] nuclear sclerotic cataract; PSC posterior subcapsular cataract; ERM epi-retinal membrane; PVD posterior vitreous detachment; RD retinal detachment; DM diabetes mellitus; DR diabetic retinopathy; NPDR non-proliferative diabetic retinopathy; PDR proliferative diabetic retinopathy; CSME clinically significant macular edema; DME diabetic macular edema; dbh dot blot hemorrhages; CWS cotton wool spot; POAG primary open angle glaucoma; C/D cup-to-disc ratio; HVF humphrey visual field; GVF goldmann visual field; OCT optical coherence tomography; IOP intraocular pressure; BRVO Branch retinal vein occlusion; CRVO central retinal vein occlusion; CRAO central retinal artery occlusion; BRAO branch retinal artery occlusion; RT retinal tear; SB scleral buckle; PPV pars plana vitrectomy; VH Vitreous hemorrhage; PRP panretinal laser photocoagulation; IVK intravitreal kenalog; VMT vitreomacular traction; MH Macular hole;  NVD neovascularization of the disc; NVE neovascularization elsewhere; AREDS age related eye disease study; ARMD age related macular degeneration; POAG primary open angle glaucoma; EBMD epithelial/anterior basement membrane dystrophy; ACIOL anterior chamber intraocular lens; IOL  intraocular lens; PCIOL posterior chamber intraocular lens; Phaco/IOL phacoemulsification with intraocular lens placement; [place] photorefractive keratectomy; LASIK laser assisted in situ keratomileusis; HTN hypertension; DM diabetes mellitus; COPD chronic obstructive pulmonary disease

## 2020-08-21 NOTE — Assessment & Plan Note (Signed)
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  We will repeat injection today to maintain.  We will extend exam interval to 16 weeks

## 2020-08-21 NOTE — Assessment & Plan Note (Signed)
Accounts for acuity centrally

## 2020-08-21 NOTE — Assessment & Plan Note (Signed)
Continue to observe no CNVM today

## 2020-08-21 NOTE — Assessment & Plan Note (Signed)
Follow-up with primary general eye care as scheduled

## 2020-08-23 LAB — URINE CULTURE

## 2020-08-25 NOTE — Telephone Encounter (Signed)
See previous note

## 2020-08-25 NOTE — Progress Notes (Signed)
Sent via mychart

## 2020-08-26 ENCOUNTER — Encounter: Payer: Self-pay | Admitting: Urology

## 2020-08-28 DIAGNOSIS — N183 Chronic kidney disease, stage 3 unspecified: Secondary | ICD-10-CM | POA: Diagnosis not present

## 2020-08-28 DIAGNOSIS — G309 Alzheimer's disease, unspecified: Secondary | ICD-10-CM | POA: Diagnosis not present

## 2020-08-28 DIAGNOSIS — R69 Illness, unspecified: Secondary | ICD-10-CM | POA: Diagnosis not present

## 2020-08-28 DIAGNOSIS — I129 Hypertensive chronic kidney disease with stage 1 through stage 4 chronic kidney disease, or unspecified chronic kidney disease: Secondary | ICD-10-CM | POA: Diagnosis not present

## 2020-09-18 DIAGNOSIS — Z111 Encounter for screening for respiratory tuberculosis: Secondary | ICD-10-CM | POA: Diagnosis not present

## 2020-09-18 DIAGNOSIS — Z0000 Encounter for general adult medical examination without abnormal findings: Secondary | ICD-10-CM | POA: Diagnosis not present

## 2020-09-18 DIAGNOSIS — G309 Alzheimer's disease, unspecified: Secondary | ICD-10-CM | POA: Diagnosis not present

## 2020-09-18 DIAGNOSIS — N182 Chronic kidney disease, stage 2 (mild): Secondary | ICD-10-CM | POA: Diagnosis not present

## 2020-09-18 DIAGNOSIS — E039 Hypothyroidism, unspecified: Secondary | ICD-10-CM | POA: Diagnosis not present

## 2020-09-18 DIAGNOSIS — Z6833 Body mass index (BMI) 33.0-33.9, adult: Secondary | ICD-10-CM | POA: Diagnosis not present

## 2020-09-18 DIAGNOSIS — I10 Essential (primary) hypertension: Secondary | ICD-10-CM | POA: Diagnosis not present

## 2020-09-18 DIAGNOSIS — E7849 Other hyperlipidemia: Secondary | ICD-10-CM | POA: Diagnosis not present

## 2020-10-29 DIAGNOSIS — H26492 Other secondary cataract, left eye: Secondary | ICD-10-CM | POA: Diagnosis not present

## 2020-10-29 DIAGNOSIS — H401133 Primary open-angle glaucoma, bilateral, severe stage: Secondary | ICD-10-CM | POA: Diagnosis not present

## 2020-10-29 DIAGNOSIS — Z961 Presence of intraocular lens: Secondary | ICD-10-CM | POA: Diagnosis not present

## 2020-10-29 DIAGNOSIS — I129 Hypertensive chronic kidney disease with stage 1 through stage 4 chronic kidney disease, or unspecified chronic kidney disease: Secondary | ICD-10-CM | POA: Diagnosis not present

## 2020-10-29 DIAGNOSIS — H353211 Exudative age-related macular degeneration, right eye, with active choroidal neovascularization: Secondary | ICD-10-CM | POA: Diagnosis not present

## 2020-10-29 DIAGNOSIS — N183 Chronic kidney disease, stage 3 unspecified: Secondary | ICD-10-CM | POA: Diagnosis not present

## 2020-10-29 DIAGNOSIS — G309 Alzheimer's disease, unspecified: Secondary | ICD-10-CM | POA: Diagnosis not present

## 2020-10-29 DIAGNOSIS — R69 Illness, unspecified: Secondary | ICD-10-CM | POA: Diagnosis not present

## 2020-11-07 ENCOUNTER — Other Ambulatory Visit: Payer: Self-pay | Admitting: Urology

## 2020-11-11 ENCOUNTER — Other Ambulatory Visit: Payer: Self-pay

## 2020-11-11 DIAGNOSIS — N401 Enlarged prostate with lower urinary tract symptoms: Secondary | ICD-10-CM

## 2020-11-11 DIAGNOSIS — N138 Other obstructive and reflux uropathy: Secondary | ICD-10-CM

## 2020-11-11 DIAGNOSIS — R310 Gross hematuria: Secondary | ICD-10-CM

## 2020-11-11 MED ORDER — FINASTERIDE 5 MG PO TABS: 5.0000 mg | ORAL_TABLET | Freq: Every day | ORAL | 3 refills | Status: AC

## 2020-11-21 ENCOUNTER — Ambulatory Visit: Payer: Medicare HMO | Admitting: Urology

## 2020-12-25 ENCOUNTER — Ambulatory Visit (INDEPENDENT_AMBULATORY_CARE_PROVIDER_SITE_OTHER): Payer: Medicare HMO | Admitting: Ophthalmology

## 2020-12-25 ENCOUNTER — Encounter (INDEPENDENT_AMBULATORY_CARE_PROVIDER_SITE_OTHER): Payer: Self-pay | Admitting: Ophthalmology

## 2020-12-25 ENCOUNTER — Other Ambulatory Visit: Payer: Self-pay

## 2020-12-25 DIAGNOSIS — H353123 Nonexudative age-related macular degeneration, left eye, advanced atrophic without subfoveal involvement: Secondary | ICD-10-CM | POA: Diagnosis not present

## 2020-12-25 DIAGNOSIS — H353211 Exudative age-related macular degeneration, right eye, with active choroidal neovascularization: Secondary | ICD-10-CM | POA: Diagnosis not present

## 2020-12-25 DIAGNOSIS — H401123 Primary open-angle glaucoma, left eye, severe stage: Secondary | ICD-10-CM | POA: Diagnosis not present

## 2020-12-25 NOTE — Progress Notes (Signed)
12/25/2020     CHIEF COMPLAINT Patient presents for  Chief Complaint  Patient presents with   Retina Follow Up      HISTORY OF PRESENT ILLNESS: Kurt Roys Charters Sr. is a 87 y.o. 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  This started 4 months ago.  Duration of 4 months.  Since onset it is stable.        Comments   4 month f/u OU with OCT and possible Avastin injection OD.  Per Pt's wife, he does not complain about his vision as long as he is able to wear his glasses.   EyeMeds: Brimonidine; "however many times it says on the bottle." Dorz; " " Latanoprost; " "      Last edited by Reather Littler, COA on 12/25/2020  1:34 PM.      Referring physician: Hortencia Pilar, MD [address] [place],  [place] [id number]  HISTORICAL INFORMATION:   Selected notes from the MEDICAL RECORD NUMBER       CURRENT MEDICATIONS: Current Outpatient Medications (Ophthalmic Drugs)  Medication Sig   brimonidine (ALPHAGAN) 0.2 % ophthalmic solution Place 1 drop into the left eye 2 (two) times daily.   dorzolamide (TRUSOPT) 2 % ophthalmic solution Place 1 drop into both eyes 2 (two) times daily.   latanoprost (XALATAN) 0.005 % ophthalmic solution Place 1 drop into both eyes at bedtime.   No current facility-administered medications for this visit. (Ophthalmic Drugs)   Current Outpatient Medications (Other)  Medication Sig   AMBULATORY NON FORMULARY MEDICATION Dispense 1 condom cath (Patient not taking: Reported on 08/20/2020)   amLODipine (NORVASC) 2.5 MG tablet Take 2.5 mg by mouth at bedtime.   chlorthalidone (HYGROTON) 25 MG tablet Take 25 mg by mouth daily.   diphenhydrAMINE (BENADRYL) 25 MG tablet Take 25-50 mg by mouth every 6 (six) hours as needed for allergies. (Patient not taking: Reported on 08/20/2020)   donepezil (ARICEPT) 10 MG tablet Take 10 mg by mouth daily.   doxepin  (SINEQUAN) 25 MG capsule Take 25 mg by mouth at bedtime.   finasteride (PROSCAR) 5 MG tablet Take 1 tablet (5 mg total) by mouth at bedtime.   finasteride (PROSCAR) 5 MG tablet Take 1 tablet (5 mg total) by mouth at bedtime.   KLOR-CON M10 10 MEQ tablet TAKE 1 TABLET BY MOUTH ONCE DAILY (Patient taking differently: Take 10 mEq by mouth daily.)   levothyroxine (SYNTHROID) 50 MCG tablet Take 50 mcg by mouth daily before breakfast.   Melatonin 10 MG CAPS Take 10 mg by mouth at bedtime.   memantine (NAMENDA) 10 MG tablet Take 10 mg by mouth 2 (two) times daily.   Multiple Vitamins-Minerals (VISION FORMULA PO) Take 1 tablet by mouth daily.   nitrofurantoin, macrocrystal-monohydrate, (MACROBID) 100 MG capsule Take 1 capsule (100 mg total) by mouth every 12 (twelve) hours. (Patient not taking: No sig reported)   Probiotic CAPS Take 1 capsule by mouth daily.   No current facility-administered medications for this visit. (Other)      REVIEW OF SYSTEMS:    ALLERGIES No Known Allergies  PAST MEDICAL HISTORY Past Medical History:  Diagnosis Date   BPH (benign prostatic hypertrophy)    Coronary artery disease cardiologist-  [name] (previously dr Domenic Polite)   abnormal stress test 11-07-2015 ; s/p  cardiac cath 11-27-2015 ,  non-obstructive cad and normal LVF (pLAD to mLAD 20%; mCx 20%)   Dementia (HCC)    per wife he has dementia but has not been dx by a md   Essential hypertension, benign    Glaucoma, both eyes    Hypothyroidism    Loose bowel movements    Lower urinary tract symptoms (LUTS)    Mixed hyperlipidemia    OA (osteoarthritis)    Personal history of MI (myocardial infarction)    per stress test 11-07-2015 large fixed inferior defect consistent w/ old infarction   PVC's (premature ventricular contractions)    RBBB (right bundle branch block)    Wears glasses    Past Surgical History:  Procedure Laterality Date   [place] N/A  11/27/2015   Procedure: Left Heart Cath and Coronary Angiography;  Surgeon: Burnell Blanks, MD;  Location: [facility];  Service: Cardiovascular;  Laterality: N/A;   MILD NON-OBSTRUCTIVE CAD, NORMAL LVSF, EF 55-65%   CARDIAC CATHETERIZATION  08-23-2007   dr cooper   essentially normal coronary arteries w/ no obstructive cad, normal lvf, ef 65%   CARDIOVASCULAR STRESS TEST  11-07-2015   [facility] [place]   Intermediate risk nuclear study w/ large fixed inferior defect noted consistent with old infarction, mild reversibility in the inferior wall concerning for ischemia/  normal LV function and wall motion , ef 57%   CATARACT EXTRACTION W/ INTRAOCULAR LENS  IMPLANT, BILATERAL  2015  approx.   CYSTOSCOPY WITH BIOPSY N/A 05/02/2017   Procedure: CYSTOSCOPY WITH BLADDER BIOPSY AND FULGURATION;  Surgeon: Cleon Gustin, MD;  Location: [facility];  Service: Urology;  Laterality: N/A;   CYSTOSCOPY WITH INSERTION OF UROLIFT N/A 01/31/2017   Procedure: CYSTOSCOPY WITH INSERTION OF UROLIFT;  Surgeon: Cleon Gustin, MD;  Location: [facility];  Service: Urology;  Laterality: N/A;   CYSTOSCOPY WITH INSERTION OF UROLIFT N/A 05/02/2017   Procedure: CYSTOSCOPY WITH INSERTION OF UROLIFT;  Surgeon: Cleon Gustin, MD;  Location: [facility];  Service: Urology;  Laterality: N/A;   ERCP N/A 04/04/2017   Procedure: ENDOSCOPIC RETROGRADE CHOLANGIOPANCREATOGRAPHY (ERCP);  Surgeon: Rogene Houston, MD;  Location: [facility];  Service: Endoscopy;  Laterality: N/A;   KNEE ARTHROPLASTY Left 02/19/2016   Procedure: LEFT TOTAL KNEE ARTHROPLASTY WITH COMPUTER NAVIGATION;  Surgeon: Rod Can, MD;  Location: [facility];  Service: Orthopedics;  Laterality: Left;   STENT REMOVAL  04/04/2017   Procedure: BILIARY STENT REMOVAL;  Surgeon: Rogene Houston, MD;  Location: [facility];  Service: Endoscopy;;   TONSILLECTOMY  child   TRANSURETHRAL RESECTION OF PROSTATE N/A 07/01/2020   Procedure:  TRANSURETHRAL RESECTION OF THE PROSTATE (TURP);  Surgeon: Cleon Gustin, MD;  Location: [facility];  Service: Urology;  Laterality: N/A;    FAMILY HISTORY Family History  Problem Relation Age of Onset   Colon cancer Mother    Other Father        MVA    SOCIAL HISTORY Social History   Tobacco Use   Smoking status: Former    Packs/day: 1.00    Years: 30.00    Pack years: 30.00    Types: Cigarettes    Quit date: 03/01/1960    Years since quitting: 60.8   Smokeless tobacco: Never  Vaping Use   Vaping Use: Never used  Substance Use Topics   Alcohol use: No   Drug use: No    Comment: h/o recreational drug use in  the 70's         OPHTHALMIC EXAM:  Base Eye Exam     Visual Acuity (ETDRS)       Right Left   Dist [place] CF@5ft  CF@face    Dist ph [place] NI NI  Poor understanding for Pin[place].        Tonometry (Tonopen, 1:42 PM)       Right Left   Pressure 16 21         Pupils       Pupils Dark Light Shape React APD   Right PERRL 5 4 Round Brisk None   Left PERRL 5 4 Round Brisk None         Visual Fields       Left Right     Full   Restrictions Total superior temporal, inferior temporal deficiencies   Questionable understanding        Extraocular Movement       Right Left    Full, Ortho Full, Ortho         Neuro/Psych     Mood/Affect: Normal         Dilation     Both eyes: 1.0% Mydriacyl, 2.5% Phenylephrine @ 1:42 PM           Slit Lamp and Fundus Exam     External Exam       Right Left   External Normal Normal         Slit Lamp Exam       Right Left   Lids/Lashes Normal Normal   Conjunctiva/Sclera White and quiet White and quiet   Cornea Clear Clear   Anterior Chamber Deep and quiet Deep and quiet   Iris Round and reactive Round and reactive   Lens Posterior chamber intraocular lens Posterior chamber intraocular lens   Anterior Vitreous Normal Normal         Fundus Exam       Right Left   Posterior Vitreous  Posterior vitreous detachment Posterior vitreous detachment   Disc Normal Normal   C/D Ratio 0.7 0.7   Macula Soft drusen,  less Macular thickening, Geographic atrophy in the FAZ, Hard drusen, no hemorrhage, Disciform scar Soft drusen,Geographic atrophy in the FAZ, Hard drusen, no hemorrhage, Retinal pigment epithelial mottling   Vessels Normal Normal   Periphery Normal Normal            IMAGING AND PROCEDURES  Imaging and Procedures for 12/25/20  OCT, Retina - OU - Both Eyes       Right Eye Quality was good. Scan locations included subfoveal. Central Foveal Thickness: 205. Progression has improved. Findings include abnormal foveal contour, no IRF, no SRF.   Left Eye Quality was good. Scan locations included subfoveal. Central Foveal Thickness: 201. Progression has been stable. Findings include abnormal foveal contour, outer retinal atrophy, central retinal atrophy, inner retinal atrophy, retinal drusen , no SRF, no IRF.   Notes Much less active CNVM much less subretinal fluid as compared to July 2021 OD, currently at 4 month follow-up interval  Central foveal atrophy OD accounts for acuity.  No signs of perifoveal CNVM formation today             ASSESSMENT/PLAN:  Exudative age-related macular degeneration of right eye with active choroidal neovascularization (HCC) OD, vastly improved since onset of therapy 2021 for wet AMD in the subfoveal location.  Acuity however, lingers due to subfoveal disciform scar and geographic atrophy.  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 Herbert Deaner of [facility] eye care     ICD-10-CM   1. Exudative age-related macular  degeneration of right eye with active choroidal neovascularization (HCC)  H35.3211 OCT, Retina - OU - Both Eyes    2. 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 Primary open angle glaucoma of left eye, severe stage  H40.1123       1.  OD today at 69-month follow-up for chronic large CNVM activity in the past.  No sign of active disease at 4 months post most recent injection Avastin thus no sign of recurrence at 2.5 months off of clinical therapy OD.  We will thus observe follow-up here again OD in 4 months  2.  Dilate OU 4 months and if stable then follow-up in 6 months thereafter  3.  Ophthalmic Meds Ordered this visit:  No orders of the defined types were placed in this encounter.      Return in about 4 months (around 04/27/2021) for DILATE OU, COLOR FP, OCT.  There are no Patient Instructions on file for this visit.   Explained the diagnoses, plan, and follow up with the patient and they expressed understanding.  Patient expressed understanding of the importance of proper follow up care.   Clent Demark Pavel Gadd M.D. Diseases & Surgery of the Retina and Vitreous Retina [facility] Diabetic [place] 12/25/20     Abbreviations: M myopia (nearsighted); A astigmatism; H hyperopia (farsighted); P presbyopia; Mrx spectacle prescription;  CTL contact lenses; OD right eye; OS left eye; OU both eyes  XT exotropia; ET esotropia; PEK punctate epithelial keratitis; PEE punctate epithelial erosions; DES dry eye syndrome; MGD meibomian gland dysfunction; ATs artificial tears; PFAT's preservative free artificial tears; [place] nuclear sclerotic cataract; PSC posterior subcapsular cataract; ERM epi-retinal membrane; PVD posterior vitreous detachment; RD retinal detachment; DM diabetes mellitus; DR diabetic retinopathy; NPDR non-proliferative diabetic retinopathy; PDR proliferative diabetic retinopathy; CSME clinically significant macular edema;  DME diabetic macular edema; dbh dot blot hemorrhages; CWS cotton wool spot; POAG primary open angle glaucoma; C/D cup-to-disc ratio; HVF humphrey visual field; GVF goldmann visual field; OCT optical coherence tomography; IOP intraocular pressure; BRVO Branch retinal vein occlusion; CRVO central retinal vein occlusion; CRAO central retinal artery occlusion; BRAO branch retinal artery occlusion; RT retinal tear; SB scleral buckle; PPV pars plana vitrectomy; VH Vitreous hemorrhage; PRP panretinal laser photocoagulation; IVK intravitreal kenalog; VMT vitreomacular traction; MH Macular hole;  NVD neovascularization of the disc; NVE neovascularization elsewhere; AREDS age related eye disease study; ARMD age related macular degeneration; POAG primary open angle glaucoma; EBMD epithelial/anterior basement membrane dystrophy; ACIOL anterior chamber intraocular lens; IOL intraocular lens; PCIOL posterior chamber intraocular lens; Phaco/IOL phacoemulsification with intraocular lens placement; [place] photorefractive keratectomy; LASIK laser assisted in situ keratomileusis; HTN hypertension; DM diabetes mellitus; COPD chronic obstructive pulmonary disease

## 2020-12-25 NOTE — Assessment & Plan Note (Signed)
No sign of CNVM OS, some atrophy is seen clinically in a foveal location however

## 2020-12-25 NOTE — Assessment & Plan Note (Signed)
OD, vastly improved since onset of therapy 2021 for wet AMD in the subfoveal location.  Acuity however, lingers due to subfoveal disciform scar and geographic atrophy.  No reason to continue with intravitreal Avastin today we will follow-up again in 4 months

## 2020-12-25 NOTE — Assessment & Plan Note (Signed)
Advanced cupping noted OU follow-up with Dr. Herbert Deaner of [facility] eye care

## 2020-12-30 DIAGNOSIS — I10 Essential (primary) hypertension: Secondary | ICD-10-CM | POA: Diagnosis not present

## 2020-12-30 DIAGNOSIS — Z6831 Body mass index (BMI) 31.0-31.9, adult: Secondary | ICD-10-CM | POA: Diagnosis not present

## 2020-12-30 DIAGNOSIS — N182 Chronic kidney disease, stage 2 (mild): Secondary | ICD-10-CM | POA: Diagnosis not present

## 2020-12-30 DIAGNOSIS — G309 Alzheimer's disease, unspecified: Secondary | ICD-10-CM | POA: Diagnosis not present

## 2021-03-29 DIAGNOSIS — N183 Chronic kidney disease, stage 3 unspecified: Secondary | ICD-10-CM | POA: Diagnosis not present

## 2021-03-29 DIAGNOSIS — I129 Hypertensive chronic kidney disease with stage 1 through stage 4 chronic kidney disease, or unspecified chronic kidney disease: Secondary | ICD-10-CM | POA: Diagnosis not present

## 2021-04-27 ENCOUNTER — Encounter (INDEPENDENT_AMBULATORY_CARE_PROVIDER_SITE_OTHER): Payer: Medicare HMO | Admitting: Ophthalmology

## 2021-04-28 DIAGNOSIS — H401133 Primary open-angle glaucoma, bilateral, severe stage: Secondary | ICD-10-CM | POA: Diagnosis not present

## 2021-04-28 DIAGNOSIS — H04123 Dry eye syndrome of bilateral lacrimal glands: Secondary | ICD-10-CM | POA: Diagnosis not present

## 2021-05-05 ENCOUNTER — Encounter (INDEPENDENT_AMBULATORY_CARE_PROVIDER_SITE_OTHER): Payer: Self-pay | Admitting: Ophthalmology

## 2021-05-05 ENCOUNTER — Other Ambulatory Visit: Payer: Self-pay

## 2021-05-05 ENCOUNTER — Ambulatory Visit (INDEPENDENT_AMBULATORY_CARE_PROVIDER_SITE_OTHER): Payer: Medicare HMO | Admitting: Ophthalmology

## 2021-05-05 DIAGNOSIS — H353123 Nonexudative age-related macular degeneration, left eye, advanced atrophic without subfoveal involvement: Secondary | ICD-10-CM

## 2021-05-05 DIAGNOSIS — H401123 Primary open-angle glaucoma, left eye, severe stage: Secondary | ICD-10-CM

## 2021-05-05 DIAGNOSIS — H353211 Exudative age-related macular degeneration, right eye, with active choroidal neovascularization: Secondary | ICD-10-CM

## 2021-05-05 MED ORDER — BEVACIZUMAB 2.5 MG/0.1ML IZ SOSY
2.5000 mg | PREFILLED_SYRINGE | INTRAVITREAL | Status: AC | PRN
  Administered 2021-05-05: 2.5 mg via INTRAVITREAL

## 2021-05-05 NOTE — Progress Notes (Signed)
05/05/2021     CHIEF COMPLAINT Patient presents for  Chief Complaint  Patient presents with   Macular Degeneration      HISTORY OF PRESENT ILLNESS: Kurt Shelp Fosco Sr. is a 88 y.o. male who presents to the clinic today for:   HPI   4 mos fu ou oct fp. Pt states no vision changes. Pt is using Dorzolamide BID OU, Brimonidine "however many times it says on the bottle," And Latanoprost QHS "I don't know how many times."   Last edited by Laurin Coder on 05/05/2021  2:54 PM.      Referring physician: Lisabeth Pick, MD [address] [place],  [place] [address]  HISTORICAL INFORMATION:   Selected notes from the MEDICAL RECORD NUMBER       CURRENT MEDICATIONS: Current Outpatient Medications (Ophthalmic Drugs)  Medication Sig   brimonidine (ALPHAGAN) 0.2 % ophthalmic solution Place 1 drop into the left eye 2 (two) times daily.   dorzolamide (TRUSOPT) 2 % ophthalmic solution Place 1 drop into both eyes 2 (two) times daily.   latanoprost (XALATAN) 0.005 % ophthalmic solution Place 1 drop into both eyes at bedtime.   No current facility-administered medications for this visit. (Ophthalmic Drugs)   Current Outpatient Medications (Other)  Medication Sig   AMBULATORY NON FORMULARY MEDICATION Dispense 1 condom cath (Patient not taking: Reported on 08/20/2020)   amLODipine (NORVASC) 2.5 MG tablet Take 2.5 mg by mouth at bedtime.   chlorthalidone (HYGROTON) 25 MG tablet Take 25 mg by mouth daily.   diphenhydrAMINE (BENADRYL) 25 MG tablet Take 25-50 mg by mouth every 6 (six) hours as needed for allergies. (Patient not taking: Reported on 08/20/2020)   donepezil (ARICEPT) 10 MG tablet Take 10 mg by mouth daily.   doxepin (SINEQUAN) 25 MG capsule Take 25 mg by mouth at bedtime.   finasteride (PROSCAR) 5 MG tablet Take 1 tablet (5 mg total) by mouth at bedtime.   finasteride (PROSCAR) 5 MG tablet Take 1 tablet (5 mg total) by mouth at bedtime.   KLOR-CON M10 10 MEQ tablet TAKE 1  TABLET BY MOUTH ONCE DAILY (Patient taking differently: Take 10 mEq by mouth daily.)   levothyroxine (SYNTHROID) 50 MCG tablet Take 50 mcg by mouth daily before breakfast.   Melatonin 10 MG CAPS Take 10 mg by mouth at bedtime.   memantine (NAMENDA) 10 MG tablet Take 10 mg by mouth 2 (two) times daily.   Multiple Vitamins-Minerals (VISION FORMULA PO) Take 1 tablet by mouth daily.   nitrofurantoin, macrocrystal-monohydrate, (MACROBID) 100 MG capsule Take 1 capsule (100 mg total) by mouth every 12 (twelve) hours. (Patient not taking: No sig reported)   Probiotic CAPS Take 1 capsule by mouth daily.   No current facility-administered medications for this visit. (Other)      REVIEW OF SYSTEMS: ROS   Positive for: Neurological Last edited by Laurin Coder on 05/05/2021  2:55 PM.       ALLERGIES No Known Allergies  PAST MEDICAL HISTORY Past Medical History:  Diagnosis Date   BPH (benign prostatic hypertrophy)    Coronary artery disease cardiologist-  [name] (previously dr Domenic Polite)   abnormal stress test 11-07-2015 ; s/p  cardiac cath 11-27-2015 , non-obstructive cad and normal LVF (pLAD to mLAD 20%; mCx 20%)   Dementia ([place])    per wife he has dementia but has not been dx by a md   Essential hypertension, benign    Glaucoma, both eyes    Hypothyroidism  Loose bowel movements    Lower urinary tract symptoms (LUTS)    Mixed hyperlipidemia    OA (osteoarthritis)    Personal history of MI (myocardial infarction)    per stress test 11-07-2015 large fixed inferior defect consistent w/ old infarction   PVC's (premature ventricular contractions)    RBBB (right bundle branch block)    Wears glasses    Past Surgical History:  Procedure Laterality Date   [place] N/A 11/27/2015   Procedure: Left Heart Cath and Coronary Angiography;  Surgeon: Burnell Blanks, MD;  Location: [facility];  Service: Cardiovascular;  Laterality:  N/A;   MILD NON-OBSTRUCTIVE CAD, NORMAL LVSF, EF 55-65%   CARDIAC CATHETERIZATION  08-23-2007   dr cooper   essentially normal coronary arteries w/ no obstructive cad, normal lvf, ef 65%   CARDIOVASCULAR STRESS TEST  11-07-2015   [facility], [place]   Intermediate risk nuclear study w/ large fixed inferior defect noted consistent with old infarction, mild reversibility in the inferior wall concerning for ischemia/  normal LV function and wall motion , ef 57%   CATARACT EXTRACTION W/ INTRAOCULAR LENS  IMPLANT, BILATERAL  2015  approx.   CYSTOSCOPY WITH BIOPSY N/A 05/02/2017   Procedure: CYSTOSCOPY WITH BLADDER BIOPSY AND FULGURATION;  Surgeon: Cleon Gustin, MD;  Location: [facility];  Service: Urology;  Laterality: N/A;   CYSTOSCOPY WITH INSERTION OF UROLIFT N/A 01/31/2017   Procedure: CYSTOSCOPY WITH INSERTION OF UROLIFT;  Surgeon: Cleon Gustin, MD;  [facility];  Service: Urology;  Laterality: N/A;   CYSTOSCOPY WITH INSERTION OF UROLIFT N/A 05/02/2017   Procedure: CYSTOSCOPY WITH INSERTION OF UROLIFT;  Surgeon: Cleon Gustin, MD;  Location: [facility];  Service: Urology;  Laterality: N/A;   ERCP N/A 04/04/2017   Procedure: ENDOSCOPIC RETROGRADE CHOLANGIOPANCREATOGRAPHY (ERCP);  Surgeon: Rogene Houston, MD;  Location: [facility];  Service: Endoscopy;  Laterality: N/A;   KNEE ARTHROPLASTY Left 02/19/2016   Procedure: LEFT TOTAL KNEE ARTHROPLASTY WITH COMPUTER NAVIGATION;  Surgeon: Rod Can, MD;  Location: [facility];  Service: Orthopedics;  Laterality: Left;   STENT REMOVAL  04/04/2017   Procedure: BILIARY STENT REMOVAL;  Surgeon: Rogene Houston, MD;  [facility]: [facility];  Service: Endoscopy;;   TONSILLECTOMY  child   TRANSURETHRAL RESECTION OF PROSTATE N/A 07/01/2020   Procedure: TRANSURETHRAL RESECTION OF THE PROSTATE (TURP);  Surgeon: Cleon Gustin, MD;  Location: [facility];  Service: Urology;  Laterality: N/A;    FAMILY HISTORY Family  History  Problem Relation Age of Onset   Colon cancer Mother    Other Father        MVA    SOCIAL HISTORY Social History   Tobacco Use   Smoking status: Former    Packs/day: 1.00    Years: 30.00    Pack years: 30.00    Types: Cigarettes    Quit date: 03/01/1960    Years since quitting: 61.2   Smokeless tobacco: Never  Vaping Use   Vaping Use: Never used  Substance Use Topics   Alcohol use: No   Drug use: No    Comment: h/o recreational drug use in the 70's         OPHTHALMIC EXAM:  Base Eye Exam     Visual Acuity (ETDRS)       Right Left   Dist [place] CF at 5' CF at face  Tonometry (Tonopen, 2:59 PM)       Right Left   Pressure 19 19         Pupils       Pupils Dark Light APD   Right PERRL 4 3 None   Left PERRL 4 3 None         Extraocular Movement       Right Left    Full Full         Neuro/Psych     Oriented x3: Yes   Mood/Affect: Normal         Dilation     Both eyes: 1.0% Mydriacyl, 2.5% Phenylephrine @ 2:59 PM           Slit Lamp and Fundus Exam     External Exam       Right Left   External Normal Normal         Slit Lamp Exam       Right Left   Lids/Lashes Normal Normal   Conjunctiva/Sclera White and quiet White and quiet   Cornea Clear Clear   Anterior Chamber Deep and quiet Deep and quiet   Iris Round and reactive Round and reactive   Lens Posterior chamber intraocular lens Posterior chamber intraocular lens   Anterior Vitreous Normal Normal         Fundus Exam       Right Left   Posterior Vitreous Posterior vitreous detachment Posterior vitreous detachment   Disc Normal Normal   C/D Ratio 0.7 0.7   Macula Soft drusen, less Macular thickening, Geographic atrophy in the FAZ, Hard drusen, Disciform scar, Hemorrhage Soft drusen,Geographic atrophy in the FAZ, Hard drusen, no hemorrhage, Retinal pigment epithelial mottling   Vessels Normal Normal   Periphery Normal Normal            IMAGING  AND PROCEDURES  Imaging and Procedures for 05/05/21  OCT, Retina - OU - Both Eyes       Right Eye Quality was good. Scan locations included subfoveal. Central Foveal Thickness: 205. Progression has improved. Findings include abnormal foveal contour, no IRF, subretinal hyper-reflective material.   Left Eye Quality was good. Scan locations included subfoveal. Central Foveal Thickness: 201. Progression has been stable. Findings include abnormal foveal contour, outer retinal atrophy, central retinal atrophy, inner retinal atrophy, retinal drusen , no SRF, no IRF.   Notes Much less active CNVM much less subretinal fluid as compared to July 2021 OD, currently at 4 month follow-up interval yet still active with hemorrhage seen clinically  Central foveal atrophy OD accounts for acuity.  No signs of perifoveal CNVM formation today     Color Fundus Photography Optos - OU - Both Eyes       Right Eye Progression has been stable. Disc findings include increased cup to disc ratio. Macula : geographic atrophy, hemorrhage. Vessels : normal observations. Periphery : normal observations.   Left Eye Progression has been stable. Disc findings include pallor, increased cup to disc ratio. Vessels : normal observations. Periphery : normal observations.   Notes Large region of geographic atrophy centrally OD with a rim of SR hemorrhage Intraretinal hemorrhage on the nasal aspect threatening enlargement of scotoma OD     Intravitreal Injection, Pharmacologic Agent - OD - Right Eye       Time Out 05/05/2021. 3:44 PM. Confirmed correct patient, procedure, site, and patient consented.   Anesthesia Topical anesthesia was used. Anesthetic medications included Lidocaine 4%.   Procedure Preparation included Tobramycin 0.3%,  10% betadine to eyelids, 5% betadine to ocular surface. A 30 gauge needle was used.   Injection: 2.5 mg bevacizumab 2.5 MG/0.1ML   Route: Intravitreal, Site: Right Eye   NDC:  [phone], Lot: [id number]   Post-op Post injection exam found visual acuity of at least counting fingers. The patient tolerated the procedure well. There were no complications. The patient received written and verbal post procedure care education. Post injection medications included ocuflox.              ASSESSMENT/PLAN:  Exudative age-related macular degeneration of right eye with active choroidal neovascularization (HCC) The nature of wet macular degeneration was discussed with the patient.  Forms of therapy reviewed include the use of Anti-VEGF medications injected painlessly into the eye, as well as other possible treatment modalities, including thermal laser therapy. Fellow eye involvement and risks were discussed with the patient. Upon the finding of wet age related macular degeneration, treatment will be offered. The treatment regimen is on a treat as needed basis with the intent to treat if necessary and extend interval of exams when possible. On average 1 out of 6 patients do not need lifetime therapy. However, the risk of recurrent disease is high for a lifetime.  Initially monthly, then periodic, examinations and evaluations will determine whether the next treatment is required on the day of the examination.  OD currently at 3.2 weeks post most recent injection still with area of intraretinal hemorrhage nasal portions of geographic atrophy threatening enlargement of scotoma.  Repeat injection Avastin today to maintain  Advanced nonexudative age-related macular degeneration of left eye without subfoveal involvement Atrophy nearing FAZ  Primary open angle glaucoma of left eye, severe stage Severe optic atrophy accounts for acuity     ICD-10-CM   1. Exudative age-related macular degeneration of right eye with active choroidal neovascularization (HCC)  H35.3211 OCT, Retina - OU - Both Eyes    Color Fundus Photography Optos - OU - Both Eyes    Intravitreal Injection, Pharmacologic  Agent - OD - Right Eye    bevacizumab (AVASTIN) SOSY 2.5 mg    2. Advanced nonexudative age-related macular degeneration of left eye without subfoveal involvement  H35.3123 OCT, Retina - OU - Both Eyes    Color Fundus Photography Optos - OU - Both Eyes    3. Primary open angle glaucoma of left eye, severe stage  H40.1123       1.  OD with chronic active CNVM nasal aspect of large geographic atrophy centrally, threatening enlargement of scotoma.  Repeat injection today to maintain and prevent enlargement of scotoma so as to maintain patient's viability with ambulatory status  2.  3.  Ophthalmic Meds Ordered this visit:  Meds ordered this encounter  Medications   bevacizumab (AVASTIN) SOSY 2.5 mg       Return in about 3 months (around 08/05/2021) for dilate, OD, AVASTIN OCT.  There are no Patient Instructions on file for this visit.   Explained the diagnoses, plan, and follow up with the patient and they expressed understanding.  Patient expressed understanding of the importance of proper follow up care.   Clent Demark Moira Umholtz M.D. Diseases & Surgery of the Retina and Vitreous [facility]ina [facility] Diabetic [place] 05/05/21     Abbreviations: M myopia (nearsighted); A astigmatism; H hyperopia (farsighted); P presbyopia; Mrx spectacle prescription;  CTL contact lenses; OD right eye; OS left eye; OU both eyes  XT exotropia; ET esotropia; PEK punctate epithelial keratitis; PEE punctate epithelial erosions; DES dry  eye syndrome; MGD meibomian gland dysfunction; ATs artificial tears; PFAT's preservative free artificial tears; [place] nuclear sclerotic cataract; PSC posterior subcapsular cataract; ERM epi-retinal membrane; PVD posterior vitreous detachment; RD retinal detachment; DM diabetes mellitus; DR diabetic retinopathy; NPDR non-proliferative diabetic retinopathy; PDR proliferative diabetic retinopathy; CSME clinically significant macular edema; DME diabetic macular edema; dbh dot blot hemorrhages;  CWS cotton wool spot; POAG primary open angle glaucoma; C/D cup-to-disc ratio; HVF humphrey visual field; GVF goldmann visual field; OCT optical coherence tomography; IOP intraocular pressure; BRVO Branch retinal vein occlusion; CRVO central retinal vein occlusion; CRAO central retinal artery occlusion; BRAO branch retinal artery occlusion; RT retinal tear; SB scleral buckle; PPV pars plana vitrectomy; VH Vitreous hemorrhage; PRP panretinal laser photocoagulation; IVK intravitreal kenalog; VMT vitreomacular traction; MH Macular hole;  NVD neovascularization of the disc; NVE neovascularization elsewhere; AREDS age related eye disease study; ARMD age related macular degeneration; POAG primary open angle glaucoma; EBMD epithelial/anterior basement membrane dystrophy; ACIOL anterior chamber intraocular lens; IOL intraocular lens; PCIOL posterior chamber intraocular lens; Phaco/IOL phacoemulsification with intraocular lens placement; [place] photorefractive keratectomy; LASIK laser assisted in situ keratomileusis; HTN hypertension; DM diabetes mellitus; COPD chronic obstructive pulmonary disease

## 2021-05-05 NOTE — Assessment & Plan Note (Signed)
Severe optic atrophy accounts for acuity ?

## 2021-05-05 NOTE — Assessment & Plan Note (Signed)
Atrophy nearing FAZ ?

## 2021-05-05 NOTE — Assessment & Plan Note (Signed)
The nature of wet macular degeneration was discussed with the patient.  Forms of therapy reviewed include the use of Anti-VEGF medications injected painlessly into the eye, as well as other possible treatment modalities, including thermal laser therapy. Fellow eye involvement and risks were discussed with the patient. Upon the finding of wet age related macular degeneration, treatment will be offered. The treatment regimen is on a treat as needed basis with the intent to treat if necessary and extend interval of exams when possible. On average 1 out of 6 patients do not need lifetime therapy. However, the risk of recurrent disease is high for a lifetime.  Initially monthly, then periodic, examinations and evaluations will determine whether the next treatment is required on the day of the examination. ? ?OD currently at 3.2 weeks post most recent injection still with area of intraretinal hemorrhage nasal portions of geographic atrophy threatening enlargement of scotoma.  Repeat injection Avastin today to maintain ?

## 2021-06-30 DIAGNOSIS — D0339 Melanoma in situ of other parts of face: Secondary | ICD-10-CM | POA: Diagnosis not present

## 2021-07-09 DIAGNOSIS — G319 Degenerative disease of nervous system, unspecified: Secondary | ICD-10-CM | POA: Diagnosis not present

## 2021-07-09 DIAGNOSIS — R509 Fever, unspecified: Secondary | ICD-10-CM | POA: Diagnosis not present

## 2021-07-09 DIAGNOSIS — R918 Other nonspecific abnormal finding of lung field: Secondary | ICD-10-CM | POA: Diagnosis not present

## 2021-07-09 DIAGNOSIS — R69 Illness, unspecified: Secondary | ICD-10-CM | POA: Diagnosis not present

## 2021-07-09 DIAGNOSIS — D72829 Elevated white blood cell count, unspecified: Secondary | ICD-10-CM | POA: Diagnosis not present

## 2021-07-09 DIAGNOSIS — R0602 Shortness of breath: Secondary | ICD-10-CM | POA: Diagnosis not present

## 2021-07-09 DIAGNOSIS — Z20822 Contact with and (suspected) exposure to covid-19: Secondary | ICD-10-CM | POA: Diagnosis not present

## 2021-07-09 DIAGNOSIS — R062 Wheezing: Secondary | ICD-10-CM | POA: Diagnosis not present

## 2021-07-09 DIAGNOSIS — R4182 Altered mental status, unspecified: Secondary | ICD-10-CM | POA: Diagnosis not present

## 2021-07-09 DIAGNOSIS — R059 Cough, unspecified: Secondary | ICD-10-CM | POA: Diagnosis not present

## 2021-07-09 DIAGNOSIS — R109 Unspecified abdominal pain: Secondary | ICD-10-CM | POA: Diagnosis not present

## 2021-07-09 DIAGNOSIS — G9340 Encephalopathy, unspecified: Secondary | ICD-10-CM | POA: Diagnosis not present

## 2021-07-10 DIAGNOSIS — H9190 Unspecified hearing loss, unspecified ear: Secondary | ICD-10-CM | POA: Diagnosis not present

## 2021-07-10 DIAGNOSIS — N183 Chronic kidney disease, stage 3 unspecified: Secondary | ICD-10-CM | POA: Diagnosis not present

## 2021-07-10 DIAGNOSIS — N179 Acute kidney failure, unspecified: Secondary | ICD-10-CM | POA: Diagnosis not present

## 2021-07-10 DIAGNOSIS — I440 Atrioventricular block, first degree: Secondary | ICD-10-CM | POA: Diagnosis not present

## 2021-07-10 DIAGNOSIS — G309 Alzheimer's disease, unspecified: Secondary | ICD-10-CM | POA: Diagnosis not present

## 2021-07-10 DIAGNOSIS — N170 Acute kidney failure with tubular necrosis: Secondary | ICD-10-CM | POA: Diagnosis not present

## 2021-07-10 DIAGNOSIS — I129 Hypertensive chronic kidney disease with stage 1 through stage 4 chronic kidney disease, or unspecified chronic kidney disease: Secondary | ICD-10-CM | POA: Diagnosis not present

## 2021-07-10 DIAGNOSIS — R109 Unspecified abdominal pain: Secondary | ICD-10-CM | POA: Diagnosis not present

## 2021-07-10 DIAGNOSIS — E876 Hypokalemia: Secondary | ICD-10-CM | POA: Diagnosis not present

## 2021-07-10 DIAGNOSIS — R339 Retention of urine, unspecified: Secondary | ICD-10-CM | POA: Diagnosis not present

## 2021-07-10 DIAGNOSIS — R9431 Abnormal electrocardiogram [ECG] [EKG]: Secondary | ICD-10-CM | POA: Diagnosis not present

## 2021-07-10 DIAGNOSIS — R4182 Altered mental status, unspecified: Secondary | ICD-10-CM | POA: Diagnosis not present

## 2021-07-10 DIAGNOSIS — G4710 Hypersomnia, unspecified: Secondary | ICD-10-CM | POA: Diagnosis not present

## 2021-07-10 DIAGNOSIS — I452 Bifascicular block: Secondary | ICD-10-CM | POA: Diagnosis not present

## 2021-07-10 DIAGNOSIS — R0602 Shortness of breath: Secondary | ICD-10-CM | POA: Diagnosis not present

## 2021-07-10 DIAGNOSIS — E870 Hyperosmolality and hypernatremia: Secondary | ICD-10-CM | POA: Diagnosis not present

## 2021-07-10 DIAGNOSIS — R509 Fever, unspecified: Secondary | ICD-10-CM | POA: Diagnosis not present

## 2021-07-10 DIAGNOSIS — R059 Cough, unspecified: Secondary | ICD-10-CM | POA: Diagnosis not present

## 2021-07-10 DIAGNOSIS — G9340 Encephalopathy, unspecified: Secondary | ICD-10-CM | POA: Diagnosis not present

## 2021-07-10 DIAGNOSIS — I491 Atrial premature depolarization: Secondary | ICD-10-CM | POA: Diagnosis not present

## 2021-07-10 DIAGNOSIS — F17210 Nicotine dependence, cigarettes, uncomplicated: Secondary | ICD-10-CM | POA: Diagnosis not present

## 2021-07-10 DIAGNOSIS — J690 Pneumonitis due to inhalation of food and vomit: Secondary | ICD-10-CM | POA: Diagnosis not present

## 2021-07-10 DIAGNOSIS — R69 Illness, unspecified: Secondary | ICD-10-CM | POA: Diagnosis not present

## 2021-07-10 DIAGNOSIS — G319 Degenerative disease of nervous system, unspecified: Secondary | ICD-10-CM | POA: Diagnosis not present

## 2021-07-10 DIAGNOSIS — Z20822 Contact with and (suspected) exposure to covid-19: Secondary | ICD-10-CM | POA: Diagnosis not present

## 2021-07-10 DIAGNOSIS — N3000 Acute cystitis without hematuria: Secondary | ICD-10-CM | POA: Diagnosis not present

## 2021-07-10 DIAGNOSIS — R918 Other nonspecific abnormal finding of lung field: Secondary | ICD-10-CM | POA: Diagnosis not present

## 2021-07-10 DIAGNOSIS — R001 Bradycardia, unspecified: Secondary | ICD-10-CM | POA: Diagnosis not present

## 2021-07-10 DIAGNOSIS — R062 Wheezing: Secondary | ICD-10-CM | POA: Diagnosis not present

## 2021-07-10 DIAGNOSIS — I444 Left anterior fascicular block: Secondary | ICD-10-CM | POA: Diagnosis not present

## 2021-07-10 DIAGNOSIS — D72829 Elevated white blood cell count, unspecified: Secondary | ICD-10-CM | POA: Diagnosis not present

## 2021-07-11 DIAGNOSIS — N3000 Acute cystitis without hematuria: Secondary | ICD-10-CM | POA: Diagnosis not present

## 2021-07-11 DIAGNOSIS — J690 Pneumonitis due to inhalation of food and vomit: Secondary | ICD-10-CM | POA: Diagnosis not present

## 2021-07-11 DIAGNOSIS — N170 Acute kidney failure with tubular necrosis: Secondary | ICD-10-CM | POA: Diagnosis not present

## 2021-07-11 DIAGNOSIS — R339 Retention of urine, unspecified: Secondary | ICD-10-CM | POA: Diagnosis not present

## 2021-07-11 DIAGNOSIS — G309 Alzheimer's disease, unspecified: Secondary | ICD-10-CM | POA: Diagnosis not present

## 2021-07-11 DIAGNOSIS — R69 Illness, unspecified: Secondary | ICD-10-CM | POA: Diagnosis not present

## 2021-07-11 DIAGNOSIS — E876 Hypokalemia: Secondary | ICD-10-CM | POA: Diagnosis not present

## 2021-07-11 DIAGNOSIS — N183 Chronic kidney disease, stage 3 unspecified: Secondary | ICD-10-CM | POA: Diagnosis not present

## 2021-07-12 DIAGNOSIS — E876 Hypokalemia: Secondary | ICD-10-CM | POA: Diagnosis not present

## 2021-07-12 DIAGNOSIS — G309 Alzheimer's disease, unspecified: Secondary | ICD-10-CM | POA: Diagnosis not present

## 2021-07-12 DIAGNOSIS — N170 Acute kidney failure with tubular necrosis: Secondary | ICD-10-CM | POA: Diagnosis not present

## 2021-07-12 DIAGNOSIS — R69 Illness, unspecified: Secondary | ICD-10-CM | POA: Diagnosis not present

## 2021-07-12 DIAGNOSIS — N183 Chronic kidney disease, stage 3 unspecified: Secondary | ICD-10-CM | POA: Diagnosis not present

## 2021-07-12 DIAGNOSIS — J690 Pneumonitis due to inhalation of food and vomit: Secondary | ICD-10-CM | POA: Diagnosis not present

## 2021-07-12 DIAGNOSIS — N3000 Acute cystitis without hematuria: Secondary | ICD-10-CM | POA: Diagnosis not present

## 2021-07-12 DIAGNOSIS — R339 Retention of urine, unspecified: Secondary | ICD-10-CM | POA: Diagnosis not present

## 2021-07-13 DIAGNOSIS — N3000 Acute cystitis without hematuria: Secondary | ICD-10-CM | POA: Diagnosis not present

## 2021-07-13 DIAGNOSIS — J690 Pneumonitis due to inhalation of food and vomit: Secondary | ICD-10-CM | POA: Diagnosis not present

## 2021-07-13 DIAGNOSIS — R339 Retention of urine, unspecified: Secondary | ICD-10-CM | POA: Diagnosis not present

## 2021-07-13 DIAGNOSIS — E876 Hypokalemia: Secondary | ICD-10-CM | POA: Diagnosis not present

## 2021-07-13 DIAGNOSIS — G309 Alzheimer's disease, unspecified: Secondary | ICD-10-CM | POA: Diagnosis not present

## 2021-07-13 DIAGNOSIS — R69 Illness, unspecified: Secondary | ICD-10-CM | POA: Diagnosis not present

## 2021-07-13 DIAGNOSIS — N170 Acute kidney failure with tubular necrosis: Secondary | ICD-10-CM | POA: Diagnosis not present

## 2021-07-13 DIAGNOSIS — N183 Chronic kidney disease, stage 3 unspecified: Secondary | ICD-10-CM | POA: Diagnosis not present

## 2021-07-14 DIAGNOSIS — G309 Alzheimer's disease, unspecified: Secondary | ICD-10-CM | POA: Diagnosis not present

## 2021-07-14 DIAGNOSIS — R339 Retention of urine, unspecified: Secondary | ICD-10-CM | POA: Diagnosis not present

## 2021-07-14 DIAGNOSIS — N183 Chronic kidney disease, stage 3 unspecified: Secondary | ICD-10-CM | POA: Diagnosis not present

## 2021-07-14 DIAGNOSIS — N3000 Acute cystitis without hematuria: Secondary | ICD-10-CM | POA: Diagnosis not present

## 2021-07-14 DIAGNOSIS — N170 Acute kidney failure with tubular necrosis: Secondary | ICD-10-CM | POA: Diagnosis not present

## 2021-07-14 DIAGNOSIS — J690 Pneumonitis due to inhalation of food and vomit: Secondary | ICD-10-CM | POA: Diagnosis not present

## 2021-07-14 DIAGNOSIS — R69 Illness, unspecified: Secondary | ICD-10-CM | POA: Diagnosis not present

## 2021-07-14 DIAGNOSIS — E876 Hypokalemia: Secondary | ICD-10-CM | POA: Diagnosis not present

## 2021-07-16 DIAGNOSIS — I440 Atrioventricular block, first degree: Secondary | ICD-10-CM | POA: Diagnosis not present

## 2021-07-16 DIAGNOSIS — R001 Bradycardia, unspecified: Secondary | ICD-10-CM | POA: Diagnosis not present

## 2021-07-16 DIAGNOSIS — R9431 Abnormal electrocardiogram [ECG] [EKG]: Secondary | ICD-10-CM | POA: Diagnosis not present

## 2021-07-16 DIAGNOSIS — I444 Left anterior fascicular block: Secondary | ICD-10-CM | POA: Diagnosis not present

## 2021-07-18 DIAGNOSIS — Z9181 History of falling: Secondary | ICD-10-CM | POA: Diagnosis not present

## 2021-07-18 DIAGNOSIS — J690 Pneumonitis due to inhalation of food and vomit: Secondary | ICD-10-CM | POA: Diagnosis not present

## 2021-07-18 DIAGNOSIS — J698 Pneumonitis due to inhalation of other solids and liquids: Secondary | ICD-10-CM | POA: Diagnosis not present

## 2021-07-18 DIAGNOSIS — N170 Acute kidney failure with tubular necrosis: Secondary | ICD-10-CM | POA: Diagnosis not present

## 2021-07-18 DIAGNOSIS — H409 Unspecified glaucoma: Secondary | ICD-10-CM | POA: Diagnosis not present

## 2021-07-18 DIAGNOSIS — F411 Generalized anxiety disorder: Secondary | ICD-10-CM | POA: Diagnosis not present

## 2021-07-18 DIAGNOSIS — R69 Illness, unspecified: Secondary | ICD-10-CM | POA: Diagnosis not present

## 2021-07-18 DIAGNOSIS — R1310 Dysphagia, unspecified: Secondary | ICD-10-CM | POA: Diagnosis not present

## 2021-07-18 DIAGNOSIS — I10 Essential (primary) hypertension: Secondary | ICD-10-CM | POA: Diagnosis not present

## 2021-07-18 DIAGNOSIS — R1312 Dysphagia, oropharyngeal phase: Secondary | ICD-10-CM | POA: Diagnosis not present

## 2021-07-18 DIAGNOSIS — M6281 Muscle weakness (generalized): Secondary | ICD-10-CM | POA: Diagnosis not present

## 2021-07-18 DIAGNOSIS — G301 Alzheimer's disease with late onset: Secondary | ICD-10-CM | POA: Diagnosis not present

## 2021-07-18 DIAGNOSIS — G4700 Insomnia, unspecified: Secondary | ICD-10-CM | POA: Diagnosis not present

## 2021-07-18 DIAGNOSIS — Z66 Do not resuscitate: Secondary | ICD-10-CM | POA: Diagnosis not present

## 2021-07-18 DIAGNOSIS — Z6829 Body mass index (BMI) 29.0-29.9, adult: Secondary | ICD-10-CM | POA: Diagnosis not present

## 2021-07-18 DIAGNOSIS — G309 Alzheimer's disease, unspecified: Secondary | ICD-10-CM | POA: Diagnosis not present

## 2021-07-18 DIAGNOSIS — N3000 Acute cystitis without hematuria: Secondary | ICD-10-CM | POA: Diagnosis not present

## 2021-07-18 DIAGNOSIS — E46 Unspecified protein-calorie malnutrition: Secondary | ICD-10-CM | POA: Diagnosis not present

## 2021-07-18 DIAGNOSIS — N183 Chronic kidney disease, stage 3 unspecified: Secondary | ICD-10-CM | POA: Diagnosis not present

## 2021-07-18 DIAGNOSIS — R339 Retention of urine, unspecified: Secondary | ICD-10-CM | POA: Diagnosis not present

## 2021-07-18 DIAGNOSIS — E876 Hypokalemia: Secondary | ICD-10-CM | POA: Diagnosis not present

## 2021-07-18 DIAGNOSIS — E039 Hypothyroidism, unspecified: Secondary | ICD-10-CM | POA: Diagnosis not present

## 2021-07-18 DIAGNOSIS — F32A Depression, unspecified: Secondary | ICD-10-CM | POA: Diagnosis not present

## 2021-07-18 DIAGNOSIS — J189 Pneumonia, unspecified organism: Secondary | ICD-10-CM | POA: Diagnosis not present

## 2021-07-20 DIAGNOSIS — J690 Pneumonitis due to inhalation of food and vomit: Secondary | ICD-10-CM | POA: Diagnosis not present

## 2021-07-20 DIAGNOSIS — R1310 Dysphagia, unspecified: Secondary | ICD-10-CM | POA: Diagnosis not present

## 2021-07-20 DIAGNOSIS — N183 Chronic kidney disease, stage 3 unspecified: Secondary | ICD-10-CM | POA: Diagnosis not present

## 2021-07-20 DIAGNOSIS — R69 Illness, unspecified: Secondary | ICD-10-CM | POA: Diagnosis not present

## 2021-07-20 DIAGNOSIS — Z66 Do not resuscitate: Secondary | ICD-10-CM | POA: Diagnosis not present

## 2021-07-20 DIAGNOSIS — G301 Alzheimer's disease with late onset: Secondary | ICD-10-CM | POA: Diagnosis not present

## 2021-07-21 DIAGNOSIS — E039 Hypothyroidism, unspecified: Secondary | ICD-10-CM | POA: Diagnosis not present

## 2021-07-21 DIAGNOSIS — J690 Pneumonitis due to inhalation of food and vomit: Secondary | ICD-10-CM | POA: Diagnosis not present

## 2021-07-24 DIAGNOSIS — R69 Illness, unspecified: Secondary | ICD-10-CM | POA: Diagnosis not present

## 2021-07-24 DIAGNOSIS — G309 Alzheimer's disease, unspecified: Secondary | ICD-10-CM | POA: Diagnosis not present

## 2021-07-25 DIAGNOSIS — N3001 Acute cystitis with hematuria: Secondary | ICD-10-CM | POA: Diagnosis not present

## 2021-07-25 DIAGNOSIS — R9431 Abnormal electrocardiogram [ECG] [EKG]: Secondary | ICD-10-CM | POA: Diagnosis not present

## 2021-07-25 DIAGNOSIS — I440 Atrioventricular block, first degree: Secondary | ICD-10-CM | POA: Diagnosis not present

## 2021-07-25 DIAGNOSIS — R456 Violent behavior: Secondary | ICD-10-CM | POA: Diagnosis not present

## 2021-07-25 DIAGNOSIS — R69 Illness, unspecified: Secondary | ICD-10-CM | POA: Diagnosis not present

## 2021-07-25 DIAGNOSIS — I4519 Other right bundle-branch block: Secondary | ICD-10-CM | POA: Diagnosis not present

## 2021-07-25 DIAGNOSIS — R001 Bradycardia, unspecified: Secondary | ICD-10-CM | POA: Diagnosis not present

## 2021-07-25 DIAGNOSIS — I493 Ventricular premature depolarization: Secondary | ICD-10-CM | POA: Diagnosis not present

## 2021-07-25 DIAGNOSIS — R4182 Altered mental status, unspecified: Secondary | ICD-10-CM | POA: Diagnosis not present

## 2021-07-27 DIAGNOSIS — I129 Hypertensive chronic kidney disease with stage 1 through stage 4 chronic kidney disease, or unspecified chronic kidney disease: Secondary | ICD-10-CM | POA: Diagnosis not present

## 2021-07-27 DIAGNOSIS — G928 Other toxic encephalopathy: Secondary | ICD-10-CM | POA: Diagnosis not present

## 2021-07-27 DIAGNOSIS — Z1612 Extended spectrum beta lactamase (ESBL) resistance: Secondary | ICD-10-CM | POA: Diagnosis not present

## 2021-07-27 DIAGNOSIS — T447X5A Adverse effect of beta-adrenoreceptor antagonists, initial encounter: Secondary | ICD-10-CM | POA: Diagnosis not present

## 2021-07-27 DIAGNOSIS — T441X5A Adverse effect of other parasympathomimetics [cholinergics], initial encounter: Secondary | ICD-10-CM | POA: Diagnosis not present

## 2021-07-27 DIAGNOSIS — T424X5A Adverse effect of benzodiazepines, initial encounter: Secondary | ICD-10-CM | POA: Diagnosis not present

## 2021-07-27 DIAGNOSIS — B9689 Other specified bacterial agents as the cause of diseases classified elsewhere: Secondary | ICD-10-CM | POA: Diagnosis not present

## 2021-07-27 DIAGNOSIS — Z20822 Contact with and (suspected) exposure to covid-19: Secondary | ICD-10-CM | POA: Diagnosis not present

## 2021-07-27 DIAGNOSIS — I10 Essential (primary) hypertension: Secondary | ICD-10-CM | POA: Diagnosis not present

## 2021-07-27 DIAGNOSIS — B961 Klebsiella pneumoniae [K. pneumoniae] as the cause of diseases classified elsewhere: Secondary | ICD-10-CM | POA: Diagnosis not present

## 2021-07-27 DIAGNOSIS — I452 Bifascicular block: Secondary | ICD-10-CM | POA: Diagnosis not present

## 2021-07-27 DIAGNOSIS — S199XXA Unspecified injury of neck, initial encounter: Secondary | ICD-10-CM | POA: Diagnosis not present

## 2021-07-27 DIAGNOSIS — R918 Other nonspecific abnormal finding of lung field: Secondary | ICD-10-CM | POA: Diagnosis not present

## 2021-07-27 DIAGNOSIS — J189 Pneumonia, unspecified organism: Secondary | ICD-10-CM | POA: Diagnosis not present

## 2021-07-27 DIAGNOSIS — Z66 Do not resuscitate: Secondary | ICD-10-CM | POA: Diagnosis not present

## 2021-07-27 DIAGNOSIS — R001 Bradycardia, unspecified: Secondary | ICD-10-CM | POA: Diagnosis not present

## 2021-07-27 DIAGNOSIS — I160 Hypertensive urgency: Secondary | ICD-10-CM | POA: Diagnosis not present

## 2021-07-27 DIAGNOSIS — N189 Chronic kidney disease, unspecified: Secondary | ICD-10-CM | POA: Diagnosis not present

## 2021-07-27 DIAGNOSIS — T50905A Adverse effect of unspecified drugs, medicaments and biological substances, initial encounter: Secondary | ICD-10-CM | POA: Diagnosis not present

## 2021-07-27 DIAGNOSIS — F0394 Unspecified dementia, unspecified severity, with anxiety: Secondary | ICD-10-CM | POA: Diagnosis not present

## 2021-07-27 DIAGNOSIS — R9431 Abnormal electrocardiogram [ECG] [EKG]: Secondary | ICD-10-CM | POA: Diagnosis not present

## 2021-07-27 DIAGNOSIS — G9340 Encephalopathy, unspecified: Secondary | ICD-10-CM | POA: Diagnosis not present

## 2021-07-27 DIAGNOSIS — S0990XA Unspecified injury of head, initial encounter: Secondary | ICD-10-CM | POA: Diagnosis not present

## 2021-07-27 DIAGNOSIS — H9190 Unspecified hearing loss, unspecified ear: Secondary | ICD-10-CM | POA: Diagnosis not present

## 2021-07-27 DIAGNOSIS — R69 Illness, unspecified: Secondary | ICD-10-CM | POA: Diagnosis not present

## 2021-07-27 DIAGNOSIS — G9341 Metabolic encephalopathy: Secondary | ICD-10-CM | POA: Diagnosis not present

## 2021-07-27 DIAGNOSIS — N390 Urinary tract infection, site not specified: Secondary | ICD-10-CM | POA: Diagnosis not present

## 2021-07-27 DIAGNOSIS — W06XXXA Fall from bed, initial encounter: Secondary | ICD-10-CM | POA: Diagnosis not present

## 2021-07-27 DIAGNOSIS — R4182 Altered mental status, unspecified: Secondary | ICD-10-CM | POA: Diagnosis not present

## 2021-07-27 DIAGNOSIS — F17210 Nicotine dependence, cigarettes, uncomplicated: Secondary | ICD-10-CM | POA: Diagnosis not present

## 2021-07-27 DIAGNOSIS — I674 Hypertensive encephalopathy: Secondary | ICD-10-CM | POA: Diagnosis not present

## 2021-08-06 ENCOUNTER — Encounter (INDEPENDENT_AMBULATORY_CARE_PROVIDER_SITE_OTHER): Payer: Medicare HMO | Admitting: Ophthalmology

## 2021-08-10 DIAGNOSIS — Z8701 Personal history of pneumonia (recurrent): Secondary | ICD-10-CM | POA: Diagnosis not present

## 2021-08-10 DIAGNOSIS — I674 Hypertensive encephalopathy: Secondary | ICD-10-CM | POA: Diagnosis not present

## 2021-08-10 DIAGNOSIS — I129 Hypertensive chronic kidney disease with stage 1 through stage 4 chronic kidney disease, or unspecified chronic kidney disease: Secondary | ICD-10-CM | POA: Diagnosis not present

## 2021-08-10 DIAGNOSIS — N183 Chronic kidney disease, stage 3 unspecified: Secondary | ICD-10-CM | POA: Diagnosis not present

## 2021-08-10 DIAGNOSIS — B961 Klebsiella pneumoniae [K. pneumoniae] as the cause of diseases classified elsewhere: Secondary | ICD-10-CM | POA: Diagnosis not present

## 2021-08-10 DIAGNOSIS — Z9181 History of falling: Secondary | ICD-10-CM | POA: Diagnosis not present

## 2021-08-10 DIAGNOSIS — R69 Illness, unspecified: Secondary | ICD-10-CM | POA: Diagnosis not present

## 2021-08-10 DIAGNOSIS — N390 Urinary tract infection, site not specified: Secondary | ICD-10-CM | POA: Diagnosis not present

## 2021-08-10 DIAGNOSIS — R001 Bradycardia, unspecified: Secondary | ICD-10-CM | POA: Diagnosis not present

## 2021-08-10 DIAGNOSIS — G928 Other toxic encephalopathy: Secondary | ICD-10-CM | POA: Diagnosis not present

## 2021-08-20 DIAGNOSIS — E876 Hypokalemia: Secondary | ICD-10-CM | POA: Diagnosis not present

## 2021-08-20 DIAGNOSIS — E870 Hyperosmolality and hypernatremia: Secondary | ICD-10-CM | POA: Diagnosis not present

## 2021-08-20 DIAGNOSIS — I129 Hypertensive chronic kidney disease with stage 1 through stage 4 chronic kidney disease, or unspecified chronic kidney disease: Secondary | ICD-10-CM | POA: Diagnosis not present

## 2021-08-20 DIAGNOSIS — R0902 Hypoxemia: Secondary | ICD-10-CM | POA: Diagnosis not present

## 2021-08-20 DIAGNOSIS — J690 Pneumonitis due to inhalation of food and vomit: Secondary | ICD-10-CM | POA: Diagnosis not present

## 2021-08-20 DIAGNOSIS — R69 Illness, unspecified: Secondary | ICD-10-CM | POA: Diagnosis not present

## 2021-08-20 DIAGNOSIS — N183 Chronic kidney disease, stage 3 unspecified: Secondary | ICD-10-CM | POA: Diagnosis not present

## 2021-08-20 DIAGNOSIS — I444 Left anterior fascicular block: Secondary | ICD-10-CM | POA: Diagnosis not present

## 2021-08-20 DIAGNOSIS — R9431 Abnormal electrocardiogram [ECG] [EKG]: Secondary | ICD-10-CM | POA: Diagnosis not present

## 2021-08-20 DIAGNOSIS — R0602 Shortness of breath: Secondary | ICD-10-CM | POA: Diagnosis not present

## 2021-08-20 DIAGNOSIS — E860 Dehydration: Secondary | ICD-10-CM | POA: Diagnosis not present

## 2021-08-20 DIAGNOSIS — N179 Acute kidney failure, unspecified: Secondary | ICD-10-CM | POA: Diagnosis not present

## 2021-08-20 DIAGNOSIS — Z87440 Personal history of urinary (tract) infections: Secondary | ICD-10-CM | POA: Diagnosis not present

## 2021-08-20 DIAGNOSIS — R059 Cough, unspecified: Secondary | ICD-10-CM | POA: Diagnosis not present

## 2021-08-20 DIAGNOSIS — Z20822 Contact with and (suspected) exposure to covid-19: Secondary | ICD-10-CM | POA: Diagnosis not present

## 2021-08-20 DIAGNOSIS — I4510 Unspecified right bundle-branch block: Secondary | ICD-10-CM | POA: Diagnosis not present

## 2021-08-20 DIAGNOSIS — I452 Bifascicular block: Secondary | ICD-10-CM | POA: Diagnosis not present

## 2021-08-20 DIAGNOSIS — N3001 Acute cystitis with hematuria: Secondary | ICD-10-CM | POA: Diagnosis not present

## 2021-08-20 DIAGNOSIS — I160 Hypertensive urgency: Secondary | ICD-10-CM | POA: Diagnosis not present

## 2021-08-20 DIAGNOSIS — R4182 Altered mental status, unspecified: Secondary | ICD-10-CM | POA: Diagnosis not present

## 2021-08-21 DIAGNOSIS — I444 Left anterior fascicular block: Secondary | ICD-10-CM | POA: Diagnosis not present

## 2021-08-21 DIAGNOSIS — R69 Illness, unspecified: Secondary | ICD-10-CM | POA: Diagnosis not present

## 2021-08-21 DIAGNOSIS — R9431 Abnormal electrocardiogram [ECG] [EKG]: Secondary | ICD-10-CM | POA: Diagnosis not present

## 2021-08-21 DIAGNOSIS — E860 Dehydration: Secondary | ICD-10-CM | POA: Diagnosis not present

## 2021-08-21 DIAGNOSIS — N3001 Acute cystitis with hematuria: Secondary | ICD-10-CM | POA: Diagnosis not present

## 2021-08-21 DIAGNOSIS — J698 Pneumonitis due to inhalation of other solids and liquids: Secondary | ICD-10-CM | POA: Diagnosis not present

## 2021-08-21 DIAGNOSIS — I4519 Other right bundle-branch block: Secondary | ICD-10-CM | POA: Diagnosis not present

## 2021-08-21 DIAGNOSIS — E876 Hypokalemia: Secondary | ICD-10-CM | POA: Diagnosis not present

## 2021-08-21 DIAGNOSIS — I129 Hypertensive chronic kidney disease with stage 1 through stage 4 chronic kidney disease, or unspecified chronic kidney disease: Secondary | ICD-10-CM | POA: Diagnosis not present

## 2021-08-21 DIAGNOSIS — N183 Chronic kidney disease, stage 3 unspecified: Secondary | ICD-10-CM | POA: Diagnosis not present

## 2021-08-21 DIAGNOSIS — R4182 Altered mental status, unspecified: Secondary | ICD-10-CM | POA: Diagnosis not present

## 2021-08-22 DIAGNOSIS — J698 Pneumonitis due to inhalation of other solids and liquids: Secondary | ICD-10-CM | POA: Diagnosis not present

## 2021-08-22 DIAGNOSIS — E876 Hypokalemia: Secondary | ICD-10-CM | POA: Diagnosis not present

## 2021-08-22 DIAGNOSIS — N183 Chronic kidney disease, stage 3 unspecified: Secondary | ICD-10-CM | POA: Diagnosis not present

## 2021-08-22 DIAGNOSIS — N3001 Acute cystitis with hematuria: Secondary | ICD-10-CM | POA: Diagnosis not present

## 2021-08-22 DIAGNOSIS — E860 Dehydration: Secondary | ICD-10-CM | POA: Diagnosis not present

## 2021-08-22 DIAGNOSIS — I129 Hypertensive chronic kidney disease with stage 1 through stage 4 chronic kidney disease, or unspecified chronic kidney disease: Secondary | ICD-10-CM | POA: Diagnosis not present

## 2021-08-22 DIAGNOSIS — R4182 Altered mental status, unspecified: Secondary | ICD-10-CM | POA: Diagnosis not present

## 2021-08-22 DIAGNOSIS — R69 Illness, unspecified: Secondary | ICD-10-CM | POA: Diagnosis not present

## 2021-08-23 DIAGNOSIS — N183 Chronic kidney disease, stage 3 unspecified: Secondary | ICD-10-CM | POA: Diagnosis not present

## 2021-08-23 DIAGNOSIS — R4182 Altered mental status, unspecified: Secondary | ICD-10-CM | POA: Diagnosis not present

## 2021-08-23 DIAGNOSIS — J698 Pneumonitis due to inhalation of other solids and liquids: Secondary | ICD-10-CM | POA: Diagnosis not present

## 2021-08-23 DIAGNOSIS — I129 Hypertensive chronic kidney disease with stage 1 through stage 4 chronic kidney disease, or unspecified chronic kidney disease: Secondary | ICD-10-CM | POA: Diagnosis not present

## 2021-08-23 DIAGNOSIS — E876 Hypokalemia: Secondary | ICD-10-CM | POA: Diagnosis not present

## 2021-08-23 DIAGNOSIS — N3001 Acute cystitis with hematuria: Secondary | ICD-10-CM | POA: Diagnosis not present

## 2021-08-23 DIAGNOSIS — E860 Dehydration: Secondary | ICD-10-CM | POA: Diagnosis not present

## 2021-08-23 DIAGNOSIS — R69 Illness, unspecified: Secondary | ICD-10-CM | POA: Diagnosis not present

## 2021-08-24 DIAGNOSIS — J698 Pneumonitis due to inhalation of other solids and liquids: Secondary | ICD-10-CM | POA: Diagnosis not present

## 2021-08-24 DIAGNOSIS — E876 Hypokalemia: Secondary | ICD-10-CM | POA: Diagnosis not present

## 2021-08-24 DIAGNOSIS — N3001 Acute cystitis with hematuria: Secondary | ICD-10-CM | POA: Diagnosis not present

## 2021-08-24 DIAGNOSIS — E860 Dehydration: Secondary | ICD-10-CM | POA: Diagnosis not present

## 2021-08-24 DIAGNOSIS — R69 Illness, unspecified: Secondary | ICD-10-CM | POA: Diagnosis not present

## 2021-08-24 DIAGNOSIS — N183 Chronic kidney disease, stage 3 unspecified: Secondary | ICD-10-CM | POA: Diagnosis not present

## 2021-08-24 DIAGNOSIS — I129 Hypertensive chronic kidney disease with stage 1 through stage 4 chronic kidney disease, or unspecified chronic kidney disease: Secondary | ICD-10-CM | POA: Diagnosis not present

## 2021-08-24 DIAGNOSIS — R4182 Altered mental status, unspecified: Secondary | ICD-10-CM | POA: Diagnosis not present

## 2021-09-29 DEATH — deceased

## 2021-10-13 ENCOUNTER — Other Ambulatory Visit: Payer: Self-pay | Admitting: *Deleted

## 2021-10-13 NOTE — Patient Outreach (Signed)
  Care Coordination   10/13/2021 Name: Kurt Hoogland [name] MRN: [id number] DOB: 06/21/1932   Care Coordination Outreach Attempts:  An unsuccessful telephone outreach was attempted today to offer the patient information about available care coordination services as a benefit of their health plan.   Follow Up Plan:  Additional outreach attempts will be made to offer the patient care coordination information and services.   Encounter Outcome:  No Answer  Care Coordination Interventions Activated:  No   Care Coordination Interventions:  No, not indicated    Valente Baxter, RN, MSN, [facility] Care Coordinator [phone]

## 2021-10-20 ENCOUNTER — Other Ambulatory Visit: Payer: Self-pay | Admitting: *Deleted

## 2021-10-20 NOTE — Patient Outreach (Signed)
  Care Coordination   Initial Visit Note   10/20/2021 Name: Kurt French [facility] Sr. MRN: [id number] DOB: August 10, 1932  Kurt French Sr. is a 88 y.o. year old male who sees Burdine, Virgina Evener, MD for primary care. I     What matters to the patients health and wellness today?  Noted per chart, member is deceased.  No further needs.    SDOH assessments and interventions completed:  No     Care Coordination Interventions Activated:  No  Care Coordination Interventions:  No, not indicated   Follow up plan: No further intervention required.   Encounter Outcome:  Pt. Visit Completed   Valente Brogan, RN, MSN, [facility] Care Coordinator [phone]

## 2022-02-08 IMAGING — CT CT RENAL STONE PROTOCOL
2 of 4 series · 17 of 46 positions shown, 19 images · non-contrast
Comparison: 05/25/2016 from [HOSPITAL]

CLINICAL DATA: Gross hematuria.  Urinary frequency.

EXAM:
CT ABDOMEN AND PELVIS WITHOUT CONTRAST
TECHNIQUE: Multidetector CT imaging of the abdomen and pelvis was performed
following the standard protocol without IV contrast.

[Series 2: axial st · axial · 0.85mm/px · z∈[-467,-47]mm · 14 of 98 slices shown, 16 images]
[im 7/98  soft-tissue]
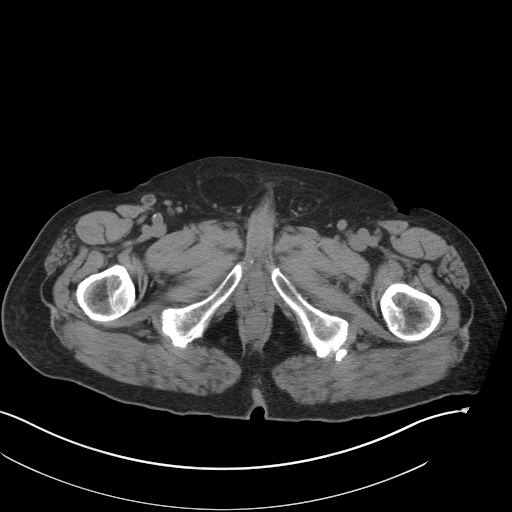
[im 7/98  bone]
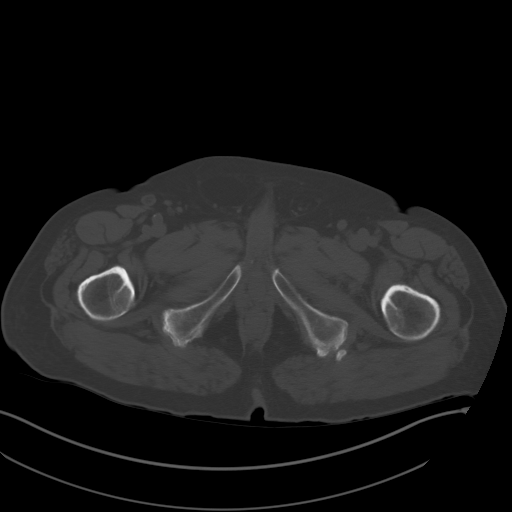
[im 13/98  soft-tissue]
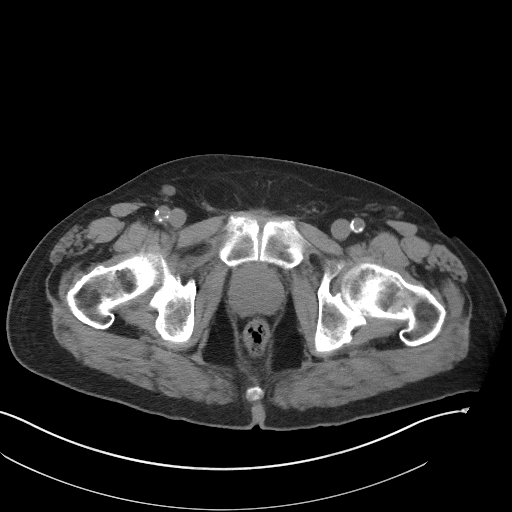
[im 19/98  soft-tissue]
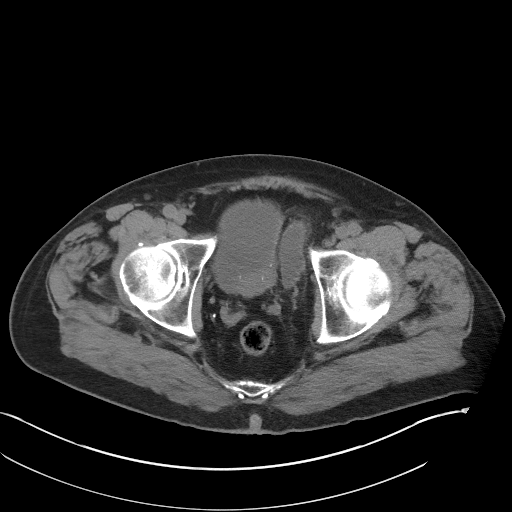
[im 25/98  soft-tissue]
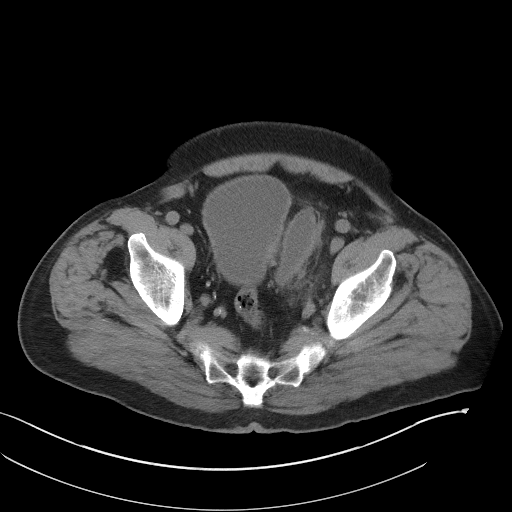
[im 31/98  soft-tissue]
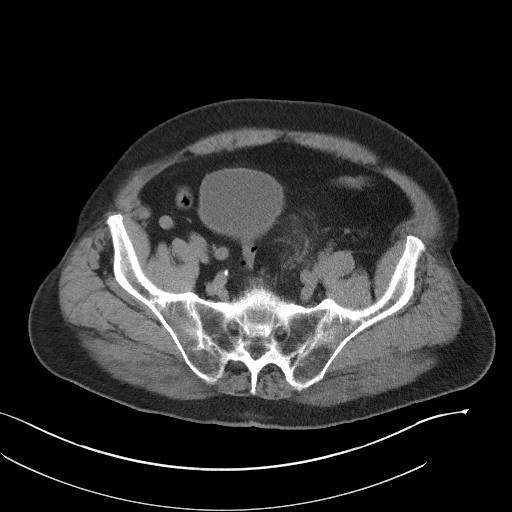
[im 37/98  soft-tissue]
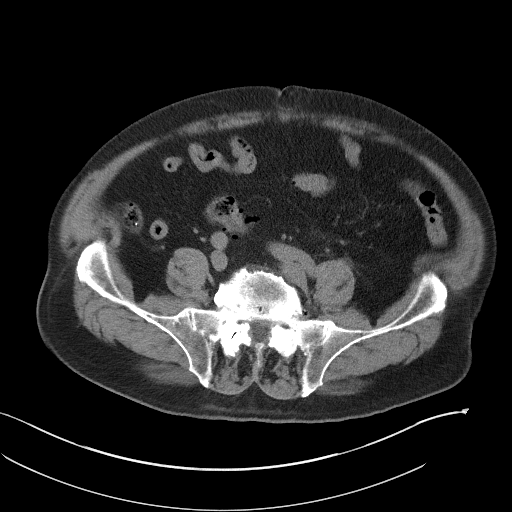
[im 43/98  soft-tissue]
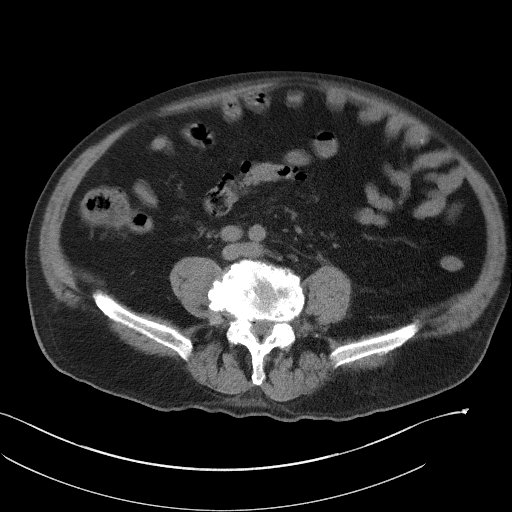
[im 55/98  soft-tissue]
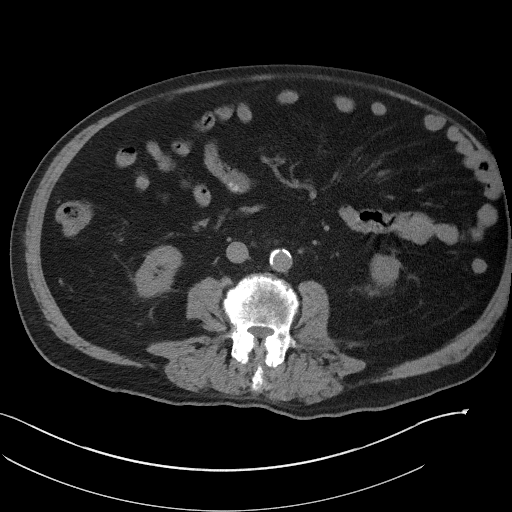
[im 61/98  soft-tissue]
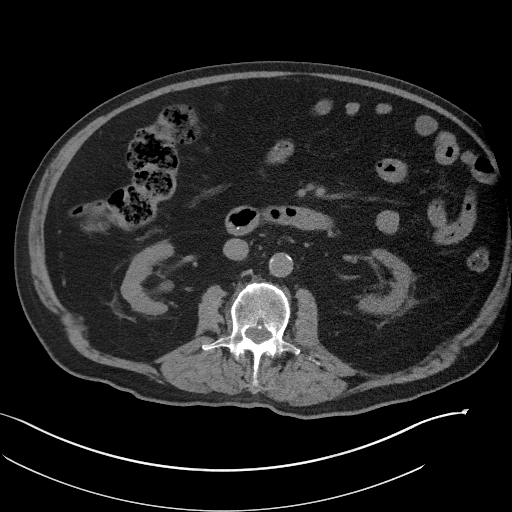
[im 61/98  bone]
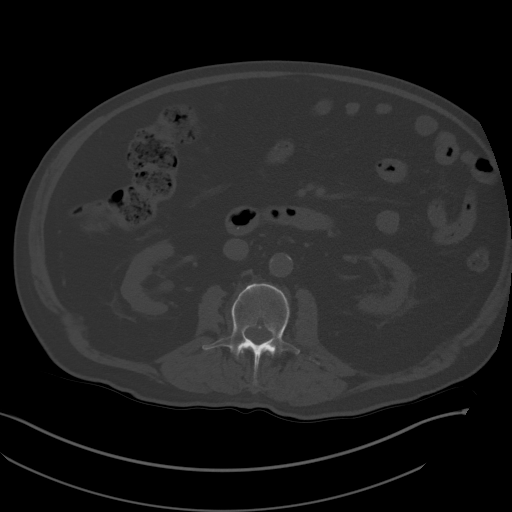
[im 67/98  soft-tissue]
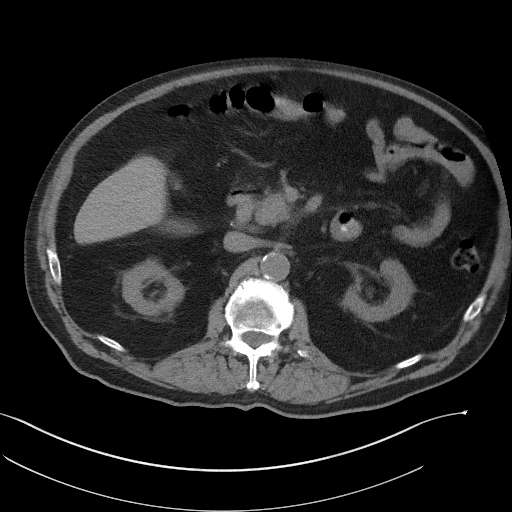
[im 73/98  soft-tissue]
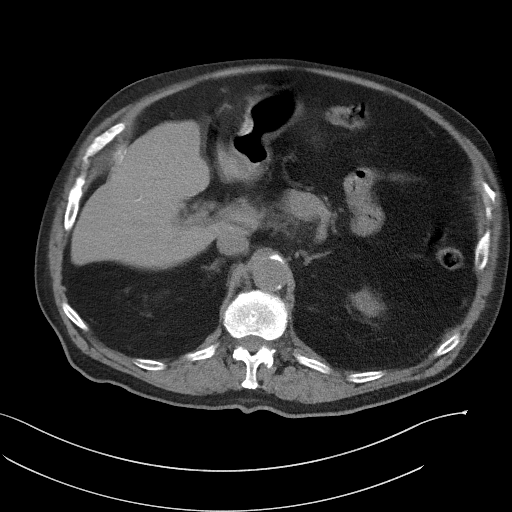
[im 79/98  soft-tissue]
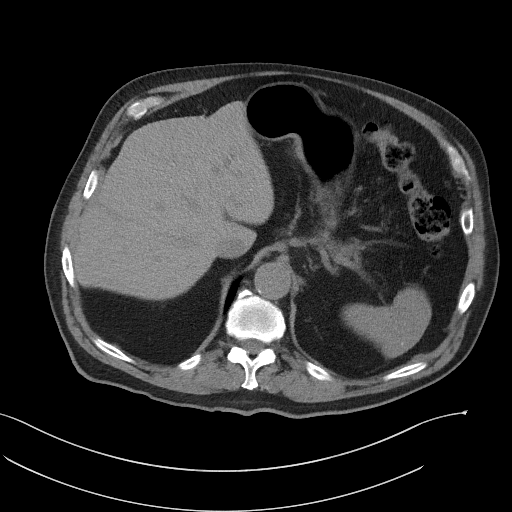
[im 85/98  soft-tissue]
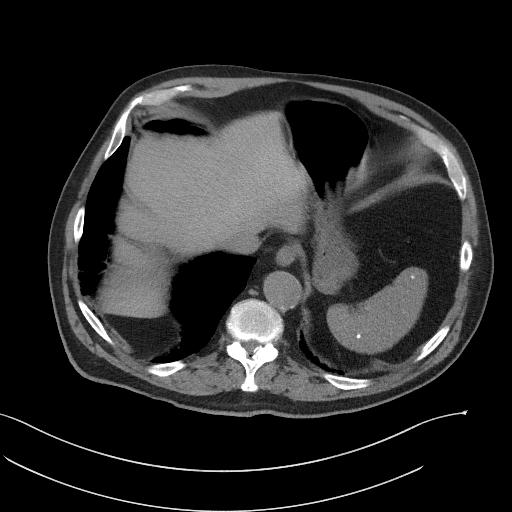
[im 91/98  soft-tissue]
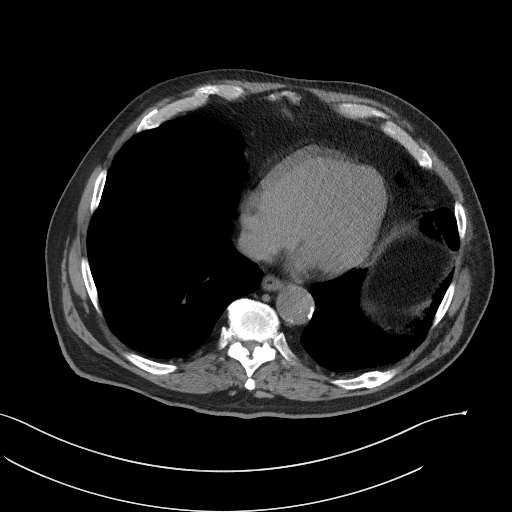

[Series 5: coronal st · coronal · 0.92mm/px · 3 of 109 slices shown]
[im 37/109  soft-tissue]
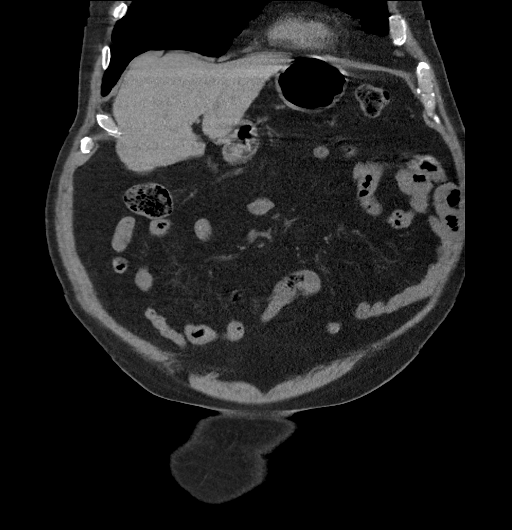
[im 49/109  soft-tissue]
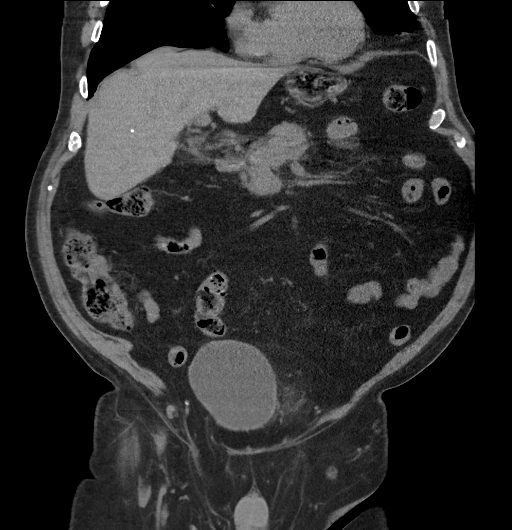
[im 61/109  soft-tissue]
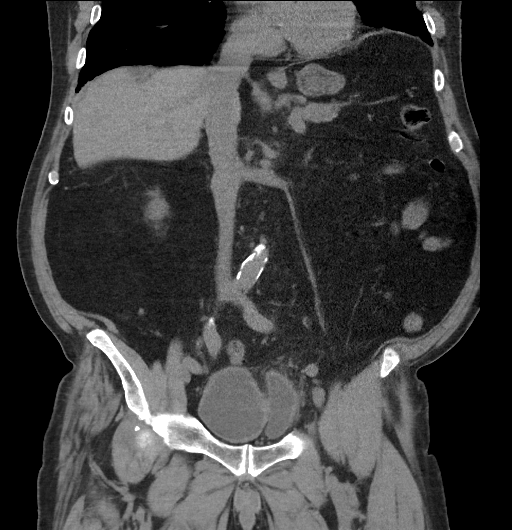

[17 of 46 positions shown; findings below may reference images not displayed]

FINDINGS: Lower chest: No acute findings.

Hepatobiliary: No mass visualized on this unenhanced exam.
Gallbladder is either contracted or surgically absent. No evidence
of biliary ductal dilatation.

Pancreas: No mass or inflammatory process visualized on this
unenhanced exam.

Spleen:  Within normal limits in size.

Adrenals/Urinary tract: A few small fluid attenuation renal cysts
are again seen bilaterally. No evidence of urolithiasis or
hydronephrosis. A large left-sided bladder diverticulum is again
seen. Mild diffuse bladder wall thickening is seen, with soft
tissues stranding along the left lateral bladder wall and
diverticulum which is new since previous study. This is consistent
with cystitis.

Stomach/Bowel: No evidence of obstruction, inflammatory process, or
abnormal fluid collections.

Vascular/Lymphatic: No pathologically enlarged lymph nodes
identified. No evidence of abdominal aortic aneurysm.

Reproductive: Mildly enlarged prostate again noted. Prior Uro-lift
procedure noted.

Other:  Stable small right inguinal hernia containing only fat.

Musculoskeletal:  No suspicious bone lesions identified.
IMPRESSION: Mild diffuse bladder wall thickening, with soft tissue stranding
along the left lateral bladder wall and large left-sided bladder
diverticulum, consistent with cystitis.

No evidence of urolithiasis or hydronephrosis.

Stable mildly enlarged prostate.

Stable small right inguinal hernia containing only fat.
